# Patient Record
Sex: Female | Born: 1990 | Race: Black or African American | Hispanic: No | Marital: Single | Smoking: Never smoker
Health system: Southern US, Community
[De-identification: ages and names within clinical notes are randomized; demographics above are authoritative.]

## PROBLEM LIST (undated history)

## (undated) DIAGNOSIS — Z5189 Encounter for other specified aftercare: Secondary | ICD-10-CM

## (undated) DIAGNOSIS — D649 Anemia, unspecified: Secondary | ICD-10-CM

## (undated) DIAGNOSIS — R51 Headache: Secondary | ICD-10-CM

## (undated) DIAGNOSIS — R011 Cardiac murmur, unspecified: Secondary | ICD-10-CM

## (undated) DIAGNOSIS — H18609 Keratoconus, unspecified, unspecified eye: Secondary | ICD-10-CM

## (undated) DIAGNOSIS — R519 Headache, unspecified: Secondary | ICD-10-CM

## (undated) DIAGNOSIS — I499 Cardiac arrhythmia, unspecified: Secondary | ICD-10-CM

## (undated) DIAGNOSIS — K5190 Ulcerative colitis, unspecified, without complications: Secondary | ICD-10-CM

## (undated) DIAGNOSIS — B009 Herpesviral infection, unspecified: Secondary | ICD-10-CM

## (undated) HISTORY — DX: Cardiac murmur, unspecified: R01.1

## (undated) HISTORY — DX: Encounter for other specified aftercare: Z51.89

## (undated) HISTORY — DX: Keratoconus, unspecified, unspecified eye: H18.609

## (undated) HISTORY — PX: NO PAST SURGERIES: SHX2092

## (undated) HISTORY — PX: OTHER SURGICAL HISTORY: SHX169

---

## 2013-12-31 ENCOUNTER — Emergency Department (HOSPITAL_COMMUNITY)
Admission: EM | Admit: 2013-12-31 | Discharge: 2013-12-31 | Disposition: A | Discharge status: Home / Self Care | Payer: BC Managed Care – PPO | Source: Home / Self Care | Attending: Family Medicine | Admitting: Family Medicine

## 2013-12-31 ENCOUNTER — Encounter (HOSPITAL_COMMUNITY): Payer: Self-pay | Admitting: Emergency Medicine

## 2013-12-31 DIAGNOSIS — J039 Acute tonsillitis, unspecified: Secondary | ICD-10-CM

## 2013-12-31 LAB — POCT RAPID STREP A: Streptococcus, Group A Screen (Direct): NEGATIVE

## 2013-12-31 MED ORDER — CLINDAMYCIN HCL 300 MG PO CAPS: 300.0000 mg | ORAL_CAPSULE | Freq: Three times a day (TID) | ORAL | Status: DC

## 2013-12-31 NOTE — ED Provider Notes (Signed)
CSN: [id number]     Arrival date & time 12/31/13  1115 History   First MD Initiated Contact with Patient 12/31/13 1224     Chief Complaint  Patient presents with  . Sore Throat   (Consider location/radiation/quality/duration/timing/severity/associated sxs/prior Treatment) Patient is a 24 y.o. female presenting with pharyngitis. The history is provided by the patient.  Sore Throat This is a new problem. The current episode started more than 2 days ago. The problem has been gradually worsening. Pertinent negatives include no headaches. The symptoms are aggravated by swallowing.    History reviewed. No pertinent past medical history. History reviewed. No pertinent past surgical history. History reviewed. No pertinent family history. History  Substance Use Topics  . Smoking status: Never Smoker   . Smokeless tobacco: Not on file  . Alcohol Use: Yes   OB History   Grav Para Term Preterm Abortions TAB SAB Ect Mult Living                 Review of Systems  Constitutional: Positive for fever and chills.  HENT: Negative for congestion, postnasal drip and rhinorrhea.   Respiratory: Negative.   Cardiovascular: Negative.   Gastrointestinal: Negative.   Neurological: Negative for headaches.    Allergies  Review of patient's allergies indicates no known allergies.  Home Medications   Current Outpatient Rx  Name  Route  Sig  Dispense  Refill  . clindamycin (CLEOCIN) 300 MG capsule   Oral   Take 1 capsule (300 mg total) by mouth 3 (three) times daily.   21 capsule   0    BP 141/80  Pulse 86  Temp(Src) 98.1 F (36.7 C) (Oral)  Resp 19  SpO2 98%  LMP 12/11/2013 Physical Exam  Nursing note and vitals reviewed. Constitutional: She is oriented to person, place, and time. She appears well-developed and well-nourished.  HENT:  Head: Normocephalic.  Right Ear: External ear normal.  Left Ear: External ear normal.  Mouth/Throat: Oropharyngeal exudate present.  Right tonsillar  exudate, necrotic., foul odor.  Eyes: Conjunctivae are normal. Pupils are equal, round, and reactive to light.  Neck: Normal range of motion. Neck supple.  Cardiovascular: Regular rhythm.   Pulmonary/Chest: Breath sounds normal.  Lymphadenopathy:    She has cervical adenopathy.  Neurological: She is alert and oriented to person, place, and time.  Skin: Skin is warm and dry.    ED Course  Procedures (including critical care time) Labs Review Labs Reviewed  POCT RAPID STREP A ([facility] CARE ONLY)   Imaging Review No results found.  EKG Interpretation    Date/Time:    Ventricular Rate:    PR Interval:    QRS Duration:   QT Interval:    QTC Calculation:   R Axis:     Text Interpretation:              MDM      Billy Fischer, MD 12/31/13 1244

## 2013-12-31 NOTE — Discharge Instructions (Signed)
Drink plenty of fluids as discussed, use medicine as prescribed,  Return or see your doctor if further problems

## 2013-12-31 NOTE — ED Notes (Signed)
Pt  Reports  Symptoms  Of  sorethroat    Pain  When  She  Swallows   As   Well as   Some  Swollen glands  On the  r       Symptoms  X  5  Days    The  Pt throat   Is  Red

## 2014-01-02 LAB — CULTURE, GROUP A STREP

## 2014-01-02 NOTE — ED Notes (Signed)
Throat culture: Strep beta hemolytic not group A.  Pt. adequately treated with Cleocin. I will notify pt. Roselyn Meier 01/02/2014

## 2014-01-03 ENCOUNTER — Telehealth (HOSPITAL_COMMUNITY): Payer: Self-pay | Admitting: *Deleted

## 2014-01-03 NOTE — ED Notes (Signed)
I called pt.  Pt. verified x 2 and given results.  Pt. told she was adequately treated for Strep with Cleocin and to finish all of medication. Come back if not better after the medication. Notify anyone she exposed, that if they get the same symptoms, they should get checked for strep. Pt. voiced understanding. Roselyn Meier 01/03/2014

## 2015-11-22 ENCOUNTER — Encounter (HOSPITAL_COMMUNITY): Payer: Self-pay | Admitting: *Deleted

## 2015-11-22 ENCOUNTER — Inpatient Hospital Stay (HOSPITAL_COMMUNITY)
Admission: AD | Admit: 2015-11-22 | Discharge: 2015-11-23 | DRG: 812 | Disposition: A | Discharge status: Home / Self Care | Payer: BLUE CROSS/BLUE SHIELD | Source: Ambulatory Visit | Attending: Gastroenterology | Admitting: Gastroenterology

## 2015-11-22 DIAGNOSIS — D649 Anemia, unspecified: Secondary | ICD-10-CM | POA: Diagnosis present

## 2015-11-22 DIAGNOSIS — K921 Melena: Secondary | ICD-10-CM | POA: Diagnosis present

## 2015-11-22 HISTORY — DX: Cardiac arrhythmia, unspecified: I49.9

## 2015-11-22 HISTORY — DX: Herpesviral infection, unspecified: B00.9

## 2015-11-22 LAB — CBC
HCT: 14.9 % — ABNORMAL LOW (ref 36.0–46.0)
Hemoglobin: 4 g/dL — CL (ref 12.0–15.0)
MCH: 15.7 pg — ABNORMAL LOW (ref 26.0–34.0)
MCHC: 26.8 g/dL — ABNORMAL LOW (ref 30.0–36.0)
MCV: 58.4 fL — ABNORMAL LOW (ref 78.0–100.0)
Platelets: 173 K/uL (ref 150–400)
RBC: 2.55 MIL/uL — ABNORMAL LOW (ref 3.87–5.11)
RDW: 20.1 % — ABNORMAL HIGH (ref 11.5–15.5)
WBC: 6.6 K/uL (ref 4.0–10.5)

## 2015-11-22 LAB — PREPARE RBC (CROSSMATCH)

## 2015-11-22 MED ORDER — SODIUM CHLORIDE 0.9 % IV SOLN: Freq: Once | INTRAVENOUS | Status: DC

## 2015-11-22 MED ORDER — SODIUM CHLORIDE 0.9 % IV SOLN
INTRAVENOUS | Status: DC
  Administered 2015-11-22: 17:00:00 via INTRAVENOUS

## 2015-11-22 MED ORDER — PANTOPRAZOLE SODIUM 40 MG PO TBEC
40.0000 mg | DELAYED_RELEASE_TABLET | Freq: Every day | ORAL | Status: DC
  Administered 2015-11-22 – 2015-11-23 (×2): 40 mg via ORAL
  Filled 2015-11-22 (×2): qty 1

## 2015-11-22 MED ORDER — ACETAMINOPHEN 325 MG PO TABS
650.0000 mg | ORAL_TABLET | Freq: Once | ORAL | Status: AC
  Administered 2015-11-22: 650 mg via ORAL
  Filled 2015-11-22: qty 2

## 2015-11-22 MED ORDER — DIPHENHYDRAMINE HCL 25 MG PO CAPS
25.0000 mg | ORAL_CAPSULE | Freq: Once | ORAL | Status: AC
  Administered 2015-11-22: 25 mg via ORAL
  Filled 2015-11-22: qty 1

## 2015-11-22 NOTE — H&P (Signed)
  Carolyn Blake HPI: For the past three weeks she reports issues with melena. PO intake will stimulate a bowel movement and she had black stools that will be formed and unformed. At times she may have 3-4 melenic bowel movements per day. No significant abdominal pain, nausea, vomiting, or constipation. She reports having some diarrheal bowel movements. Since the age of 25 she reports hematochezia that was evident in the stools and on the toilet tissue. At [facility] she had blood work, but she cannot recall the results. This was performed two weeks ago. She denies taking NSAIDs or Peptobismol. Her father has Crohn's disease and a maternal aunt may have UC. Per the patient's report, her rectal examination was positive for blood. Unfortunately she was still menstruating at the time of the examination. There is no known family history of colon cancer.  Her blood work was obtained yesterday in the office and the lab called this morning reporting a value of 4.2 g/dL.  No past medical history on file.  No past surgical history on file.  No family history on file.  Social History:  reports that she has never smoked. She does not have any smokeless tobacco history on file. She reports that she drinks alcohol. Her drug history is not on file.  Allergies: No Known Allergies  Medications:  Scheduled: . sodium chloride   Intravenous Once  . acetaminophen  650 mg Oral Once  . diphenhydrAMINE  25 mg Oral Once  . pantoprazole  40 mg Oral Daily   Continuous: . sodium chloride      No results found for this or any previous visit (from the past 24 hour(s)).   No results found.  ROS:  As stated above in the HPI otherwise negative.  Blood pressure 123/73, pulse 87, resp. rate 19, SpO2 100 %.    PE: Gen: NAD, Alert and Oriented HEENT:  [place]/AT, EOMI Neck: Supple, no LAD Lungs: CTA Bilaterally CV: RRR without M/G/R ABM: Soft, NTND, +BS Ext: No C/C/E Rectal: Heme positive brown stool  (yesterday's examination in the office.)  Assessment/Plan: 1) Severe IDA. 2) Melena. 3) Hematochezia.   Surprisingly she is asymptomatic from such a low value for her HGB.  She is already set up for an EGD/Colonoscopy.  The first order of business is to transfuse her.  She will be provided 3 units of PRBC and then she can be safely discharged home.  The plan is for endoscopic procedure next week.  Plan: 1) Transfuse 3 units of PRBC.  Carolyn Blake D 11/22/2015, 3:25 PM

## 2015-11-22 NOTE — Progress Notes (Signed)
Utilization review completed.  L. J. Cyrene Gharibian RN, BSN, CM 

## 2015-11-22 NOTE — Progress Notes (Signed)
CRITICAL VALUE ALERT  Critical value received:  4.0 HGB  Date of notification: 11/23/2015  Time of notification:  1748  Critical value read back: yes Nurse who received alert:  Keirston Saephanh  MD notified (1st page):md AWARE 3 UNITS OF PBRC  Time of first page:  MD notified (2nd page):  Time of second page:  Responding MD:   Time MD responded:

## 2015-11-23 LAB — CBC
HCT: 21 % — ABNORMAL LOW (ref 36.0–46.0)
Hemoglobin: 6.4 g/dL — CL (ref 12.0–15.0)
MCH: 20.1 pg — ABNORMAL LOW (ref 26.0–34.0)
MCHC: 30.5 g/dL (ref 30.0–36.0)
MCV: 66 fL — ABNORMAL LOW (ref 78.0–100.0)
Platelets: 182 10*3/uL (ref 150–400)
RBC: 3.18 MIL/uL — ABNORMAL LOW (ref 3.87–5.11)
RDW: 25.8 % — ABNORMAL HIGH (ref 11.5–15.5)
WBC: 5.5 10*3/uL (ref 4.0–10.5)

## 2015-11-23 NOTE — Progress Notes (Signed)
Reviewed discharge paperwork.  PIV removed.  Pt denied any needs at this time.  Pt taken to discharge location via wheelchair.

## 2015-11-23 NOTE — Progress Notes (Signed)
Released blood order and received phone call from blood bank stating that the 3rd unit of blood is not ready and will have to be received from a sister hospital.  Informed Dr. Benson Norway.  Will order CBC and discharge patient if Hgb 7.0 or greater.  Will inform patient and continue to monitor patient.

## 2015-11-23 NOTE — Care Management Note (Signed)
Case Management Note  Patient Details  Name: Jullianna Gabor MRN: [id number] Date of Birth: [birth year]  Subjective/Objective:                  Spoke with patient, she states that she plans on [facility], she is a Ship broker at [employer]. She drives and is covered with BCBS. She denies any diffuculties obtaining her medications.    Action/Plan:  No further CM needs identified at this time.  Expected Discharge Date:                  Expected Discharge Plan:  Home/Self Care  In-House Referral:     Discharge planning Services  CM Consult  Post Acute Care Choice:    Choice offered to:     DME Arranged:    DME Agency:     HH Arranged:    [place] Agency:     Status of Service:  Completed, signed off  Medicare Important Message Given:    Date Medicare IM Given:    Medicare IM give by:    Date Additional Medicare IM Given:    Additional Medicare Important Message give by:     If discussed at [place] of Stay Meetings, dates discussed:    Additional Comments:  Carles Collet, RN 11/23/2015, 11:04 AM

## 2015-11-23 NOTE — Discharge Summary (Signed)
Physician Discharge Summary  Patient ID: Carolyn Blake MRN: [id number] DOB/AGE: 08-04-1990 25 y.o.  Admit date: 11/22/2015 Discharge date: 11/23/2015  Admission Diagnoses: Severe Anemia.                                         Heme positive stool.  Discharge Diagnoses: Same Active Problems:   Anemia   Discharged Condition: good  Hospital Course: The patient's HGB was at 4.2 g/dL from the office, which precipitated the admission.  Her CBC in the hospital confirmed the value at 4.0 g/dL.  She was transfused with 3 units of PRBC, but it was difficult to type and crossmatch her blood.  She had warm antibodies.  No issues with the actual transfusion.  A CBC after two units of PRBC showed an appropriate increase into the 6 range.  The patient was asymptomatic from the anemia and she did not report any improvement after the transfusion.  With her history of melena Protonix was initiated and an outpatient prescription for omeprazole was [facility] [address].  She is scheduled to have an EGD/Colonscopy this coming Tuesday.  Consults: None  Significant Diagnostic Studies: labs: CBC  Treatments: blood transfusions  Discharge Exam: Blood pressure 110/54, pulse 81, temperature 98.2 F (36.8 C), temperature source Oral, resp. rate 18, height 5' 4"  (1.626 m), weight 69.31 kg (152 lb 12.8 oz), SpO2 100 %. General appearance: alert and no distress Resp: clear to auscultation bilaterally Cardio: regular rate and rhythm GI: soft, non-tender; bowel sounds normal; no masses,  no organomegaly Extremities: extremities normal, atraumatic, no cyanosis or edema  Disposition: 01-Home or Self Care     Medication List    TAKE these medications        valACYclovir 500 MG tablet  Commonly known as:  VALTREX  Take 1 tablet by mouth 2 (two) times daily.         SignedCarol Ada D 11/23/2015, 3:34 PM

## 2015-11-24 LAB — TYPE AND SCREEN
ABO/RH(D): O POS
Antibody Screen: POSITIVE
DAT, IgG: POSITIVE
Donor AG Type: NEGATIVE
Donor AG Type: NEGATIVE
Donor AG Type: NEGATIVE
Unit division: 0
Unit division: 0
Unit division: 0
Unit division: 0

## 2015-11-26 ENCOUNTER — Encounter (HOSPITAL_COMMUNITY): Payer: Self-pay | Admitting: Neurology

## 2015-11-26 ENCOUNTER — Emergency Department (HOSPITAL_COMMUNITY): Payer: BLUE CROSS/BLUE SHIELD

## 2015-11-26 ENCOUNTER — Emergency Department (HOSPITAL_COMMUNITY)
Admission: EM | Admit: 2015-11-26 | Discharge: 2015-11-26 | Disposition: A | Discharge status: Home / Self Care | Payer: BLUE CROSS/BLUE SHIELD | Attending: Emergency Medicine | Admitting: Emergency Medicine

## 2015-11-26 DIAGNOSIS — R1013 Epigastric pain: Secondary | ICD-10-CM | POA: Diagnosis not present

## 2015-11-26 DIAGNOSIS — Z8679 Personal history of other diseases of the circulatory system: Secondary | ICD-10-CM | POA: Diagnosis not present

## 2015-11-26 DIAGNOSIS — R197 Diarrhea, unspecified: Secondary | ICD-10-CM | POA: Insufficient documentation

## 2015-11-26 DIAGNOSIS — Z3202 Encounter for pregnancy test, result negative: Secondary | ICD-10-CM | POA: Insufficient documentation

## 2015-11-26 DIAGNOSIS — R195 Other fecal abnormalities: Secondary | ICD-10-CM | POA: Insufficient documentation

## 2015-11-26 DIAGNOSIS — Z79899 Other long term (current) drug therapy: Secondary | ICD-10-CM | POA: Diagnosis not present

## 2015-11-26 DIAGNOSIS — Z8619 Personal history of other infectious and parasitic diseases: Secondary | ICD-10-CM | POA: Diagnosis not present

## 2015-11-26 DIAGNOSIS — Z862 Personal history of diseases of the blood and blood-forming organs and certain disorders involving the immune mechanism: Secondary | ICD-10-CM | POA: Diagnosis not present

## 2015-11-26 LAB — URINALYSIS, ROUTINE W REFLEX MICROSCOPIC
Bilirubin Urine: NEGATIVE
Glucose, UA: NEGATIVE mg/dL
Hgb urine dipstick: NEGATIVE
Ketones, ur: NEGATIVE mg/dL
Nitrite: NEGATIVE
Protein, ur: NEGATIVE mg/dL
Specific Gravity, Urine: 1.015 (ref 1.005–1.030)
pH: 6 (ref 5.0–8.0)

## 2015-11-26 LAB — URINE MICROSCOPIC-ADD ON: Bacteria, UA: NONE SEEN

## 2015-11-26 LAB — CBC
HCT: 24.4 % — ABNORMAL LOW (ref 36.0–46.0)
Hemoglobin: 7.5 g/dL — ABNORMAL LOW (ref 12.0–15.0)
MCH: 21.7 pg — ABNORMAL LOW (ref 26.0–34.0)
MCHC: 30.7 g/dL (ref 30.0–36.0)
MCV: 70.7 fL — ABNORMAL LOW (ref 78.0–100.0)
Platelets: 167 10*3/uL (ref 150–400)
RBC: 3.45 MIL/uL — ABNORMAL LOW (ref 3.87–5.11)
RDW: 27.9 % — ABNORMAL HIGH (ref 11.5–15.5)
WBC: 6.2 10*3/uL (ref 4.0–10.5)

## 2015-11-26 LAB — COMPREHENSIVE METABOLIC PANEL
ALT: 9 U/L — ABNORMAL LOW (ref 14–54)
AST: 15 U/L (ref 15–41)
Albumin: 2.6 g/dL — ABNORMAL LOW (ref 3.5–5.0)
Alkaline Phosphatase: 59 U/L (ref 38–126)
Anion gap: 4 — ABNORMAL LOW (ref 5–15)
BUN: <5 mg/dL — ABNORMAL LOW (ref 6–20)
CO2: 24 mmol/L (ref 22–32)
Calcium: 8.3 mg/dL — ABNORMAL LOW (ref 8.9–10.3)
Chloride: 109 mmol/L (ref 101–111)
Creatinine, Ser: 0.57 mg/dL (ref 0.44–1.00)
GFR calc Af Amer: >60 mL/min (ref >60)
GFR calc non Af Amer: >60 mL/min (ref >60)
Glucose, Bld: 87 mg/dL (ref 65–99)
Potassium: 3.3 mmol/L — ABNORMAL LOW (ref 3.5–5.1)
Sodium: 137 mmol/L (ref 135–145)
Total Bilirubin: 0.2 mg/dL — ABNORMAL LOW (ref 0.3–1.2)
Total Protein: 6 g/dL — ABNORMAL LOW (ref 6.5–8.1)

## 2015-11-26 LAB — I-STAT BETA HCG BLOOD, ED (MC, WL, AP ONLY): I-stat hCG, quantitative: <5 m[IU]/mL (ref <5)

## 2015-11-26 LAB — LIPASE, BLOOD: Lipase: 183 U/L — ABNORMAL HIGH (ref 11–51)

## 2015-11-26 MED ORDER — IOHEXOL 300 MG/ML  SOLN
100.0000 mL | Freq: Once | INTRAMUSCULAR | Status: AC | PRN
  Administered 2015-11-26: 100 mL via INTRAVENOUS

## 2015-11-26 MED ORDER — MORPHINE SULFATE (PF) 2 MG/ML IV SOLN
2.0000 mg | Freq: Once | INTRAVENOUS | Status: AC
  Administered 2015-11-26: 2 mg via INTRAVENOUS
  Filled 2015-11-26: qty 1

## 2015-11-26 MED ORDER — SODIUM CHLORIDE 0.9 % IV BOLUS (SEPSIS)
1000.0000 mL | Freq: Once | INTRAVENOUS | Status: AC
  Administered 2015-11-26: 1000 mL via INTRAVENOUS

## 2015-11-26 MED ORDER — IOHEXOL 300 MG/ML  SOLN: 25.0000 mL | INTRAMUSCULAR | Status: AC

## 2015-11-26 MED ORDER — ONDANSETRON HCL 4 MG PO TABS
4.0000 mg | ORAL_TABLET | Freq: Once | ORAL | Status: AC
  Administered 2015-11-26: 4 mg via ORAL
  Filled 2015-11-26: qty 1

## 2015-11-26 NOTE — ED Provider Notes (Signed)
CSN: [id number]     Arrival date & time 11/26/15  0840 History   First MD Initiated Contact with Patient 11/26/15 [phone]     Chief Complaint  Patient presents with  . Abdominal Pain   (Consider location/radiation/quality/duration/timing/severity/associated sxs/prior Treatment)  HPI  Patient is a 25 year old female with history of anemia who presents with 2 days of epigastric abdominal pain. Patient was admitted to the hospital 4 days ago with severe anemia and melena. She was discharged 3 days ago and schedule to have an EGD and colonoscopy with GI. Patient reports that the pain started after being discharged from the hospital. Pain is described as a sharp, crampy, burning sensation in her epigastric area that occurs after eating. Pain will last for 15-20 minutes then subside. No nausea or vomiting. Last BM yesterday. Has had chronic diarrhea that is not changed from her baseline. Patient has not tried any medications for the pain.   Past Medical History  Diagnosis Date  . Irregular heart beat   . HSV-2 (herpes simplex virus 2) infection    History reviewed. No pertinent past surgical history. No family history on file. Social History  Substance Use Topics  . Smoking status: Never Smoker   . Smokeless tobacco: None  . Alcohol Use: Yes   OB History    No data available     Review of Systems  Constitutional: Negative for fever and chills.  HENT: Negative.   Eyes: Negative.   Respiratory: Negative.   Cardiovascular: Negative.   Gastrointestinal: Positive for abdominal pain, diarrhea and blood in stool. Negative for nausea and vomiting.  Endocrine: Negative.   Genitourinary: Negative.   Musculoskeletal: Negative.   Skin: Negative.   Allergic/Immunologic: Negative.   Neurological: Negative.   Hematological: Negative.   Psychiatric/Behavioral: Negative.    Allergies  Review of patient's allergies indicates no known allergies.  Home Medications   Prior to Admission  medications   Medication Sig Start Date End Date Taking? Authorizing Provider  omeprazole (PRILOSEC) 40 MG capsule Take 40 mg by mouth daily. 11/23/15   Historical Provider, MD  valACYclovir (VALTREX) 500 MG tablet Take 1 tablet by mouth 2 (two) times daily. 10/28/15   Historical Provider, MD   BP 125/84 mmHg  Pulse 56  Temp(Src) 98.2 F (36.8 C) (Oral)  Resp 18  SpO2 100%  LMP 10/03/2015 Physical Exam  Constitutional: She is oriented to person, place, and time. She appears well-developed and well-nourished. No distress.  HENT:  Head: Normocephalic and atraumatic.  Eyes: EOM are normal. Pupils are equal, round, and reactive to light.  Neck: Normal range of motion. Neck supple.  Cardiovascular: Normal rate and regular rhythm.   No murmur heard. Pulmonary/Chest: Effort normal and breath sounds normal. No respiratory distress.  Abdominal: Soft. Bowel sounds are normal. She exhibits no distension and no mass. There is no tenderness. There is no rebound and no guarding.  Musculoskeletal: Normal range of motion. She exhibits no edema or tenderness.  Neurological: She is alert and oriented to person, place, and time. No cranial nerve deficit.  Skin: Skin is warm and dry. No erythema.  Psychiatric: She has a normal mood and affect. Her behavior is normal.  Nursing note and vitals reviewed.   ED Course  Procedures (including critical care time) Labs Review Labs Reviewed  LIPASE, BLOOD - Abnormal; Notable for the following:    Lipase 183 (*)    All other components within normal limits  COMPREHENSIVE METABOLIC PANEL - Abnormal; Notable  for the following:    Potassium 3.3 (*)    BUN <5 (*)    Calcium 8.3 (*)    Total Protein 6.0 (*)    Albumin 2.6 (*)    ALT 9 (*)    Total Bilirubin 0.2 (*)    Anion gap 4 (*)    All other components within normal limits  CBC - Abnormal; Notable for the following:    RBC 3.45 (*)    Hemoglobin 7.5 (*)    HCT 24.4 (*)    MCV 70.7 (*)    MCH 21.7  (*)    RDW 27.9 (*)    All other components within normal limits  URINALYSIS, ROUTINE W REFLEX MICROSCOPIC (NOT AT [facility]) - Abnormal; Notable for the following:    Leukocytes, UA TRACE (*)    All other components within normal limits  URINE MICROSCOPIC-ADD ON - Abnormal; Notable for the following:    Squamous Epithelial / LPF 0-5 (*)    All other components within normal limits  I-STAT BETA HCG BLOOD, ED ([facility], [facility], [facility] ONLY)    Imaging Review Ct Abdomen Pelvis W Contrast  11/26/2015  CLINICAL DATA:  Mid to upper abdominal pain 2 days. Pain with eating and bowel movements. EXAM: CT ABDOMEN AND PELVIS WITH CONTRAST TECHNIQUE: Multidetector CT imaging of the abdomen and pelvis was performed using the standard protocol following bolus administration of intravenous contrast. CONTRAST:  191m OMNIPAQUE IOHEXOL 300 MG/ML  SOLN COMPARISON:  None. FINDINGS: Lung bases demonstrate minimal dependent atelectatic change. Abdominal images demonstrate mild periportal edema. Small amount of perihepatic fluid. Possible subtle gallbladder wall thickening likely secondary to adjacent mild ascites. The spleen, pancreas, adrenal glands, stomach and kidneys are normal. Two small adjacent splenules. Appendix is normal. Small amount of free fluid in the pericolic gutters bilaterally. Mild fecal retention throughout the colon. Small bowel is within normal. Pelvic images demonstrate the bladder, uterus and ovaries to be within normal. Minimal fluid in the cul-de-sac. Mild fecal retention over the rectum. Subtle subcutaneous edema over the abdomen/pelvis. Remaining bones and soft tissues are within normal. IMPRESSION: Minimal ascites. Possible subtle gallbladder wall thickening likely secondary to the mild ascites. Mild nonspecific periportal edema. Electronically Signed   By: DMarin OlpM.D.   On: 11/26/2015 12:12   [place]Abdomen Limited Ruq  11/26/2015  CLINICAL DATA:  Epigastric pain EXAM: [place]DOMEN LIMITED - RIGHT UPPER  QUADRANT COMPARISON:  None. FINDINGS: Gallbladder: No gallstones are noted within gallbladder. No thickening of gallbladder wall. Small pericholecystic fluid. No sonographic Murphy's sign. Common bile duct: Diameter: 1.2 mm in diameter within normal limits. Liver: No focal lesion identified. Within normal limits in parenchymal echogenicity. IMPRESSION: No gallstones are noted within gallbladder. Normal CBD. No thickening of gallbladder wall. Small pericholecystic fluid. Electronically Signed   By: LLahoma CrockerM.D.   On: 11/26/2015 12:49   I have personally reviewed and evaluated these images and lab results as part of my medical decision-making.   EKG Interpretation None      MDM   Final diagnoses:  Epigastric pain   Patient is a 25year old female presenting with 2 days of epigastric pain after eating. Patient has a benign abdominal exam. Work up notable for elevated lipase to 183. CT abdomen showed normal pancreas with minimal ascites. RUQ ultrasound with normal gallbladder wall and no gallstones. Patient without further episodes of pain in the ED. Vital signs within normal limits. Unclear etiology for patient's epigastric pain and elevated lipase,  though possibly stress reaction from recent admission for profound anemia. Patient may have peptic ulcer which could also contribute to her epigastric pain. Patient has follow up with GI in 3 days for EGD and colonoscopy. Instructed her to follow up with her PCP and GI if she continues to have abdominal pain. Will discharge home. Return precautions reviewed.    Vivi Barrack, MD 11/26/15 1326  Carmin Muskrat, MD 11/26/15 [phone]

## 2015-11-26 NOTE — ED Notes (Signed)
Pt reports mid abd pain for 2 days that hurts when she eats and has BM. Was admitted to the hospital earlier this week, thinks she may have ulcer waiting for colonoscopy.

## 2015-11-26 NOTE — Discharge Instructions (Signed)

## 2016-01-16 ENCOUNTER — Ambulatory Visit: Payer: Self-pay | Admitting: Gastroenterology

## 2016-02-10 IMAGING — CT CT ABD-PELV W/ CM
2 of 4 series · 11 of 46 positions shown, 12 images · IV contrast (Iodine)
Comparison: None.

CLINICAL DATA: Mid to upper abdominal pain 2 days. Pain with eating
and bowel movements.

EXAM:
CT ABDOMEN AND PELVIS WITH CONTRAST
TECHNIQUE: Multidetector CT imaging of the abdomen and pelvis was performed
using the standard protocol following bolus administration of
intravenous contrast.
CONTRAST:  100mL OMNIPAQUE IOHEXOL 300 MG/ML  SOLN

[Series 201: routine, idose (2) · axial · 0.78mm/px · z∈[-665,-265]mm · 8 of 96 slices shown, 9 images]
[im 8/96  soft-tissue]
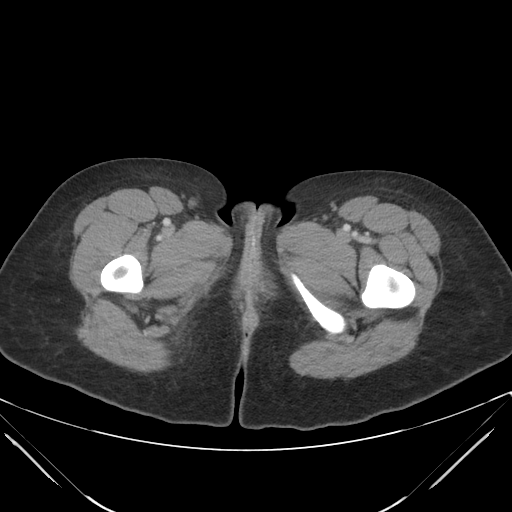
[im 8/96  bone]
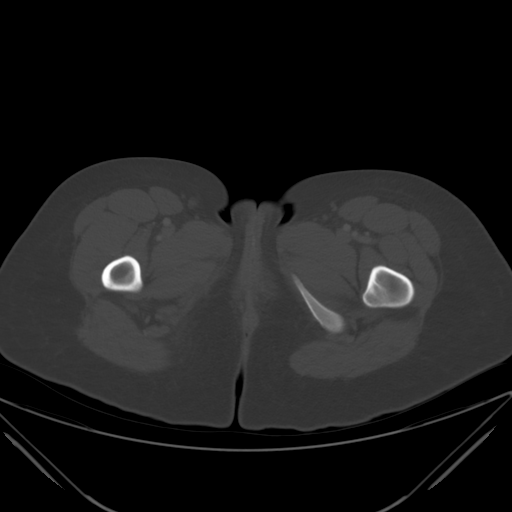
[im 20/96  soft-tissue]
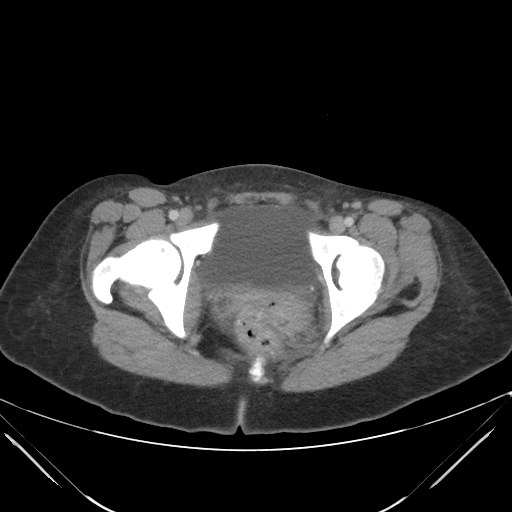
[im 31/96  soft-tissue]
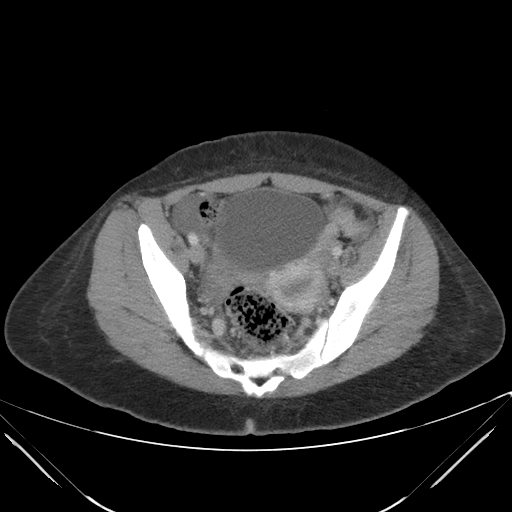
[im 42/96  soft-tissue]
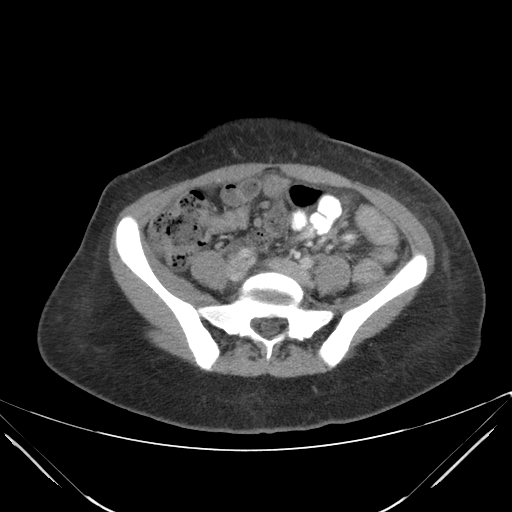
[im 54/96  soft-tissue]
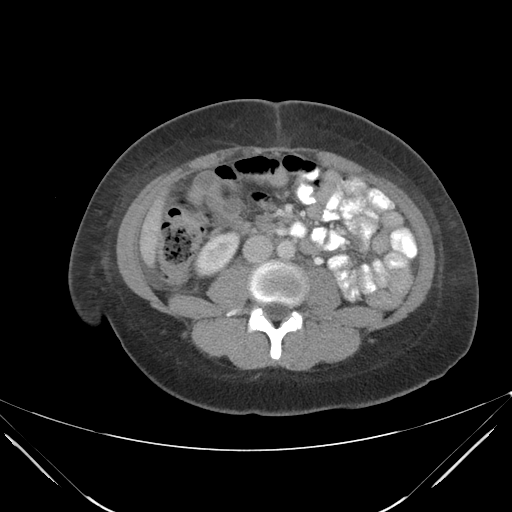
[im 65/96  soft-tissue]
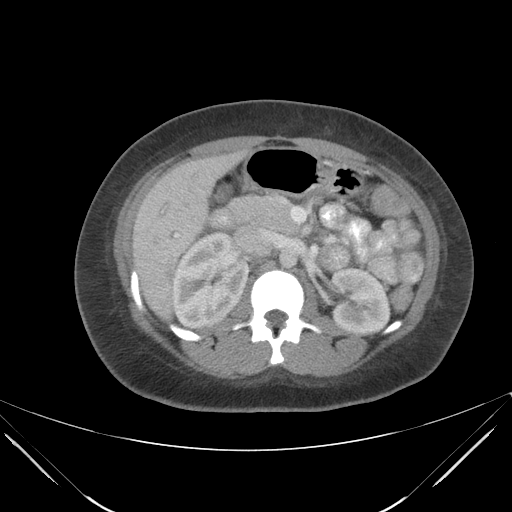
[im 77/96  soft-tissue]
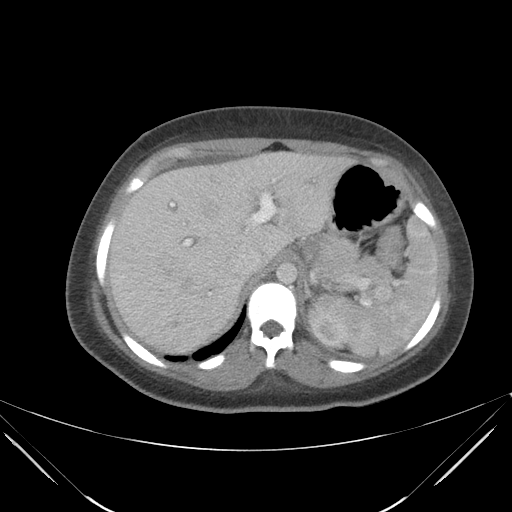
[im 88/96  soft-tissue]
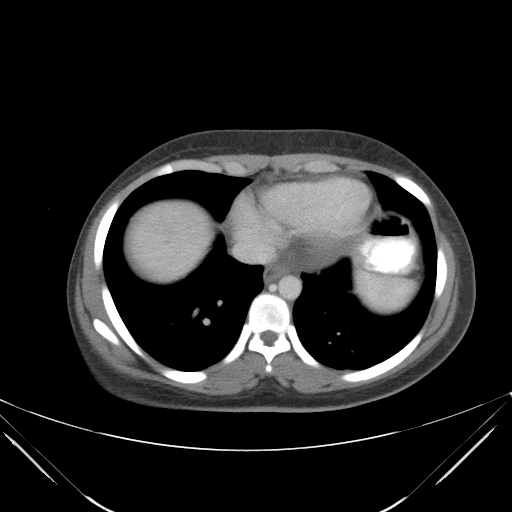

[Series 203: coronals, idose (2) · coronal · 0.45mm/px · 3 of 119 slices shown]
[im 40/119  soft-tissue]
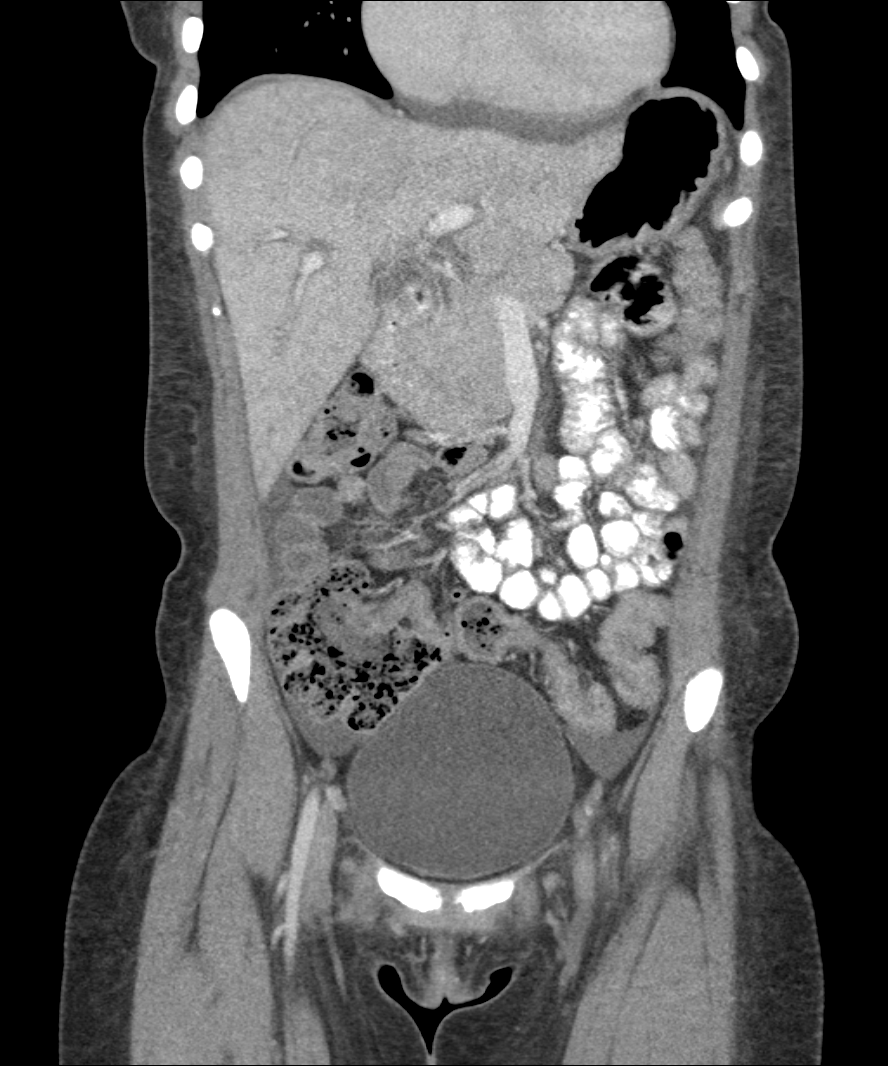
[im 53/119  soft-tissue]
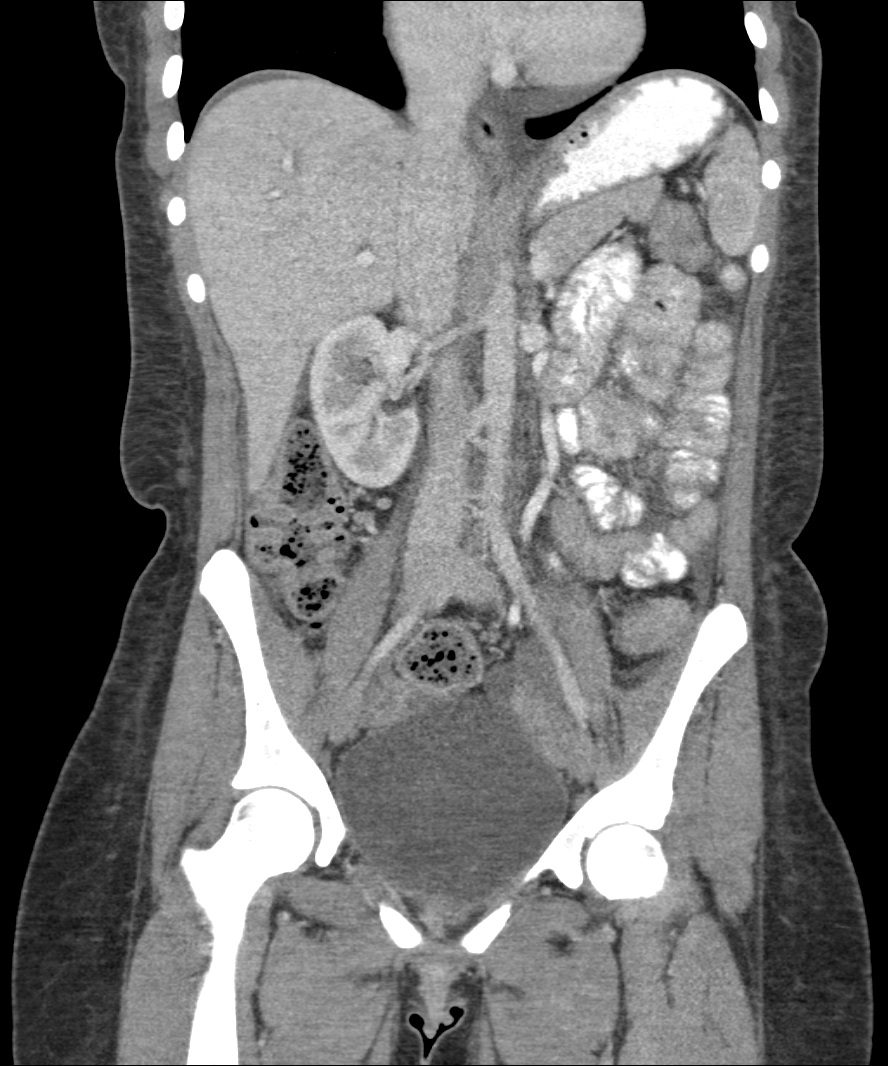
[im 66/119  soft-tissue]
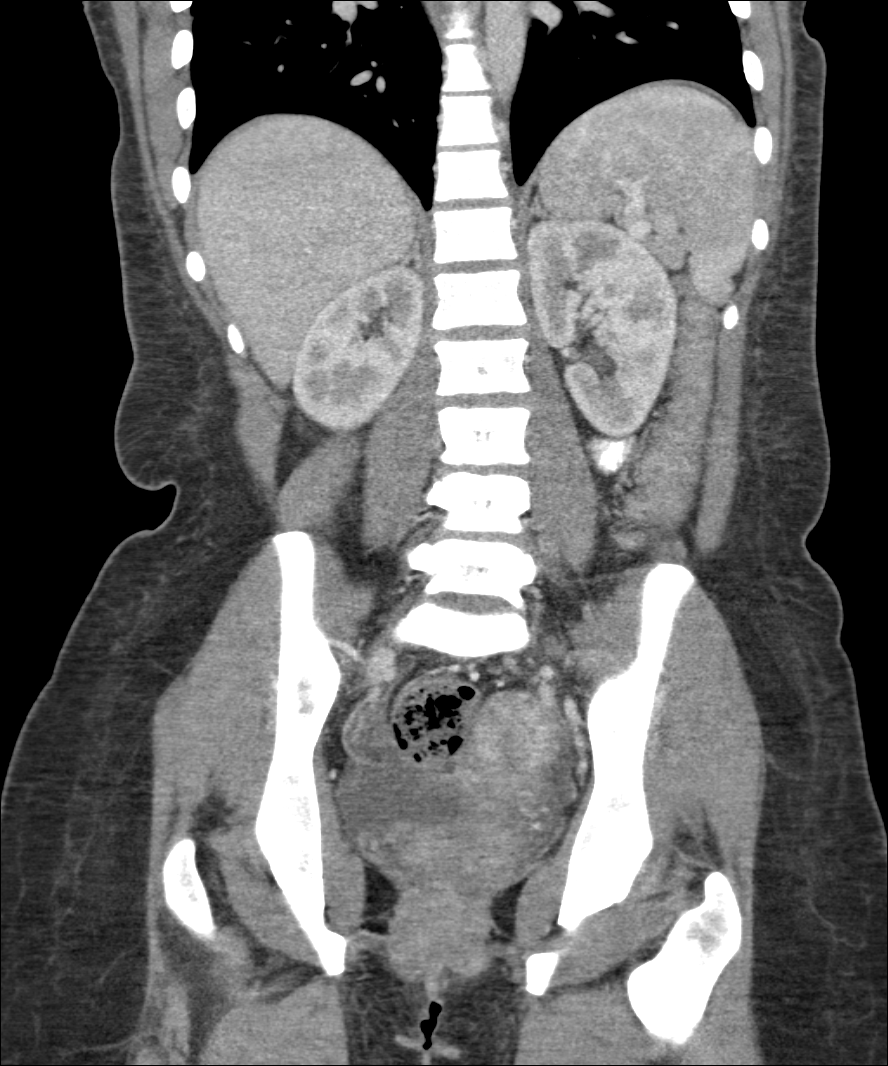

[11 of 46 positions shown; findings below may reference images not displayed]

FINDINGS: Lung bases demonstrate minimal dependent atelectatic change.

Abdominal images demonstrate mild periportal edema. Small amount of
perihepatic fluid. Possible subtle gallbladder wall thickening
likely secondary to adjacent mild ascites.

The spleen, pancreas, adrenal glands, stomach and kidneys are
normal. Two small adjacent splenules. Appendix is normal.

Small amount of free fluid in the pericolic gutters bilaterally.
Mild fecal retention throughout the colon. Small bowel is within
normal.

Pelvic images demonstrate the bladder, uterus and ovaries to be
within normal. Minimal fluid in the cul-de-sac. Mild fecal retention
over the rectum. Subtle subcutaneous edema over the abdomen/pelvis.
Remaining bones and soft tissues are within normal.
IMPRESSION: Minimal ascites. Possible subtle gallbladder wall thickening likely
secondary to the mild ascites.

Mild nonspecific periportal edema.

## 2016-04-19 ENCOUNTER — Encounter (HOSPITAL_COMMUNITY): Payer: Self-pay | Admitting: *Deleted

## 2016-04-19 ENCOUNTER — Emergency Department (HOSPITAL_COMMUNITY)
Admission: EM | Admit: 2016-04-19 | Discharge: 2016-04-19 | Disposition: A | Discharge status: Home / Self Care | Payer: BLUE CROSS/BLUE SHIELD | Attending: Emergency Medicine | Admitting: Emergency Medicine

## 2016-04-19 DIAGNOSIS — Z8679 Personal history of other diseases of the circulatory system: Secondary | ICD-10-CM | POA: Insufficient documentation

## 2016-04-19 DIAGNOSIS — Z79899 Other long term (current) drug therapy: Secondary | ICD-10-CM | POA: Insufficient documentation

## 2016-04-19 DIAGNOSIS — R195 Other fecal abnormalities: Secondary | ICD-10-CM | POA: Diagnosis present

## 2016-04-19 DIAGNOSIS — K51819 Other ulcerative colitis with unspecified complications: Secondary | ICD-10-CM | POA: Insufficient documentation

## 2016-04-19 DIAGNOSIS — D649 Anemia, unspecified: Secondary | ICD-10-CM | POA: Diagnosis not present

## 2016-04-19 DIAGNOSIS — D638 Anemia in other chronic diseases classified elsewhere: Secondary | ICD-10-CM

## 2016-04-19 DIAGNOSIS — Z8619 Personal history of other infectious and parasitic diseases: Secondary | ICD-10-CM | POA: Diagnosis not present

## 2016-04-19 HISTORY — DX: Ulcerative colitis, unspecified, without complications: K51.90

## 2016-04-19 LAB — CBC
HCT: 24.1 % — ABNORMAL LOW (ref 36.0–46.0)
Hemoglobin: 7.5 g/dL — ABNORMAL LOW (ref 12.0–15.0)
MCH: 20.9 pg — ABNORMAL LOW (ref 26.0–34.0)
MCHC: 31.1 g/dL (ref 30.0–36.0)
MCV: 67.3 fL — ABNORMAL LOW (ref 78.0–100.0)
Platelets: 252 10*3/uL (ref 150–400)
RBC: 3.58 MIL/uL — ABNORMAL LOW (ref 3.87–5.11)
RDW: 17.7 % — ABNORMAL HIGH (ref 11.5–15.5)
WBC: 5.1 10*3/uL (ref 4.0–10.5)

## 2016-04-19 LAB — COMPREHENSIVE METABOLIC PANEL
ALT: 13 U/L — ABNORMAL LOW (ref 14–54)
AST: 22 U/L (ref 15–41)
Albumin: 3 g/dL — ABNORMAL LOW (ref 3.5–5.0)
Alkaline Phosphatase: 69 U/L (ref 38–126)
Anion gap: 10 (ref 5–15)
BUN: <5 mg/dL — ABNORMAL LOW (ref 6–20)
CO2: 23 mmol/L (ref 22–32)
Calcium: 9 mg/dL (ref 8.9–10.3)
Chloride: 108 mmol/L (ref 101–111)
Creatinine, Ser: 0.59 mg/dL (ref 0.44–1.00)
GFR calc Af Amer: >60 mL/min (ref >60)
GFR calc non Af Amer: >60 mL/min (ref >60)
Glucose, Bld: 90 mg/dL (ref 65–99)
Potassium: 3.6 mmol/L (ref 3.5–5.1)
Sodium: 141 mmol/L (ref 135–145)
Total Bilirubin: 0.2 mg/dL — ABNORMAL LOW (ref 0.3–1.2)
Total Protein: 6.9 g/dL (ref 6.5–8.1)

## 2016-04-19 NOTE — ED Notes (Signed)
Pt states that she has noticed blood in her stool for 3-4 weeks, started feeling week this week and went to her PCP and had blood work done. Her Hgb was 7.8 and was told to come to ED. Pt has hx of ulcerative colitis.

## 2016-04-19 NOTE — ED Provider Notes (Signed)
CSN: [id number]     Arrival date & time 04/19/16  1611 History   First MD Initiated Contact with Patient 04/19/16 1740     Chief Complaint  Patient presents with  . Abnormal Lab  . GI Bleeding      HPI Pt states that she has noticed blood in her stool for 3-4 weeks, started feeling week this week and went to her PCP and had blood work done. Her Hgb was 7.8 and was told to come to ED. Pt has hx of ulcerative colitis.Patient had transfusion back in November but her hemoglobin at that time was found to 4.0.  Her discharge hemoglobin was 7.6.  She has had some increased fatigue but nothing severe.  She sees Dr. Carol Ada. Past Medical History  Diagnosis Date  . Irregular heart beat   . HSV-2 (herpes simplex virus 2) infection   . Ulcerative colitis ([place])    History reviewed. No pertinent past surgical history. No family history on file. Social History  Substance Use Topics  . Smoking status: Never Smoker   . Smokeless tobacco: None  . Alcohol Use: Yes   OB History    No data available     Review of Systems  Gastrointestinal: Positive for blood in stool.  All other systems reviewed and are negative.     Allergies  Review of patient's allergies indicates no known allergies.  Home Medications   Prior to Admission medications   Medication Sig Start Date End Date Taking? Authorizing Provider  omeprazole (PRILOSEC) 40 MG capsule Take 40 mg by mouth daily. 11/23/15   Historical Provider, MD  valACYclovir (VALTREX) 500 MG tablet Take 1 tablet by mouth 2 (two) times daily. 10/28/15   Historical Provider, MD   BP 125/86 mmHg  Pulse 84  Resp 14  SpO2 100%  LMP 04/05/2016 Physical Exam  Constitutional: She is oriented to person, place, and time. She appears well-developed and well-nourished. No distress.  HENT:  Head: Normocephalic and atraumatic.  Eyes: Pupils are equal, round, and reactive to light.  Neck: Normal range of motion.  Cardiovascular: Normal rate and intact  distal pulses.   Pulmonary/Chest: No respiratory distress.  Abdominal: Soft. Normal appearance and bowel sounds are normal. She exhibits no distension. There is no tenderness.  Musculoskeletal: Normal range of motion.  Neurological: She is alert and oriented to person, place, and time. No cranial nerve deficit.  Skin: Skin is warm and dry. No rash noted.  Psychiatric: She has a normal mood and affect. Her behavior is normal.  Nursing note and vitals reviewed.   ED Course  Procedures (including critical care time) Labs Review Labs Reviewed  COMPREHENSIVE METABOLIC PANEL - Abnormal; Notable for the following:    BUN <5 (*)    Albumin 3.0 (*)    ALT 13 (*)    Total Bilirubin 0.2 (*)    All other components within normal limits  CBC - Abnormal; Notable for the following:    RBC 3.58 (*)    Hemoglobin 7.5 (*)    HCT 24.1 (*)    MCV 67.3 (*)    MCH 20.9 (*)    RDW 17.7 (*)    All other components within normal limits  POC OCCULT BLOOD, ED  TYPE AND SCREEN    Imaging Review No results found. I have personally reviewed and evaluated these images and lab results as part of my medical decision-making.  I contacted Dr. Benson Norway and discussed the case with him.  At  this time no need for transfusion.  Patient is stable.  Dr. Benson Norway will see her in the office either tomorrow or Monday.  She is to call the office tomorrow for instructions.  Patient appears stable for discharge.  MDM   Final diagnoses:  Other ulcerative colitis with complication ([place])  Anemia in chronic illness        Leonard Schwartz, MD 04/19/16 1840

## 2016-04-19 NOTE — ED Notes (Signed)
Patient able to ambulate independently  

## 2016-04-19 NOTE — ED Notes (Signed)
Pt verbalized understanding of all discharge instructions/follow-up.  E-signature pad in room not working at this time.  All questions answered.

## 2016-04-19 NOTE — ED Notes (Signed)
C/o generalized fatigue x 1 week and went to health center to "have levels checked".

## 2016-04-19 NOTE — Discharge Instructions (Signed)
Colitis Colitis is inflammation of the colon. Colitis may last a short time (acute) or it may last a long time (chronic). CAUSES This condition may be caused by:  Viruses.  Bacteria.  Reactions to medicine.  Certain autoimmune diseases, such as Crohn disease or ulcerative colitis. SYMPTOMS Symptoms of this condition include:  Diarrhea.  Passing bloody or tarry stool.  Pain.  Fever.  Vomiting.  Tiredness (fatigue).  Weight loss.  Bloating.  Sudden increase in abdominal pain.  Having fewer bowel movements than usual. DIAGNOSIS This condition is diagnosed with a stool test or a blood test. You may also have other tests, including X-rays, a CT scan, or a colonoscopy. TREATMENT Treatment may include:  Resting the bowel. This involves not eating or drinking for a period of time.  Fluids that are given through an IV tube.  Medicine for pain and diarrhea.  Antibiotic medicines.  Cortisone medicines.  Surgery. HOME CARE INSTRUCTIONS Eating and Drinking  Follow instructions from your health care provider about eating or drinking restrictions.  Drink enough fluid to keep your urine clear or pale yellow.  Work with a dietitian to determine which foods cause your condition to flare up.  Avoid foods that cause flare-ups.  Eat a well-balanced diet. Medicines  Take over-the-counter and prescription medicines only as told by your health care provider.  If you were prescribed an antibiotic medicine, take it as told by your health care provider. Do not stop taking the antibiotic even if you start to feel better. General Instructions  Keep all follow-up visits as told by your health care provider. This is important. SEEK MEDICAL CARE IF:  Your symptoms do not go away.  You develop new symptoms. SEEK IMMEDIATE MEDICAL CARE IF:  You have a fever that does not go away with treatment.  You develop chills.  You have extreme weakness, fainting, or  dehydration.  You have repeated vomiting.  You develop severe pain in your abdomen.  You pass bloody or tarry stool.   This information is not intended to replace advice given to you by your health care provider. Make sure you discuss any questions you have with your health care provider.   Document Released: 01/24/2005 Document Revised: 09/07/2015 Document Reviewed: 04/11/2015 Elsevier Interactive Patient Education [employer].

## 2016-04-20 LAB — TYPE AND SCREEN
ABO/RH(D): O POS
Antibody Screen: POSITIVE
DAT, IgG: POSITIVE
Unit division: 0
Unit division: 0

## 2016-07-16 IMAGING — US US ABDOMEN LIMITED
1 series · 14 of 25 positions shown · non-contrast
Comparison: None.

CLINICAL DATA: Epigastric pain

EXAM:
US ABDOMEN LIMITED - RIGHT UPPER QUADRANT

[Series 1: us abdomen limited · 0.18mm/px · 14 of 29 slices shown]
[im 1/29]
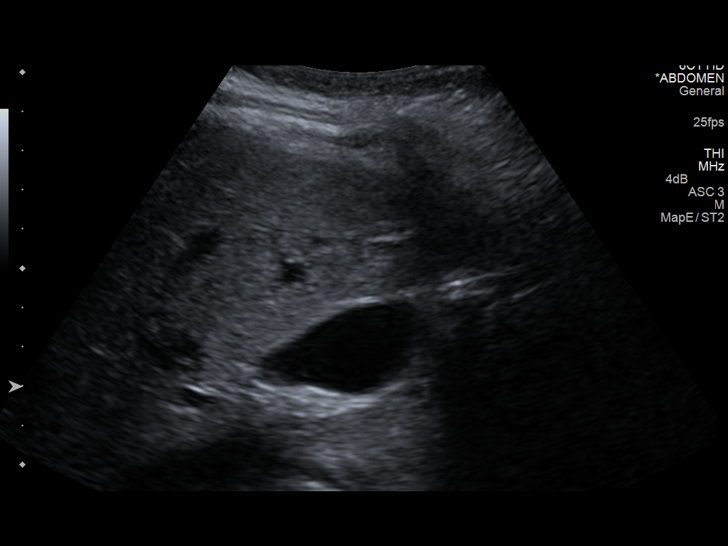
[im 3/29]
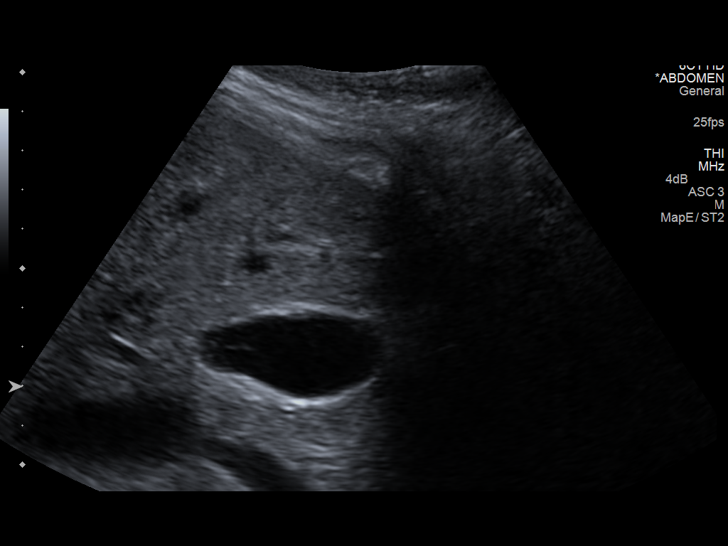
[im 5/29]
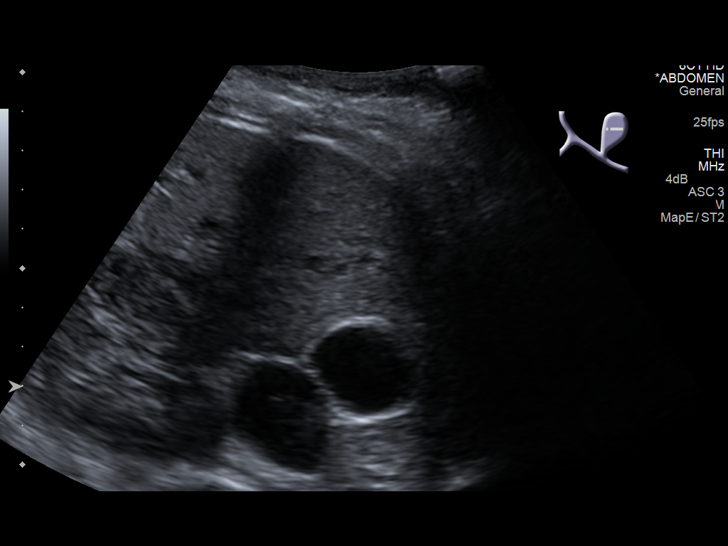
[im 8/29]
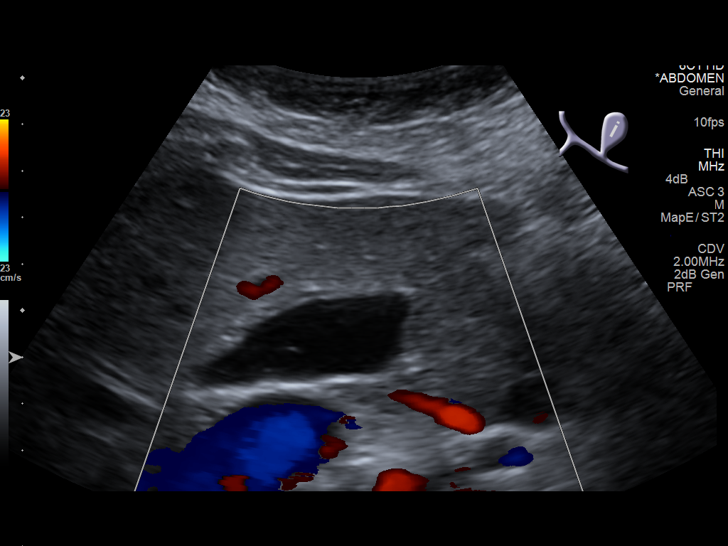
[im 10/29]
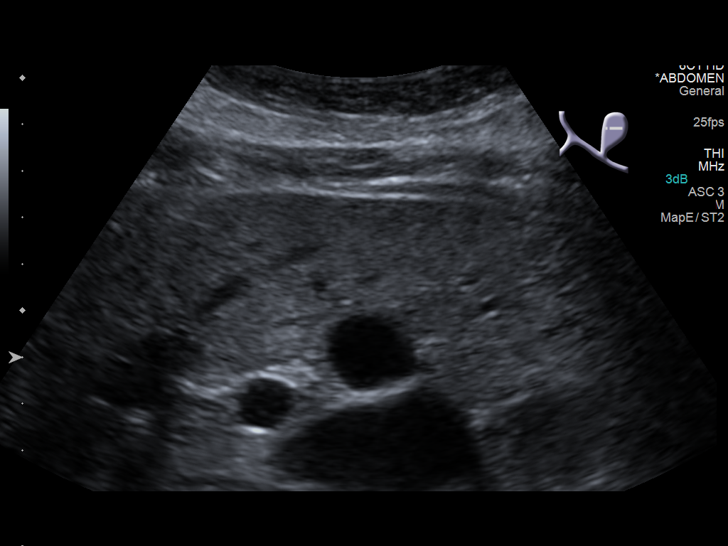
[im 11/29]
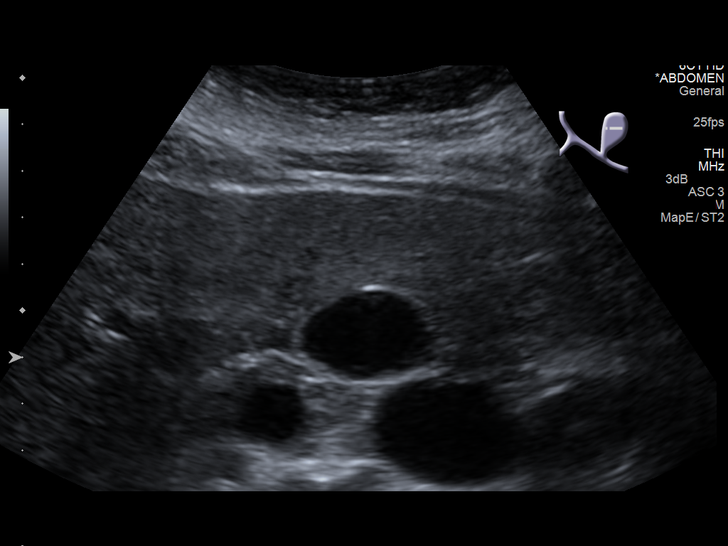
[im 13/29]
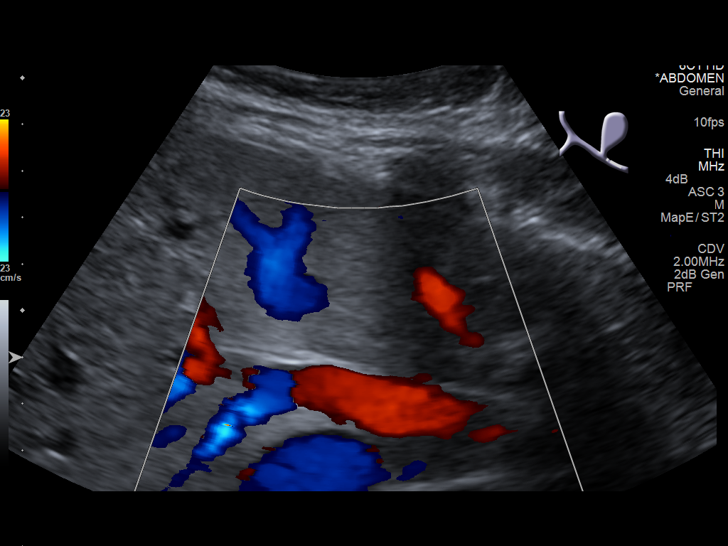
[im 16/29]
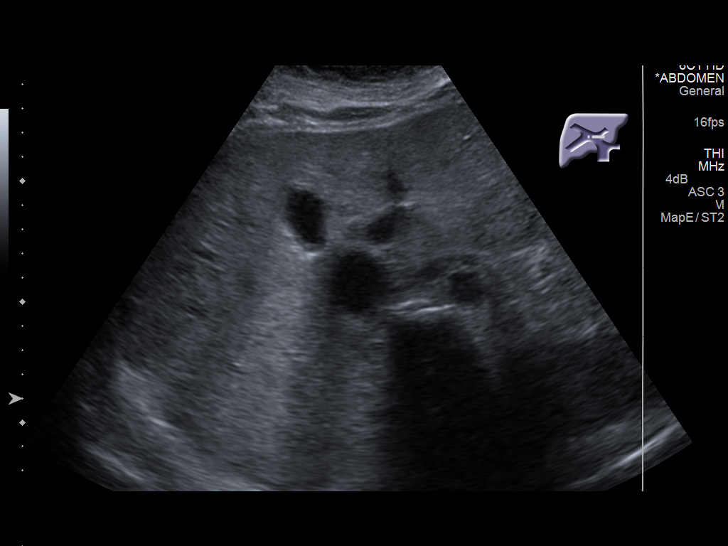
[im 18/29]
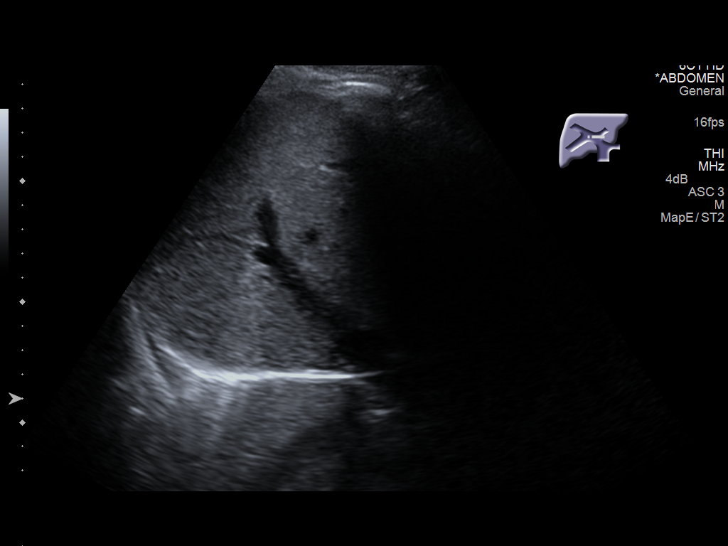
[im 19/29]
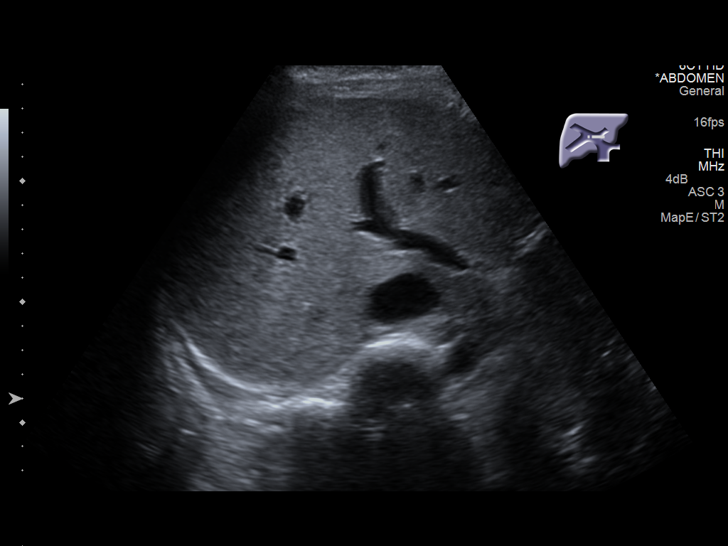
[im 22/29]
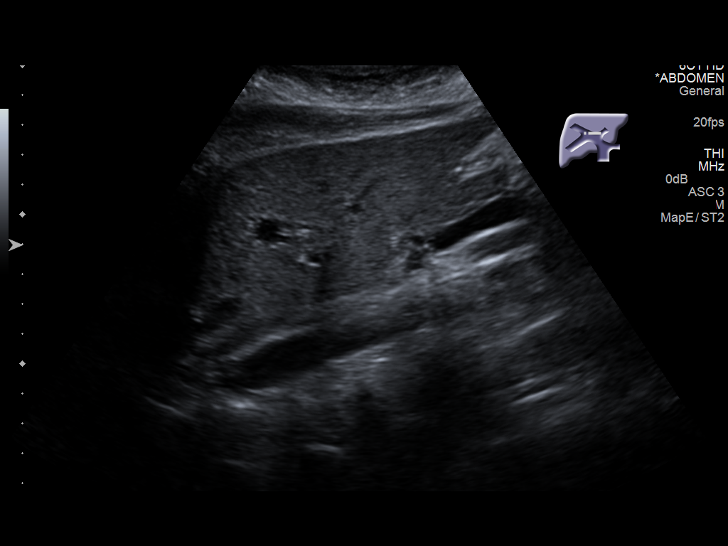
[im 24/29]
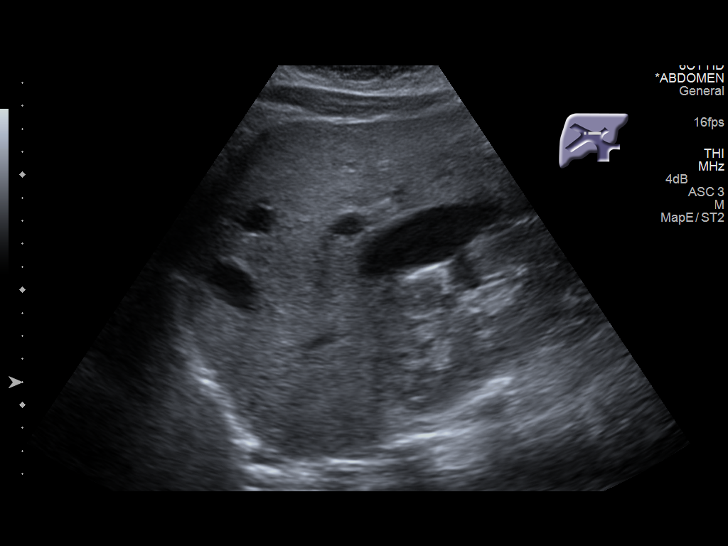
[im 26/29]
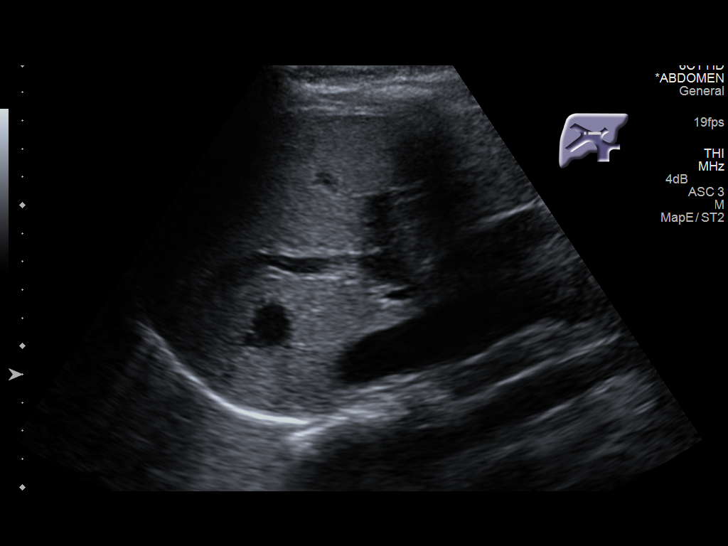
[im 29/29]
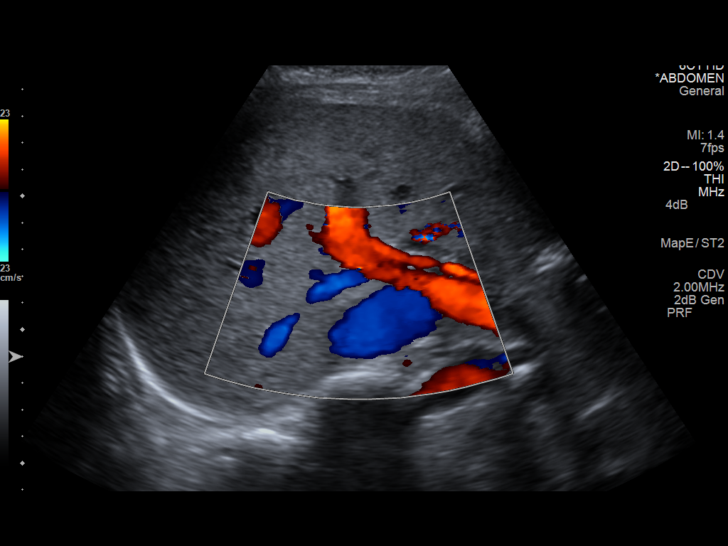

[14 of 25 positions shown; findings below may reference images not displayed]

FINDINGS: Gallbladder:

No gallstones are noted within gallbladder. No thickening of
gallbladder wall. Small pericholecystic fluid. No sonographic
Murphy's sign.

Common bile duct:

Diameter: 1.2 mm in diameter within normal limits.

Liver:

No focal lesion identified. Within normal limits in parenchymal
echogenicity.
IMPRESSION: No gallstones are noted within gallbladder. Normal CBD. No
thickening of gallbladder wall. Small pericholecystic fluid.

## 2016-08-26 ENCOUNTER — Emergency Department (HOSPITAL_COMMUNITY)
Admission: EM | Admit: 2016-08-26 | Discharge: 2016-08-26 | Disposition: A | Discharge status: Home / Self Care | Payer: Medicaid Other | Attending: Emergency Medicine | Admitting: Emergency Medicine

## 2016-08-26 ENCOUNTER — Encounter (HOSPITAL_COMMUNITY): Payer: Self-pay | Admitting: Vascular Surgery

## 2016-08-26 DIAGNOSIS — Z3A09 9 weeks gestation of pregnancy: Secondary | ICD-10-CM | POA: Diagnosis not present

## 2016-08-26 DIAGNOSIS — O99611 Diseases of the digestive system complicating pregnancy, first trimester: Secondary | ICD-10-CM | POA: Diagnosis not present

## 2016-08-26 DIAGNOSIS — K51911 Ulcerative colitis, unspecified with rectal bleeding: Secondary | ICD-10-CM | POA: Diagnosis not present

## 2016-08-26 DIAGNOSIS — O26891 Other specified pregnancy related conditions, first trimester: Secondary | ICD-10-CM | POA: Diagnosis present

## 2016-08-26 LAB — COMPREHENSIVE METABOLIC PANEL
ALT: 12 U/L — ABNORMAL LOW (ref 14–54)
AST: 18 U/L (ref 15–41)
Albumin: 3.2 g/dL — ABNORMAL LOW (ref 3.5–5.0)
Alkaline Phosphatase: 82 U/L (ref 38–126)
Anion gap: 4 — ABNORMAL LOW (ref 5–15)
BUN: 5 mg/dL — ABNORMAL LOW (ref 6–20)
CO2: 22 mmol/L (ref 22–32)
Calcium: 8.6 mg/dL — ABNORMAL LOW (ref 8.9–10.3)
Chloride: 107 mmol/L (ref 101–111)
Creatinine, Ser: 0.5 mg/dL (ref 0.44–1.00)
GFR calc Af Amer: >60 mL/min (ref >60)
GFR calc non Af Amer: >60 mL/min (ref >60)
Glucose, Bld: 83 mg/dL (ref 65–99)
Potassium: 4 mmol/L (ref 3.5–5.1)
Sodium: 133 mmol/L — ABNORMAL LOW (ref 135–145)
Total Bilirubin: 0.3 mg/dL (ref 0.3–1.2)
Total Protein: 6.5 g/dL (ref 6.5–8.1)

## 2016-08-26 LAB — URINALYSIS, ROUTINE W REFLEX MICROSCOPIC
Bilirubin Urine: NEGATIVE
Glucose, UA: NEGATIVE mg/dL
Hgb urine dipstick: NEGATIVE
Ketones, ur: NEGATIVE mg/dL
Nitrite: NEGATIVE
Protein, ur: NEGATIVE mg/dL
Specific Gravity, Urine: 1.018 (ref 1.005–1.030)
pH: 5 (ref 5.0–8.0)

## 2016-08-26 LAB — CBC
HCT: 31.7 % — ABNORMAL LOW (ref 36.0–46.0)
Hemoglobin: 10.3 g/dL — ABNORMAL LOW (ref 12.0–15.0)
MCH: 26.4 pg (ref 26.0–34.0)
MCHC: 32.5 g/dL (ref 30.0–36.0)
MCV: 81.3 fL (ref 78.0–100.0)
Platelets: 264 10*3/uL (ref 150–400)
RBC: 3.9 MIL/uL (ref 3.87–5.11)
RDW: 16.2 % — ABNORMAL HIGH (ref 11.5–15.5)
WBC: 9 10*3/uL (ref 4.0–10.5)

## 2016-08-26 LAB — URINE MICROSCOPIC-ADD ON

## 2016-08-26 LAB — HCG, QUANTITATIVE, PREGNANCY: hCG, Beta Chain, Quant, S: 71800 m[IU]/mL — ABNORMAL HIGH (ref <5)

## 2016-08-26 LAB — LIPASE, BLOOD: Lipase: 26 U/L (ref 11–51)

## 2016-08-26 NOTE — ED Triage Notes (Signed)
Pt reports to the ED for eval of abd pain and bloody diarrhea. She is approx [redacted] weeks pregnant. She has hx of ulcerative colitis. Pt denies any vaginal bleeding, d/c, or urinary symptoms. Pt A&Ox4, resp e/u, and skin warm and dry.

## 2016-08-26 NOTE — ED Provider Notes (Signed)
[facility] DEPT Provider Note   CSN: [id number] Arrival date & time: 08/26/16  0126  By signing my name below, I, Irene Pap, attest that this documentation has been prepared under the direction and in the presence of Deno Etienne, DO. Electronically Signed: Irene Pap, ED Scribe. 08/26/16. 1:58 AM.  History   Chief Complaint Chief Complaint  Patient presents with  . Abdominal Pain   The history is provided by the patient. No language interpreter was used.  Abdominal Pain   This is a new problem. The current episode started more than 1 week ago. The problem occurs constantly. The problem has been gradually worsening. The pain is moderate. Associated symptoms include diarrhea and hematochezia. Pertinent negatives include fever, nausea, vomiting, dysuria, frequency, headaches, arthralgias and myalgias. Her past medical history is significant for ulcerative colitis.   HPI Comments: Carolyn Blake is a G1 26 y.o. Female who is [redacted] weeks pregnant with a hx of ulcerative colitis who presents to the Emergency Department complaining of gradually worsening, occasional abdominal pain onset one week ago. Pt reports associated bloody diarrhea. She states that the abdominal pain occurs when she is having a bowel movement. Pt states that she came in tonight because her ulcerative colitis is flaring up and has lost 5 pounds in the past week. She has not tried anything for her symptoms. She denies fever, chills, nausea, vomiting, vaginal bleeding, vaginal discharge, dysuria, frequency, or lightheadedness.   Past Medical History:  Diagnosis Date  . HSV-2 (herpes simplex virus 2) infection   . Irregular heart beat   . Ulcerative colitis [facility])     Patient Active Problem List   Diagnosis Date Noted  . Anemia 11/22/2015    History reviewed. No pertinent surgical history.  OB History    Gravida Para Term Preterm AB Living   1             SAB TAB Ectopic Multiple Live Births                     Home Medications    Prior to Admission medications   Medication Sig Start Date End Date Taking? Authorizing Provider  Multiple Vitamin (MULTIVITAMIN WITH MINERALS) TABS tablet Take 1 tablet by mouth daily.   Yes Historical Provider, MD  Prenatal Vit-Fe Fumarate-FA (PRENATAL MULTIVITAMIN) TABS tablet Take 1 tablet by mouth daily at 12 noon.   Yes Historical Provider, MD    Family History No family history on file.  Social History Social History  Substance Use Topics  . Smoking status: Never Smoker  . Smokeless tobacco: Never Used  . Alcohol use Yes     Allergies   Review of patient's allergies indicates no known allergies.   Review of Systems Review of Systems  Constitutional: Negative for chills and fever.  HENT: Negative for congestion and rhinorrhea.   Eyes: Negative for redness and visual disturbance.  Respiratory: Negative for shortness of breath and wheezing.   Cardiovascular: Negative for chest pain and palpitations.  Gastrointestinal: Positive for abdominal pain, blood in stool, diarrhea and hematochezia. Negative for nausea and vomiting.  Genitourinary: Negative for dysuria, frequency and urgency.  Musculoskeletal: Negative for arthralgias and myalgias.  Skin: Negative for pallor and wound.  Neurological: Negative for dizziness and headaches.     Physical Exam Updated Vital Signs BP 105/60   Pulse 75   Temp 98.3 F (36.8 C) (Oral)   Resp 16   LMP 04/05/2016   SpO2 100%   Physical Exam  Constitutional: She is oriented to person, place, and time. She appears well-developed and well-nourished. No distress.  HENT:  Head: Normocephalic and atraumatic.  Eyes: EOM are normal. Pupils are equal, round, and reactive to light.  Neck: Normal range of motion. Neck supple.  Cardiovascular: Normal rate and regular rhythm.  Exam reveals no gallop and no friction rub.   No murmur heard. Pulmonary/Chest: Effort normal. She has no wheezes. She has no rales.   Abdominal: Soft. She exhibits no distension. There is no tenderness.  Musculoskeletal: She exhibits no edema or tenderness.  Neurological: She is alert and oriented to person, place, and time.  Skin: Skin is warm and dry. She is not diaphoretic.  Psychiatric: She has a normal mood and affect. Her behavior is normal.  Nursing note and vitals reviewed.    ED Treatments / Results  DIAGNOSTIC STUDIES: Oxygen Saturation is 100% on RA, normal by my interpretation.    COORDINATION OF CARE: 1:55 AM-Discussed treatment plan which includes labs with pt at bedside and pt agreed to plan.    Labs (all labs ordered are listed, but only abnormal results are displayed) Labs Reviewed  COMPREHENSIVE METABOLIC PANEL - Abnormal; Notable for the following:       Result Value   Sodium 133 (*)    BUN 5 (*)    Calcium 8.6 (*)    Albumin 3.2 (*)    ALT 12 (*)    Anion gap 4 (*)    All other components within normal limits  CBC - Abnormal; Notable for the following:    Hemoglobin 10.3 (*)    HCT 31.7 (*)    RDW 16.2 (*)    All other components within normal limits  URINALYSIS, ROUTINE W REFLEX MICROSCOPIC (NOT AT [facility]) - Abnormal; Notable for the following:    Leukocytes, UA MODERATE (*)    All other components within normal limits  HCG, QUANTITATIVE, PREGNANCY - Abnormal; Notable for the following:    hCG, Beta Chain, Quant, S 71,800 (*)    All other components within normal limits  URINE MICROSCOPIC-ADD ON - Abnormal; Notable for the following:    Squamous Epithelial / LPF 0-5 (*)    Bacteria, UA FEW (*)    All other components within normal limits  LIPASE, BLOOD    EKG  EKG Interpretation None       Radiology No results found.  Procedures Procedures (including critical care time)  Medications Ordered in ED Medications - No data to display   Initial Impression / Assessment and Plan / ED Course  I have reviewed the triage vital signs and the nursing notes.  Pertinent labs  & imaging results that were available during my care of the patient were reviewed by me and considered in my medical decision making (see chart for details).  Clinical Course    26 yo F with UC flare.  Currently pregnant.  Discussed possible therapies, shared decision making at bedside, will not start steroids with possibility of cleft palate.  Patient well appearing, non toxic, afebrile, no abdominal tenderness, doubt complication.    6:10 AM:  I have discussed the diagnosis/risks/treatment options with the patient and believe the pt to be eligible for discharge home to follow-up with PCP. We also discussed returning to the ED immediately if new or worsening sx occur. We discussed the sx which are most concerning (e.g., sudden worsening pain, fever, inability to tolerate by mouth) that necessitate immediate return. Medications administered to the patient during their  visit and any new prescriptions provided to the patient are listed below.  Medications given during this visit Medications - No data to display   The patient appears reasonably screen and/or stabilized for discharge and I doubt any other medical condition or other [facility] requiring further screening, evaluation, or treatment in the ED at this time prior to discharge.    Final Clinical Impressions(s) / ED Diagnoses   Final diagnoses:  Ulcerative colitis with rectal bleeding, unspecified location [facility])  I personally performed the services described in this documentation, which was scribed in my presence. The recorded information has been reviewed and is accurate.    New Prescriptions Discharge Medication List as of 08/26/2016  2:54 AM       Deno Etienne, DO 08/26/16 3419

## 2016-08-26 NOTE — Discharge Instructions (Signed)
Tylenol is the only agreed upon safe medication in pregnancy.  Discuss this with your gastroenterologist to see what they recommend at this stage of pregnancy.

## 2016-09-17 ENCOUNTER — Other Ambulatory Visit (HOSPITAL_COMMUNITY): Payer: Self-pay | Admitting: Nurse Practitioner

## 2016-09-17 DIAGNOSIS — Z3A12 12 weeks gestation of pregnancy: Secondary | ICD-10-CM

## 2016-09-17 DIAGNOSIS — O351XX0 Maternal care for (suspected) chromosomal abnormality in fetus, not applicable or unspecified: Secondary | ICD-10-CM

## 2016-09-17 DIAGNOSIS — IMO0002 Reserved for concepts with insufficient information to code with codable children: Secondary | ICD-10-CM

## 2016-09-17 LAB — OB RESULTS CONSOLE RUBELLA ANTIBODY, IGM: Rubella: IMMUNE

## 2016-09-17 LAB — OB RESULTS CONSOLE HGB/HCT, BLOOD
HCT: 33 %
Hemoglobin: 10.7 g/dL

## 2016-09-17 LAB — CYSTIC FIBROSIS DIAGNOSTIC STUDY: Interpretation-CFDNA:: NEGATIVE

## 2016-09-17 LAB — OB RESULTS CONSOLE VARICELLA ZOSTER ANTIBODY, IGG: Varicella: IMMUNE

## 2016-09-17 LAB — OB RESULTS CONSOLE RPR: RPR: NONREACTIVE

## 2016-09-17 LAB — OB RESULTS CONSOLE ABO/RH: RH Type: POSITIVE

## 2016-09-17 LAB — OB RESULTS CONSOLE HIV ANTIBODY (ROUTINE TESTING): HIV: NONREACTIVE

## 2016-09-17 LAB — OB RESULTS CONSOLE GC/CHLAMYDIA
Chlamydia: POSITIVE
Gonorrhea: NEGATIVE

## 2016-09-17 LAB — CYTOLOGY - PAP: Pap: NEGATIVE

## 2016-09-17 LAB — GLUCOSE TOLERANCE, 1 HOUR: Glucose, 1 Hour GTT: 47

## 2016-09-17 LAB — SICKLE CELL SCREEN

## 2016-09-17 LAB — OB RESULTS CONSOLE PLATELET COUNT: Platelets: 221 10*3/uL

## 2016-09-17 LAB — OB RESULTS CONSOLE ANTIBODY SCREEN: Antibody Screen: NEGATIVE

## 2016-09-25 ENCOUNTER — Encounter (HOSPITAL_COMMUNITY): Payer: Self-pay | Admitting: Nurse Practitioner

## 2016-09-28 ENCOUNTER — Ambulatory Visit (HOSPITAL_COMMUNITY): Payer: BLUE CROSS/BLUE SHIELD

## 2016-10-02 ENCOUNTER — Ambulatory Visit (HOSPITAL_COMMUNITY): Payer: BLUE CROSS/BLUE SHIELD

## 2016-10-02 ENCOUNTER — Ambulatory Visit (HOSPITAL_COMMUNITY)
Admission: RE | Admit: 2016-10-02 | Discharge: 2016-10-02 | Disposition: A | Discharge status: Home / Self Care | Payer: BLUE CROSS/BLUE SHIELD | Source: Ambulatory Visit | Attending: Nurse Practitioner | Admitting: Nurse Practitioner

## 2016-10-02 ENCOUNTER — Encounter (HOSPITAL_COMMUNITY): Payer: Self-pay

## 2016-10-03 ENCOUNTER — Encounter: Payer: Self-pay | Admitting: Obstetrics and Gynecology

## 2016-10-03 ENCOUNTER — Ambulatory Visit (INDEPENDENT_AMBULATORY_CARE_PROVIDER_SITE_OTHER): Payer: Medicaid Other | Admitting: Obstetrics and Gynecology

## 2016-10-03 VITALS — BP 127/62 | HR 83 | Wt 152.0 lb

## 2016-10-03 DIAGNOSIS — D573 Sickle-cell trait: Secondary | ICD-10-CM

## 2016-10-03 DIAGNOSIS — O0990 Supervision of high risk pregnancy, unspecified, unspecified trimester: Secondary | ICD-10-CM | POA: Insufficient documentation

## 2016-10-03 DIAGNOSIS — K5100 Ulcerative (chronic) pancolitis without complications: Secondary | ICD-10-CM

## 2016-10-03 DIAGNOSIS — Z23 Encounter for immunization: Secondary | ICD-10-CM | POA: Diagnosis not present

## 2016-10-03 DIAGNOSIS — O99011 Anemia complicating pregnancy, first trimester: Secondary | ICD-10-CM | POA: Diagnosis not present

## 2016-10-03 DIAGNOSIS — Z8619 Personal history of other infectious and parasitic diseases: Secondary | ICD-10-CM

## 2016-10-03 DIAGNOSIS — D649 Anemia, unspecified: Secondary | ICD-10-CM | POA: Diagnosis not present

## 2016-10-03 DIAGNOSIS — O0991 Supervision of high risk pregnancy, unspecified, first trimester: Secondary | ICD-10-CM

## 2016-10-03 DIAGNOSIS — K5190 Ulcerative colitis, unspecified, without complications: Secondary | ICD-10-CM | POA: Insufficient documentation

## 2016-10-03 HISTORY — DX: Sickle-cell trait: D57.3

## 2016-10-03 HISTORY — DX: Personal history of other infectious and parasitic diseases: Z86.19

## 2016-10-03 LAB — POCT URINALYSIS DIP (DEVICE)
Bilirubin Urine: NEGATIVE
Glucose, UA: NEGATIVE mg/dL
Hgb urine dipstick: NEGATIVE
Leukocytes, UA: NEGATIVE
Nitrite: NEGATIVE
Protein, ur: NEGATIVE mg/dL
Specific Gravity, Urine: 1.025 (ref 1.005–1.030)
Urobilinogen, UA: 0.2 mg/dL (ref 0.0–1.0)
pH: 5.5 (ref 5.0–8.0)

## 2016-10-03 NOTE — Addendum Note (Signed)
Addended by: Shelly Coss on: 10/03/2016 03:48 PM   Modules accepted: Orders

## 2016-10-03 NOTE — Progress Notes (Signed)
Subjective:  Carolyn Blake is a 26 y.o. G1P0000 at 73w3dbeing [facility]  She is currently monitored for the following issues for this high-risk pregnancy and has Anemia; Supervision of high risk pregnancy, antepartum; Ulcerative colitis (HNew [place]; History of PCR DNA positive for HSV2; and Sickle cell trait (HMargaretville on her problem list.  Patient reports no complaints.  Contractions: Not present. Vag. Bleeding: None.  Movement: Absent. Denies leaking of fluid.   The following portions of the patient's history were reviewed and updated as appropriate: allergies, current medications, past family history, past medical history, past social history, past surgical history and problem list. Problem list updated.  Objective:   Vitals:   10/03/16 1444  BP: 127/62  Pulse: 83  Weight: 152 lb (68.9 kg)    Fetal Status: Fetal Heart Rate (bpm): 160   Movement: Absent     General:  Alert, oriented and cooperative. Patient is in no acute distress.  Skin: Skin is warm and dry. No rash noted.   Cardiovascular: Normal heart rate noted  Respiratory: Normal respiratory effort, no problems with respiration noted  Abdomen: Soft, gravid, appropriate for gestational age. Pain/Pressure: Absent     Pelvic:  Cervical exam deferred        Extremities: Normal range of motion.  Edema: None  Mental Status: Normal mood and affect. Normal behavior. Normal judgment and thought content.   Urinalysis:      Assessment and Plan:  Pregnancy: G1P0000 at 125w3d1. Supervision of high risk pregnancy in first trimester [facility] need ay next visit - USKoreaFM Fetal Nuchal Translucency; Future - Flu Vaccine QUAD 36+ mos IM (Fluarix, Quad PF)  2. Supervision of high risk pregnancy, antepartum   3. Ulcerative pancolitis without complication (HCParker SchoolReviewed UC and pregnancy with pt. DX in 11/16, previously  on Mesalamine but stopped with Dx of pregnancy. She has a flare up this past August, the only one since Dx of Uc. Has seen Dr. PeSharee Holstern the past but unable to see due to past due bill. Will refer to GI for eval   4. Anemia, unspecified type Continue with PNV at this time  5. Sickle cell trait (HCFillmoreAdvised to find out status of FOB  6. History of PCR DNA positive for HSV2 Begin suppressive therapy at 36 weeks  Preterm labor symptoms and general obstetric precautions including but not limited to vaginal bleeding, contractions, leaking of fluid and fetal movement were reviewed in detail with the patient. Please refer to After Visit Summary for other counseling recommendations.  Return in about 4 weeks (around 10/31/2016) for OB visit.   MiChancy MilroyMD

## 2016-10-05 ENCOUNTER — Encounter: Payer: Self-pay | Admitting: *Deleted

## 2016-10-09 ENCOUNTER — Encounter (HOSPITAL_COMMUNITY): Payer: Self-pay

## 2016-10-09 ENCOUNTER — Ambulatory Visit (HOSPITAL_COMMUNITY)
Admission: RE | Admit: 2016-10-09 | Discharge: 2016-10-09 | Disposition: A | Discharge status: Home / Self Care | Payer: Medicaid Other | Source: Ambulatory Visit | Attending: Obstetrics and Gynecology | Admitting: Obstetrics and Gynecology

## 2016-10-09 DIAGNOSIS — Z3A13 13 weeks gestation of pregnancy: Secondary | ICD-10-CM | POA: Insufficient documentation

## 2016-10-09 DIAGNOSIS — O0991 Supervision of high risk pregnancy, unspecified, first trimester: Secondary | ICD-10-CM

## 2016-10-09 DIAGNOSIS — Z3682 Encounter for antenatal screening for nuchal translucency: Secondary | ICD-10-CM | POA: Diagnosis present

## 2016-10-18 ENCOUNTER — Other Ambulatory Visit (HOSPITAL_COMMUNITY): Payer: Self-pay

## 2016-10-31 ENCOUNTER — Encounter: Payer: Self-pay | Admitting: Obstetrics and Gynecology

## 2016-10-31 ENCOUNTER — Ambulatory Visit (INDEPENDENT_AMBULATORY_CARE_PROVIDER_SITE_OTHER): Payer: Medicaid Other | Admitting: Obstetrics and Gynecology

## 2016-10-31 ENCOUNTER — Other Ambulatory Visit (HOSPITAL_COMMUNITY)
Admission: RE | Admit: 2016-10-31 | Discharge: 2016-10-31 | Disposition: A | Discharge status: Home / Self Care | Payer: Medicaid Other | Source: Ambulatory Visit | Attending: Obstetrics and Gynecology | Admitting: Obstetrics and Gynecology

## 2016-10-31 VITALS — BP 129/76 | HR 77 | Wt 155.0 lb

## 2016-10-31 DIAGNOSIS — O0990 Supervision of high risk pregnancy, unspecified, unspecified trimester: Secondary | ICD-10-CM

## 2016-10-31 DIAGNOSIS — O99012 Anemia complicating pregnancy, second trimester: Secondary | ICD-10-CM

## 2016-10-31 DIAGNOSIS — K5180 Other ulcerative colitis without complications: Secondary | ICD-10-CM

## 2016-10-31 DIAGNOSIS — Z3492 Encounter for supervision of normal pregnancy, unspecified, second trimester: Secondary | ICD-10-CM

## 2016-10-31 DIAGNOSIS — Z8619 Personal history of other infectious and parasitic diseases: Secondary | ICD-10-CM

## 2016-10-31 DIAGNOSIS — Z113 Encounter for screening for infections with a predominantly sexual mode of transmission: Secondary | ICD-10-CM | POA: Insufficient documentation

## 2016-10-31 DIAGNOSIS — D573 Sickle-cell trait: Secondary | ICD-10-CM

## 2016-10-31 HISTORY — DX: Personal history of other infectious and parasitic diseases: Z86.19

## 2016-10-31 NOTE — Progress Notes (Signed)
Prenatal Visit Note Date: 10/31/2016 Clinic: [facility]  Subjective:  Carolyn Blake is a 26 y.o. G1P0000 at 60w3dbeing seen today for ongoing prenatal care.  She is currently monitored for the following issues for this low-risk pregnancy and has Anemia; Supervision of high risk pregnancy, antepartum; Ulcerative colitis (HStannards; History of PCR DNA positive for HSV2; Sickle cell trait (HCathedral; and History of sexually transmitted disease (CT and trich) on her problem list.  Patient reports no complaints.   Contractions: Not present. Vag. Bleeding: None.  Movement: Absent. Denies leaking of fluid.   The following portions of the patient's history were reviewed and updated as appropriate: allergies, current medications, past family history, past medical history, past social history, past surgical history and problem list. Problem list updated.  Objective:   Vitals:   10/31/16 1100  BP: 129/76  Pulse: 77  Weight: 155 lb (70.3 kg)    Fetal Status: Fetal Heart Rate (bpm): 163   Movement: Absent     General:  Alert, oriented and cooperative. Patient is in no acute distress.  Skin: Skin is warm and dry. No rash noted.   Cardiovascular: Normal heart rate noted  Respiratory: Normal respiratory effort, no problems with respiration noted  Abdomen: Soft, gravid, appropriate for gestational age. Pain/Pressure: Absent     Pelvic:  Cervical exam deferred        Extremities: Normal range of motion.  Edema: Trace  Mental Status: Normal mood and affect. Normal behavior. Normal judgment and thought content.   Urinalysis:      Assessment and Plan:  Pregnancy: G1P0000 at 128w3d1. Encounter for supervision of normal pregnancy in second trimester, unspecified gravidity Routine care. Pt okay for afp today. Anatomy scan scheduled for 3-4wks - USKoreaFM OB COMP + 14 WK; Future - Alpha fetoprotein, maternal  2. Other ulcerative colitis without complication (HCCucumberPt states she is  almost done filling out forms for GI referral application. No flares since last visit. I told her that mesalamine is fine to take with pregnancy  4. Sickle cell trait (HCRolling HillsPt already aware that we recommend FOB testing qtrimester ucx can be done at next visit  5. History of sexually transmitted disease (CT and trich) TOC done today  6. History of PCR DNA positive for HSV2 ppx at 34-36wks  Preterm labor symptoms and general obstetric precautions including but not limited to vaginal bleeding, contractions, leaking of fluid and fetal movement were reviewed in detail with the patient. Please refer to After Visit Summary for other counseling recommendations.  Return in about 4 weeks (around 11/28/2016).   ChAletha HalimMD

## 2016-10-31 NOTE — Addendum Note (Signed)
Addended by: Samuel Germany on: 10/31/2016 05:02 PM   Modules accepted: Orders

## 2016-11-01 LAB — ALPHA FETOPROTEIN, MATERNAL
AFP: 34.1 ng/mL
Curr Gest Age: 16.4 weeks
MoM for AFP: 1.01
Open Spina bifida: NEGATIVE
Osb Risk: 1:12700 {titer}

## 2016-11-02 LAB — GC/CHLAMYDIA PROBE AMP (~~LOC~~) NOT AT ARMC
Chlamydia: NEGATIVE
Neisseria Gonorrhea: NEGATIVE

## 2016-11-21 ENCOUNTER — Ambulatory Visit (HOSPITAL_COMMUNITY)
Admission: RE | Admit: 2016-11-21 | Discharge: 2016-11-21 | Disposition: A | Discharge status: Home / Self Care | Payer: Medicaid Other | Source: Ambulatory Visit | Attending: Obstetrics and Gynecology | Admitting: Obstetrics and Gynecology

## 2016-11-21 DIAGNOSIS — Z3A19 19 weeks gestation of pregnancy: Secondary | ICD-10-CM | POA: Insufficient documentation

## 2016-11-21 DIAGNOSIS — Z3492 Encounter for supervision of normal pregnancy, unspecified, second trimester: Secondary | ICD-10-CM | POA: Diagnosis not present

## 2016-11-21 DIAGNOSIS — Z363 Encounter for antenatal screening for malformations: Secondary | ICD-10-CM | POA: Insufficient documentation

## 2016-11-28 ENCOUNTER — Ambulatory Visit (INDEPENDENT_AMBULATORY_CARE_PROVIDER_SITE_OTHER): Payer: Medicaid Other | Admitting: Advanced Practice Midwife

## 2016-11-28 VITALS — BP 119/62 | HR 88 | Wt 164.1 lb

## 2016-11-28 DIAGNOSIS — Z8619 Personal history of other infectious and parasitic diseases: Secondary | ICD-10-CM

## 2016-11-28 DIAGNOSIS — O0990 Supervision of high risk pregnancy, unspecified, unspecified trimester: Secondary | ICD-10-CM

## 2016-11-28 DIAGNOSIS — O0992 Supervision of high risk pregnancy, unspecified, second trimester: Secondary | ICD-10-CM

## 2016-11-28 NOTE — Progress Notes (Signed)
Instructed patient on self swab wet prep, due to missing result from last visit

## 2016-11-28 NOTE — Progress Notes (Signed)
   PRENATAL VISIT NOTE  Subjective:  Carolyn Blake is a 26 y.o. G1P0000 at 70w3dbeing seen today for ongoing prenatal care.  She is currently monitored for the following issues for this low-risk pregnancy and has Anemia; Supervision of high risk pregnancy, antepartum; Ulcerative colitis (HBlod[place]; History of PCR DNA positive for HSV2; Sickle cell trait (HBull Run Mountain Estates; and History of sexually transmitted disease (CT and trich) on her problem list.  Patient reports no complaints.  Contractions: Not present. Vag. Bleeding: None.  Movement: Absent. Denies leaking of fluid.   The following portions of the patient's history were reviewed and updated as appropriate: allergies, current medications, past family history, past medical history, past social history, past surgical history and problem list. Problem list updated.  Objective:   Vitals:   11/28/16 1513  BP: 119/62  Pulse: 88  Weight: 164 lb 1.6 oz (74.4 kg)    Fetal Status: Fetal Heart Rate (bpm): 150   Movement: Absent     General:  Alert, oriented and cooperative. Patient is in no acute distress.  Skin: Skin is warm and dry. No rash noted.   Cardiovascular: Normal heart rate noted  Respiratory: Normal respiratory effort, no problems with respiration noted  Abdomen: Soft, gravid, appropriate for gestational age. Pain/Pressure: Present     Pelvic:  Cervical exam deferred        Extremities: Normal range of motion.  Edema: Trace  Mental Status: Normal mood and affect. Normal behavior. Normal judgment and thought content.   Assessment and Plan:  Pregnancy: G1P0000 at 232w3d1. Supervision of high risk pregnancy, antepartum      Doing well  2. History of PCR DNA positive for HSV2      Prophylaxis at 34 weeks  3. History of sexually transmitted disease (CT and trich)      Repeat wet prep today - Wet prep, genital  Preterm labor symptoms and general obstetric precautions including but not limited to vaginal bleeding, contractions, leaking  of fluid and fetal movement were reviewed in detail with the patient. Please refer to After Visit Summary for other counseling recommendations.  Return in about 4 weeks (around 12/26/2016) [facility]  MaSeabron SpatesCNM

## 2016-11-28 NOTE — Patient Instructions (Signed)
AREA PEDIATRIC/FAMILY [place] [address] [place], [place]  [address] Phone - [phone]   Fax - [phone]  [facility] [address] [place], [place] [address] Phone - [phone]   Fax - [place] [address] [place], [place]  [address] Phone - [phone]   Fax - [phone]  [place] [address], [place] [address] [place], [place]  [address] Phone - [phone]   Fax - [phone]  [place] [address] [place], [place]  [address] Phone - [phone]   Fax - [phone]  [facility] [address] [place], [place]  [address] Phone - [phone]   Fax - [place] [address] [place], [place]  [address] Phone - [phone]   Fax - [phone]  [place] [address] [place], [place] [address] [place], [place]  [address] Phone - [phone]   Fax - [place] [address] [place], [place]  [address] Phone - [phone]   Fax - [phone] [facility] [place]. [address] [place], [place]  [address] Phone - [phone]   Fax - [phone]  [place] [address], [place]  [address] Phone - [phone]   Fax - [phone]  [facility] AT [place], [place], [place]  [address] Phone - [phone]   Fax - [place] [address] [place], [place]  [address] Phone - [phone]   Fax - [phone]  [facility] [address], [place], [place]  [address] Phone - [place], [place]  [address] Phone - [phone]   Fax - [place] [address], [place], [place]  [address] Phone - [phone]   Fax - [phone]  [place] [address] [place], [place]  [address] Phone - [phone]   Fax - [place]. [place], [place]  [address] Phone - [phone]   Fax - [place], [place], [place]  [address] Phone - [phone]   Fax - [place] [address], [place], [place]  [address] Phone - [phone]   Fax - [phone]  [place] [address] [place], [place]  [address] Phone - [phone]   Fax - [place] [address] [place], [place]  [address] Phone - [phone]   Fax - [phone]  [place] [address] [place], [place]  [address] Phone - [phone]   Fax - [phone] [place] [address] [place], [place]  [address] Phone - [phone]   Fax - [place] [address] [place], [place]  [address] Phone - [phone]   Fax - [place] [address], [place],   [address] Phone - [phone]   Fax - [phone]

## 2016-11-29 LAB — WET PREP, GENITAL
Trich, Wet Prep: NONE SEEN
Yeast Wet Prep HPF POC: NONE SEEN

## 2016-12-12 ENCOUNTER — Telehealth: Payer: Self-pay | Admitting: General Practice

## 2016-12-12 NOTE — Telephone Encounter (Signed)
Patient called and left message returning our call. Called patient and she states she is having spotting but thinks it is coming from her rectum due to a UC flare up. Told patient to continue to monitor bleeding and call us with any changes. Patient verbalized understanding and had no questions

## 2016-12-12 NOTE — Telephone Encounter (Signed)
Patient called and left message stating she is [redacted] weeks pregnant and is passing blood. Patient states she also has UC and doesn't know if this is a flare up. Called patient, no answer- left message stating we are trying to reach you to return your phone call, please call us back

## 2016-12-27 ENCOUNTER — Ambulatory Visit (INDEPENDENT_AMBULATORY_CARE_PROVIDER_SITE_OTHER): Payer: Medicaid Other | Admitting: Obstetrics and Gynecology

## 2016-12-27 ENCOUNTER — Encounter: Payer: Medicaid Other | Admitting: Obstetrics and Gynecology

## 2016-12-27 VITALS — BP 136/75 | HR 87 | Wt 171.6 lb

## 2016-12-27 DIAGNOSIS — O0990 Supervision of high risk pregnancy, unspecified, unspecified trimester: Secondary | ICD-10-CM

## 2016-12-27 DIAGNOSIS — D573 Sickle-cell trait: Secondary | ICD-10-CM

## 2016-12-27 DIAGNOSIS — D649 Anemia, unspecified: Secondary | ICD-10-CM

## 2016-12-27 DIAGNOSIS — Z8619 Personal history of other infectious and parasitic diseases: Secondary | ICD-10-CM

## 2016-12-27 DIAGNOSIS — K5180 Other ulcerative colitis without complications: Secondary | ICD-10-CM

## 2016-12-27 DIAGNOSIS — O99012 Anemia complicating pregnancy, second trimester: Secondary | ICD-10-CM

## 2016-12-27 NOTE — Progress Notes (Signed)
Prenatal Visit Note Date: 12/27/2016 Clinic: [facility]  Subjective:  Carolyn Blake is a 26 y.o. G1P0000 at 67w4dbeing seen today for ongoing prenatal care.  She is currently monitored for the following issues for this low-risk pregnancy and has Anemia; Supervision of high risk pregnancy, antepartum; Ulcerative colitis ([place]; History of PCR DNA positive for HSV2; Sickle cell trait ([place]; and History of sexually transmitted disease (CT and trich) on her problem list.  Patient reports no complaints.   Contractions: Not present. Vag. Bleeding: None.  Movement: Present. Denies leaking of fluid.   The following portions of the patient's history were reviewed and updated as appropriate: allergies, current medications, past family history, past medical history, past social history, past surgical history and problem list. Problem list updated.  Objective:   Vitals:   12/27/16 1340  BP: 136/75  Pulse: 87  Weight: 171 lb 9.6 oz (77.8 kg)    Fetal Status: Fetal Heart Rate (bpm): 140 Fundal Height: 24 cm Movement: Present     General:  Alert, oriented and cooperative. Patient is in no acute distress.  Skin: Skin is warm and dry. No rash noted.   Cardiovascular: Normal heart rate noted  Respiratory: Normal respiratory effort, no problems with respiration noted  Abdomen: Soft, gravid, appropriate for gestational age. Pain/Pressure: Absent     Pelvic:  Cervical exam deferred        Extremities: Normal range of motion.  Edema: None  Mental Status: Normal mood and affect. Normal behavior. Normal judgment and thought content.   Urinalysis:      Assessment and Plan:  Pregnancy: G1P0000 at 22w4d1. Supervision of high risk pregnancy, antepartum Routine care. 28wk labs nv.  2. Sickle cell trait ([facility] test today - Urine Culture  3. History of sexually transmitted disease (CT and trich) TOC negative this pregnancy  4. Anemia, unspecified type No issues.    5. Other ulcerative colitis without complication (HCC) Has occasional slight blood per rectum but no severe flare s/s and not consistent. Pt setting up to see GI MD again for routine care. ER precautions given.   6. History of PCR DNA positive for HSV2 ppx at 35-36wks  Preterm labor symptoms and general obstetric precautions including but not limited to vaginal bleeding, contractions, leaking of fluid and fetal movement were reviewed in detail with the patient. Please refer to After Visit Summary for other counseling recommendations.  Return in about 3 weeks (around 01/17/2017) for rob and 2hr GTT.   ChAletha HalimMD

## 2016-12-28 LAB — URINE CULTURE: Organism ID, Bacteria: NO GROWTH

## 2016-12-31 ENCOUNTER — Emergency Department (HOSPITAL_COMMUNITY)
Admission: EM | Admit: 2016-12-31 | Discharge: 2016-12-31 | Disposition: A | Discharge status: Home / Self Care | Payer: Medicaid Other | Attending: Emergency Medicine | Admitting: Emergency Medicine

## 2016-12-31 ENCOUNTER — Encounter (HOSPITAL_COMMUNITY): Payer: Self-pay | Admitting: *Deleted

## 2016-12-31 DIAGNOSIS — R51 Headache: Secondary | ICD-10-CM | POA: Insufficient documentation

## 2016-12-31 DIAGNOSIS — R519 Headache, unspecified: Secondary | ICD-10-CM

## 2016-12-31 HISTORY — DX: Headache, unspecified: R51.9

## 2016-12-31 HISTORY — DX: Anemia, unspecified: D64.9

## 2016-12-31 HISTORY — DX: Headache: R51

## 2016-12-31 LAB — CBC WITH DIFFERENTIAL/PLATELET
Basophils Absolute: 0 10*3/uL (ref 0.0–0.1)
Basophils Relative: 0 %
Eosinophils Absolute: 0.9 10*3/uL — ABNORMAL HIGH (ref 0.0–0.7)
Eosinophils Relative: 12 %
HCT: 28.8 % — ABNORMAL LOW (ref 36.0–46.0)
Hemoglobin: 9.8 g/dL — ABNORMAL LOW (ref 12.0–15.0)
Lymphocytes Relative: 17 %
Lymphs Abs: 1.2 10*3/uL (ref 0.7–4.0)
MCH: 28.5 pg (ref 26.0–34.0)
MCHC: 34 g/dL (ref 30.0–36.0)
MCV: 83.7 fL (ref 78.0–100.0)
Monocytes Absolute: 0.7 10*3/uL (ref 0.1–1.0)
Monocytes Relative: 10 %
Neutro Abs: 4.3 10*3/uL (ref 1.7–7.7)
Neutrophils Relative %: 61 %
Platelets: 208 10*3/uL (ref 150–400)
RBC: 3.44 MIL/uL — ABNORMAL LOW (ref 3.87–5.11)
RDW: 13.8 % (ref 11.5–15.5)
WBC: 7 10*3/uL (ref 4.0–10.5)

## 2016-12-31 LAB — BASIC METABOLIC PANEL
Anion gap: 10 (ref 5–15)
BUN: <5 mg/dL — ABNORMAL LOW (ref 6–20)
CO2: 21 mmol/L — ABNORMAL LOW (ref 22–32)
Calcium: 8.1 mg/dL — ABNORMAL LOW (ref 8.9–10.3)
Chloride: 104 mmol/L (ref 101–111)
Creatinine, Ser: 0.54 mg/dL (ref 0.44–1.00)
GFR calc Af Amer: >60 mL/min (ref >60)
GFR calc non Af Amer: >60 mL/min (ref >60)
Glucose, Bld: 92 mg/dL (ref 65–99)
Potassium: 3.4 mmol/L — ABNORMAL LOW (ref 3.5–5.1)
Sodium: 135 mmol/L (ref 135–145)

## 2016-12-31 LAB — URINALYSIS, ROUTINE W REFLEX MICROSCOPIC
Bacteria, UA: NONE SEEN
Bilirubin Urine: NEGATIVE
Glucose, UA: NEGATIVE mg/dL
Hgb urine dipstick: NEGATIVE
Ketones, ur: 5 mg/dL — AB
Nitrite: NEGATIVE
Protein, ur: NEGATIVE mg/dL
Specific Gravity, Urine: 1.01 (ref 1.005–1.030)
pH: 5 (ref 5.0–8.0)

## 2016-12-31 MED ORDER — SODIUM CHLORIDE 0.9 % IV BOLUS (SEPSIS): 500.0000 mL | Freq: Once | INTRAVENOUS | Status: DC

## 2016-12-31 NOTE — ED Notes (Signed)
Pt given fluids. Pt tolerating well. Will continue to monitor.

## 2016-12-31 NOTE — Progress Notes (Signed)
31  Arrived to evaluate this 27 yo G1P0 @ 25.[redacted] wks GA in with complaint of "migraine" headache.  Denies blurry vision, dizziness, abnormal swelling or RUQ abdominal pain.  Denies contractions and bleeding or leaking fluid from vagina. Reports good fetal movement. Pt does not have an ED room at this time. 1815 Pt placed in room, EFM placed.  1830 FHR Category I, no UC's. 1840  Dr. Ilda Basset of [facility] notified of pt in ED and of above.  Notified of VS.  States patient can be OB cleared.

## 2016-12-31 NOTE — Discharge Instructions (Signed)
You have been seen today for a headache. You were also evaluated by the [facility] nurse. No acute abnormalities were found. Follow up with OBGYN on this issue. May use tylenol for pain. Be sure to stay well hydrated. It is easy to get dehydrated during pregnancy and dehydration likely contributes to the headaches.

## 2016-12-31 NOTE — ED Provider Notes (Signed)
[facility] [facility] DEPT Provider Note   CSN: [id number] Arrival date & time: 12/31/16  1630     History   Chief Complaint Chief Complaint  Patient presents with  . Headache    HPI Carolyn Blake is a 27 y.o. female.  HPI   Carolyn Blake is a 27 y.o. female, with a history of Anemia and UC, presenting to the ED with a headache beginning last night. Headache is bilateral frontal and posterior, throbbing, moderate, nonradiating. Accompanied by photophobia, a single episode of vomiting, and a transient feeling of "disorientation." Pt has had similar headaches before. All of her symptoms have since resolved, but she still wants to get checked out. Pt did not take any medications for the headache. Patient denies neuro deficits, dizziness, visual disturbances, or any other complaints.    [facility] nurse spoke with Dr. Ilda Basset, [facility] faculty practice. Pt has been OB cleared. Their suspicion for preeclampsia is low. [facility] and has a close follow-up appointment already scheduled. She is [redacted] weeks pregnant, G1P0. Considered high risk due to her UC.     Past Medical History:  Diagnosis Date  . Anemia   . Headache   . HSV-2 (herpes simplex virus 2) infection   . Irregular heart beat   . Keratoconus   . Ulcerative colitis [facility])     Patient Active Problem List   Diagnosis Date Noted  . History of sexually transmitted disease (CT and trich) 10/31/2016  . Supervision of high risk pregnancy, antepartum 10/03/2016  . Ulcerative colitis ([place]) 10/03/2016  . History of PCR DNA positive for HSV2 10/03/2016  . Sickle cell trait ([place]) 10/03/2016  . Anemia 11/22/2015    Past Surgical History:  Procedure Laterality Date  . NO PAST SURGERIES      OB History    Gravida Para Term Preterm AB Living   1 0 0 0 0 0   SAB TAB Ectopic Multiple Live Births   0 0 0 0 0       Home Medications    Prior to Admission medications   Medication Sig Start Date End  Date Taking? Authorizing Provider  Prenatal Vit-Fe Fumarate-FA (PRENATAL MULTIVITAMIN) TABS tablet Take 1 tablet by mouth daily at 12 noon.    Historical Provider, MD    Family History Family History  Problem Relation Age of Onset  . Heart disease Father   . Diabetes Father     Social History Social History  Substance Use Topics  . Smoking status: Never Smoker  . Smokeless tobacco: Never Used  . Alcohol use No     Allergies   Patient has no known allergies.   Review of Systems Review of Systems  Constitutional: Negative for chills and fever.  Gastrointestinal: Positive for nausea (resolved) and vomiting (resolved).  Musculoskeletal: Negative for back pain and neck pain.  Skin: Negative for rash.  Neurological: Positive for headaches. Negative for dizziness, weakness, light-headedness and numbness.  All other systems reviewed and are negative.    Physical Exam Updated Vital Signs BP 119/75   Pulse 100   Temp 99.4 F (37.4 C) (Oral)   Resp 18   LMP 06/24/2016 (Exact Date)   SpO2 99%   Physical Exam  Constitutional: She appears well-developed and well-nourished. No distress.  HENT:  Head: Normocephalic and atraumatic.  Eyes: Conjunctivae and EOM are normal. Pupils are equal, round, and reactive to light.  Neck: Normal range of motion. Neck supple.  Cardiovascular: Normal rate, regular  rhythm, normal heart sounds and intact distal pulses.   Pulmonary/Chest: Effort normal and breath sounds normal. No respiratory distress.  Abdominal: Soft. There is no tenderness. There is no guarding.  Musculoskeletal: She exhibits no edema.  Normal motor function intact in all extremities and spine. No midline spinal tenderness.   Lymphadenopathy:    She has no cervical adenopathy.  Neurological: She is alert.  No sensory deficits. Strength 5/5 in all extremities. No gait disturbance. Coordination intact. Cranial nerves III-XII grossly intact. No facial droop.   Skin: Skin is  warm and dry. She is not diaphoretic.  Psychiatric: She has a normal mood and affect. Her behavior is normal.  Nursing note and vitals reviewed.    ED Treatments / Results  Labs (all labs ordered are listed, but only abnormal results are displayed) Labs Reviewed  BASIC METABOLIC PANEL - Abnormal; Notable for the following:       Result Value   Potassium 3.4 (*)    CO2 21 (*)    BUN <5 (*)    Calcium 8.1 (*)    All other components within normal limits  CBC WITH DIFFERENTIAL/PLATELET - Abnormal; Notable for the following:    RBC 3.44 (*)    Hemoglobin 9.8 (*)    HCT 28.8 (*)    Eosinophils Absolute 0.9 (*)    All other components within normal limits  URINALYSIS, ROUTINE W REFLEX MICROSCOPIC - Abnormal; Notable for the following:    APPearance HAZY (*)    Ketones, ur 5 (*)    Leukocytes, UA TRACE (*)    Squamous Epithelial / LPF 0-5 (*)    All other components within normal limits   Hemoglobin  Date Value Ref Range Status  12/31/2016 9.8 (L) 12.0 - 15.0 g/dL Final  09/17/2016 10.7 g/dL Final  08/26/2016 10.3 (L) 12.0 - 15.0 g/dL Final  04/19/2016 7.5 (L) 12.0 - 15.0 g/dL Final  11/26/2015 7.5 (L) 12.0 - 15.0 g/dL Final    EKG  EKG Interpretation None       Radiology No results found.  Procedures Procedures (including critical care time)  Medications Ordered in ED Medications - No data to display   Initial Impression / Assessment and Plan / ED Course  I have reviewed the triage vital signs and the nursing notes.  Pertinent labs & imaging results that were available during my care of the patient were reviewed by me and considered in my medical decision making (see chart for details).  Clinical Course     Patient presents with complaint of a headache that has since resolved. She has no red flag symptoms. No neuro or functional deficits. Cleared by OB. Patient has close follow-up artery scheduled. The patient was given instructions for home care as well as  strict return precautions. Patient voices understanding of these instructions, accepts the plan, and is comfortable with discharge.  Vitals:   12/31/16 1828 12/31/16 1900 12/31/16 2000 12/31/16 2030  BP:  106/76 115/79 111/63  Pulse: 100 96 100 101  Resp:  18 18   Temp:      TempSrc:      SpO2: 99% 100% 100% 100%     Final Clinical Impressions(s) / ED Diagnoses   Final diagnoses:  Nonintractable episodic headache, unspecified headache type    New Prescriptions Discharge Medication List as of 12/31/2016  8:41 PM       Lorayne Bender, PA-C 01/01/17 1647    Davonna Belling, MD 01/02/17 0121

## 2016-12-31 NOTE — ED Triage Notes (Addendum)
Pt reports headache that started last night with nausea and light sensitivity. Pt is [redacted] weeks pregnant as well. Denies any complications with pregnancy other than anemia.

## 2016-12-31 NOTE — ED Triage Notes (Signed)
Rapid response ob paged in regards to pt

## 2017-01-17 ENCOUNTER — Encounter: Payer: Medicaid Other | Admitting: Family

## 2017-01-24 ENCOUNTER — Ambulatory Visit (INDEPENDENT_AMBULATORY_CARE_PROVIDER_SITE_OTHER): Payer: Medicaid Other | Admitting: Student

## 2017-01-24 VITALS — BP 134/69 | HR 74 | Wt 176.0 lb

## 2017-01-24 DIAGNOSIS — O0990 Supervision of high risk pregnancy, unspecified, unspecified trimester: Secondary | ICD-10-CM

## 2017-01-24 DIAGNOSIS — O99013 Anemia complicating pregnancy, third trimester: Secondary | ICD-10-CM

## 2017-01-24 DIAGNOSIS — O98313 Other infections with a predominantly sexual mode of transmission complicating pregnancy, third trimester: Secondary | ICD-10-CM

## 2017-01-24 DIAGNOSIS — Z8619 Personal history of other infectious and parasitic diseases: Secondary | ICD-10-CM

## 2017-01-24 DIAGNOSIS — D573 Sickle-cell trait: Secondary | ICD-10-CM

## 2017-01-24 DIAGNOSIS — K5100 Ulcerative (chronic) pancolitis without complications: Secondary | ICD-10-CM

## 2017-01-24 DIAGNOSIS — D649 Anemia, unspecified: Secondary | ICD-10-CM

## 2017-01-24 DIAGNOSIS — A6009 Herpesviral infection of other urogenital tract: Secondary | ICD-10-CM

## 2017-01-24 DIAGNOSIS — B009 Herpesviral infection, unspecified: Secondary | ICD-10-CM

## 2017-01-24 LAB — CBC
HCT: 30.1 % — ABNORMAL LOW (ref 35.0–45.0)
Hemoglobin: 9.9 g/dL — ABNORMAL LOW (ref 11.7–15.5)
MCH: 29.6 pg (ref 27.0–33.0)
MCHC: 32.9 g/dL (ref 32.0–36.0)
MCV: 90.1 fL (ref 80.0–100.0)
MPV: 10.2 fL (ref 7.5–12.5)
Platelets: 208 10*3/uL (ref 140–400)
RBC: 3.34 MIL/uL — ABNORMAL LOW (ref 3.80–5.10)
RDW: 14.5 % (ref 11.0–15.0)
WBC: 8.8 10*3/uL (ref 3.8–10.8)

## 2017-01-24 NOTE — Patient Instructions (Signed)
Pregnancy and Anemia Anemia is a condition in which the concentration of red blood cells or hemoglobin in the blood is below normal. Hemoglobin is a substance in red blood cells that carries oxygen to the tissues of the body. Anemia results in not enough oxygen reaching these tissues. Anemia during pregnancy is common because the fetus uses more iron and folic acid as it is developing. Your body may not produce enough red blood cells because of this. Also, during pregnancy, the liquid part of the blood (plasma) increases by about 50%, and the red blood cells increase by only 25%. This lowers the concentration of the red blood cells and creates a natural anemia-like situation. What are the causes? The most common cause of anemia during pregnancy is not having enough iron in the body to make red blood cells (iron deficiency anemia). Other causes may include:  Folic acid deficiency.  Vitamin B12 deficiency.  Certain prescription or over-the-counter medicines.  Certain medical conditions or infections that destroy red blood cells.  A low platelet count and bleeding caused by antibodies that go through the placenta to the fetus from the mother's blood. What are the signs or symptoms? Mild anemia may not be noticeable. If it becomes severe, symptoms may include:  Tiredness.  Shortness of breath, especially with exercise.  Weakness.  Fainting.  Pale looking skin.  Headaches.  Feeling a fast or irregular heartbeat (palpitations). How is this diagnosed? The type of anemia is usually diagnosed from your family and medical history and blood tests. How is this treated? Treatment of anemia during pregnancy depends on the cause of the anemia. Treatment can include:  Supplements of iron, vitamin I43, or folic acid.  A blood transfusion. This may be needed if blood loss is severe.  Hospitalization. This may be needed if there is significant continual blood loss.  Dietary changes. Follow  these instructions at home:  Follow your dietitian's or health care provider's dietary recommendations.  Increase your vitamin C intake. This will help the stomach absorb more iron.  Eat a diet rich in iron. This would include foods such as:  Liver.  Beef.  Whole grain bread.  Eggs.  Dried fruit.  Take iron and vitamins as directed by your health care provider.  Eat green leafy vegetables. These are a good source of folic acid. Contact a health care provider if:  You have frequent or lasting headaches.  You are looking pale.  You are bruising easily. Get help right away if:  You have extreme weakness, shortness of breath, or chest pain.  You become dizzy or have trouble concentrating.  You have heavy vaginal bleeding.  You develop a rash.  You have bloody or black, tarry stools.  You faint.  You vomit up blood.  You vomit repeatedly.  You have abdominal pain.  You have a fever or persistent symptoms for more than 2-3 days.  You have a fever and your symptoms suddenly get worse.  You are dehydrated. This information is not intended to replace advice given to you by your health care provider. Make sure you discuss any questions you have with your health care provider. Document Released: 12/14/2000 Document Revised: 05/24/2016 Document Reviewed: 07/29/2013 Elsevier Interactive Patient Education  2017 [employer].

## 2017-01-24 NOTE — Progress Notes (Addendum)
   PRENATAL VISIT NOTE  Subjective:  Carolyn Blake is a 27 y.o. G1P0000 at 52w4dbeing seen today for ongoing prenatal care.  She is currently monitored for the following issues for this high-risk pregnancy and has Anemia; Supervision of high risk pregnancy, antepartum; Ulcerative colitis ([place]; History of PCR DNA positive for HSV2; Sickle cell trait ([place]; and History of sexually transmitted disease (CT and trich) on her problem list.  Patient reports no complaints.  Contractions: Not present. Vag. Bleeding: None.  Movement: Present. Denies leaking of fluid.   The following portions of the patient's history were reviewed and updated as appropriate: allergies, current medications, past family history, past medical history, past social history, past surgical history and problem list. Problem list updated.  Objective:   Vitals:   01/24/17 1421  BP: 134/69  Pulse: 74  Weight: 176 lb (79.8 kg)    Fetal Status: Fetal Heart Rate (bpm): 137 Fundal Height: 30 cm Movement: Present     General:  Alert, oriented and cooperative. Patient is in no acute distress.  Skin: Skin is warm and dry. No rash noted.   Cardiovascular: Normal heart rate noted  Respiratory: Normal respiratory effort, no problems with respiration noted  Abdomen: Soft, gravid, appropriate for gestational age. Pain/Pressure: Absent     Pelvic:  Cervical exam deferred        Extremities: Normal range of motion.  Edema: Trace  Mental Status: Normal mood and affect. Normal behavior. Normal judgment and thought content.   Assessment and Plan:  Pregnancy: G1P0000 at 27w4d1. Supervision of high risk pregnancy, antepartum Patient is doing well, no complaints. In the process of completing her 2 hour GTT - 2Hr GTT w/ 1 Hr Carpenter 75 g - CBC - RPR - HIV antibody - Hepatitis B Surface AntiGEN  2. Sickle cell trait (HCC)  - 2Hr GTT w/ 1 Hr Carpenter 75 g - CBC - RPR - HIV antibody  3. Ulcerative pancolitis without  complication ([place]  4. HSV-2 (herpes simplex virus 2) infection No outbreaks, patient plans to begin suppression at 36 weeks  5. History of PCR DNA positive for HSV2   6. Anemia, unspecified type -Patient says that her UC is causing her anemia and that prenatal vitamins and [place] vitamins made her feel sick. Recommended Floradix liquid iron supplements to be taken with OJ 3 times daily.  -CBC today; based on results may need further anemia workup Preterm labor symptoms and general obstetric precautions including but not limited to vaginal bleeding, contractions, leaking of fluid and fetal movement were reviewed in detail with the patient. Please refer to After Visit Summary for other counseling recommendations.  Return in about 2 weeks (around 02/07/2017) for ROB.   KaStarr LakeCNM

## 2017-01-25 LAB — HIV ANTIBODY (ROUTINE TESTING W REFLEX): HIV 1&2 Ab, 4th Generation: NONREACTIVE

## 2017-01-25 LAB — HEPATITIS B SURFACE ANTIGEN: Hepatitis B Surface Ag: NEGATIVE

## 2017-01-25 LAB — RPR

## 2017-01-25 LAB — 2HR GTT W 1 HR, CARPENTER, 75 G
Glucose, 1 Hr, Gest: 77 mg/dL (ref <180)
Glucose, 2 Hr, Gest: 97 mg/dL (ref <153)
Glucose, Fasting, Gest: 54 mg/dL — ABNORMAL LOW (ref 65–91)

## 2017-01-28 ENCOUNTER — Encounter: Payer: Self-pay | Admitting: Student

## 2017-02-07 ENCOUNTER — Encounter: Payer: Medicaid Other | Admitting: Obstetrics and Gynecology

## 2017-02-13 ENCOUNTER — Ambulatory Visit (INDEPENDENT_AMBULATORY_CARE_PROVIDER_SITE_OTHER): Payer: Medicaid Other | Admitting: Advanced Practice Midwife

## 2017-02-13 DIAGNOSIS — O0990 Supervision of high risk pregnancy, unspecified, unspecified trimester: Secondary | ICD-10-CM

## 2017-02-13 DIAGNOSIS — O0993 Supervision of high risk pregnancy, unspecified, third trimester: Secondary | ICD-10-CM

## 2017-02-13 NOTE — Progress Notes (Signed)
   PRENATAL VISIT NOTE  Subjective:  Carolyn Blake is a 27 y.o. G1P0000 at 87w3dbeing seen today for ongoing prenatal care.  She is currently monitored for the following issues for this high-risk pregnancy and has Anemia; Supervision of high risk pregnancy, antepartum; Ulcerative colitis ([place]; History of PCR DNA positive for HSV2; Sickle cell trait ([place]; and History of sexually transmitted disease (CT and trich) on her problem list.  Patient reports no complaints.  Contractions: Not present. Vag. Bleeding: None.  Movement: Present. Denies leaking of fluid.   The following portions of the patient's history were reviewed and updated as appropriate: allergies, current medications, past family history, past medical history, past social history, past surgical history and problem list. Problem list updated.  Objective:   Vitals:   02/13/17 0750  BP: 126/67  Pulse: 89  Weight: 188 lb (85.3 kg)    Fetal Status: Fetal Heart Rate (bpm): 158 Fundal Height: 33 cm Movement: Present  Presentation: Vertex  General:  Alert, oriented and cooperative. Patient is in no acute distress.  Skin: Skin is warm and dry. No rash noted.   Cardiovascular: Normal heart rate noted  Respiratory: Normal respiratory effort, no problems with respiration noted  Abdomen: Soft, gravid, appropriate for gestational age. Pain/Pressure: Absent     Pelvic:  Cervical exam deferred        Extremities: Normal range of motion.  Edema: Trace  Mental Status: Normal mood and affect. Normal behavior. Normal judgment and thought content.   Assessment and Plan:  Pregnancy: G1P0000 at 32w3d1. Supervision of high risk pregnancy, antepartum   Preterm labor symptoms and general obstetric precautions including but not limited to vaginal bleeding, contractions, leaking of fluid and fetal movement were reviewed in detail with the patient. Please refer to After Visit Summary for other counseling recommendations.  Return in about 2  weeks (around 02/27/2017) for [place]  ViManya SilvasCNM

## 2017-02-13 NOTE — Patient Instructions (Signed)
Braxton Hicks Contractions Contractions of the uterus can occur throughout pregnancy. Contractions are not always a sign that you are in labor.  WHAT ARE BRAXTON HICKS CONTRACTIONS?  Contractions that occur before labor are called Braxton Hicks contractions, or false labor. Toward the end of pregnancy (32-34 weeks), these contractions can develop more often and may become more forceful. This is not true labor because these contractions do not result in opening (dilatation) and thinning of the cervix. They are sometimes difficult to tell apart from true labor because these contractions can be forceful and people have different pain tolerances. You should not feel embarrassed if you go to the hospital with false labor. Sometimes, the only way to tell if you are in true labor is for your health care provider to look for changes in the cervix. If there are no prenatal problems or other health problems associated with the pregnancy, it is completely safe to be sent home with false labor and await the onset of true labor. HOW CAN YOU TELL THE DIFFERENCE BETWEEN TRUE AND FALSE LABOR? False Labor   The contractions of false labor are usually shorter and not as hard as those of true labor.   The contractions are usually irregular.   The contractions are often felt in the front of the lower abdomen and in the groin.   The contractions may go away when you walk around or change positions while lying down.   The contractions get weaker and are shorter lasting as time goes on.   The contractions do not usually become progressively stronger, regular, and closer together as with true labor.  True Labor   Contractions in true labor last 30-70 seconds, become very regular, usually become more intense, and increase in frequency.   The contractions do not go away with walking.   The discomfort is usually felt in the top of the uterus and spreads to the lower abdomen and low back.   True labor can be  determined by your health care provider with an exam. This will show that the cervix is dilating and getting thinner.  WHAT TO REMEMBER  Keep up with your usual exercises and follow other instructions given by your health care provider.   Take medicines as directed by your health care provider.   Keep your regular prenatal appointments.   Eat and drink lightly if you think you are going into labor.   If Braxton Hicks contractions are making you uncomfortable:   Change your position from lying down or resting to walking, or from walking to resting.   Sit and rest in a tub of warm water.   Drink 2-3 glasses of water. Dehydration may cause these contractions.   Do slow and deep breathing several times an hour.  WHEN SHOULD I SEEK IMMEDIATE MEDICAL CARE? Seek immediate medical care if:  Your contractions become stronger, more regular, and closer together.   You have fluid leaking or gushing from your vagina.   You have a fever.   You pass blood-tinged mucus.   You have vaginal bleeding.   You have continuous abdominal pain.   You have low back pain that you never had before.   You feel your baby's head pushing down and causing pelvic pressure.   Your baby is not moving as much as it used to.  This information is not intended to replace advice given to you by your health care provider. Make sure you discuss any questions you have with your health care  provider. Document Released: 12/17/2005 Document Revised: 04/09/2016 Document Reviewed: 09/28/2013 Elsevier Interactive Patient Education  2017 [employer].

## 2017-02-28 ENCOUNTER — Ambulatory Visit (INDEPENDENT_AMBULATORY_CARE_PROVIDER_SITE_OTHER): Payer: Medicaid Other | Admitting: Advanced Practice Midwife

## 2017-02-28 VITALS — BP 102/77 | HR 88 | Wt 186.3 lb

## 2017-02-28 DIAGNOSIS — D500 Iron deficiency anemia secondary to blood loss (chronic): Secondary | ICD-10-CM

## 2017-02-28 DIAGNOSIS — O99013 Anemia complicating pregnancy, third trimester: Secondary | ICD-10-CM

## 2017-02-28 DIAGNOSIS — O0990 Supervision of high risk pregnancy, unspecified, unspecified trimester: Secondary | ICD-10-CM

## 2017-02-28 DIAGNOSIS — Z8619 Personal history of other infectious and parasitic diseases: Secondary | ICD-10-CM

## 2017-02-28 MED ORDER — VALACYCLOVIR HCL 500 MG PO TABS: 500.0000 mg | ORAL_TABLET | Freq: Two times a day (BID) | ORAL | 6 refills | Status: DC

## 2017-02-28 NOTE — Progress Notes (Signed)
   PRENATAL VISIT NOTE  Subjective:  Carolyn Blake is a 27 y.o. G1P0000 at 45w4dbeing seen today for ongoing prenatal care.  She is currently monitored for the following issues for this low-risk pregnancy and has Anemia; Supervision of high risk pregnancy, antepartum; Ulcerative colitis ([place]; History of PCR DNA positive for HSV2; Sickle cell trait (HW[place]; and History of sexually transmitted disease (CT and trich) on her problem list.  Patient reports no complaints.  Contractions: Irritability. Vag. Bleeding: None.  Movement: Present. Denies leaking of fluid.   The following portions of the patient's history were reviewed and updated as appropriate: allergies, current medications, past family history, past medical history, past social history, past surgical history and problem list. Problem list updated.  Objective:   Vitals:   02/28/17 0745  BP: 102/77  Pulse: 88  Weight: 186 lb 4.8 oz (84.5 kg)    Fetal Status: Fetal Heart Rate (bpm): 140   Movement: Present     General:  Alert, oriented and cooperative. Patient is in no acute distress.  Skin: Skin is warm and dry. No rash noted.   Cardiovascular: Normal heart rate noted  Respiratory: Normal respiratory effort, no problems with respiration noted  Abdomen: Soft, gravid, appropriate for gestational age. Pain/Pressure: Absent     Pelvic:  Cervical exam deferred        Extremities: Normal range of motion.  Edema: None  Mental Status: Normal mood and affect. Normal behavior. Normal judgment and thought content.   Assessment and Plan:  Pregnancy: G1P0000 at 332w4d1. Iron deficiency anemia due to chronic blood loss  -Monitor and notify for any bleeding  2. Supervision of high risk pregnancy, antepartum - F/u in 2 weeks -  Discussed s/sx of labor, when to come to hospital, Braxton-Hicks contractions, preparing for baby  3. History of PCR DNA positive for HSV2 - valACYclovir (VALTREX) 500 MG tablet; Take 1 tablet (500 mg total)  by mouth 2 (two) times daily.  Dispense: 30 tablet; Refill: 6  Term labor symptoms and general obstetric precautions including but not limited to vaginal bleeding, contractions, leaking of fluid and fetal movement were reviewed in detail with the patient. Please refer to After Visit Summary for other counseling recommendations.  Return in about 2 weeks (around 03/14/2017) [place]  RaAnselm PancoastRN

## 2017-02-28 NOTE — Patient Instructions (Signed)

## 2017-03-01 ENCOUNTER — Telehealth: Payer: Self-pay | Admitting: *Deleted

## 2017-03-01 NOTE — Telephone Encounter (Signed)
Patient left message on nurse line, stating she was in office today and was fine, but at home she started feeling lightheadedness and is having tingling and numbness in her lips and hands. Wants to know if she should come to the ER.

## 2017-03-01 NOTE — Telephone Encounter (Signed)
Returned patient call. There was no answer. Message left stating I am calling to check on how she is doing, asked her to return my call. Per Dr Rip Harbour pt needs to increase her water intake and start Benadryl 18m every 6 hrs for 24 hrs. If this does not help then she needs to come to the [facility] for evaluation.

## 2017-03-14 ENCOUNTER — Other Ambulatory Visit (HOSPITAL_COMMUNITY)
Admission: RE | Admit: 2017-03-14 | Discharge: 2017-03-14 | Disposition: A | Discharge status: Home / Self Care | Payer: Medicaid Other | Source: Ambulatory Visit | Attending: Medical | Admitting: Medical

## 2017-03-14 ENCOUNTER — Ambulatory Visit (INDEPENDENT_AMBULATORY_CARE_PROVIDER_SITE_OTHER): Payer: Medicaid Other | Admitting: Medical

## 2017-03-14 VITALS — BP 134/70 | HR 77 | Wt 194.7 lb

## 2017-03-14 DIAGNOSIS — D573 Sickle-cell trait: Secondary | ICD-10-CM

## 2017-03-14 DIAGNOSIS — Z113 Encounter for screening for infections with a predominantly sexual mode of transmission: Secondary | ICD-10-CM | POA: Diagnosis not present

## 2017-03-14 DIAGNOSIS — O0990 Supervision of high risk pregnancy, unspecified, unspecified trimester: Secondary | ICD-10-CM

## 2017-03-14 DIAGNOSIS — O98813 Other maternal infectious and parasitic diseases complicating pregnancy, third trimester: Secondary | ICD-10-CM

## 2017-03-14 DIAGNOSIS — A6009 Herpesviral infection of other urogenital tract: Secondary | ICD-10-CM

## 2017-03-14 DIAGNOSIS — B009 Herpesviral infection, unspecified: Secondary | ICD-10-CM

## 2017-03-14 DIAGNOSIS — O99013 Anemia complicating pregnancy, third trimester: Secondary | ICD-10-CM

## 2017-03-14 LAB — OB RESULTS CONSOLE GBS: GBS: POSITIVE

## 2017-03-14 NOTE — Patient Instructions (Signed)
Fetal Movement Counts Patient Name: ________________________________________________ Patient Due Date: ____________________ What is a fetal movement count? A fetal movement count is the number of times that you feel your baby move during a certain amount of time. This may also be called a fetal kick count. A fetal movement count is recommended for every pregnant woman. You may be asked to start counting fetal movements as early as week 28 of your pregnancy. Pay attention to when your baby is most active. You may notice your baby's sleep and wake cycles. You may also notice things that make your baby move more. You should do a fetal movement count:  When your baby is normally most active.  At the same time each day. A good time to count movements is while you are resting, after having something to eat and drink. How do I count fetal movements? 1. Find a quiet, comfortable area. Sit, or lie down on your side. 2. Write down the date, the start time and stop time, and the number of movements that you felt between those two times. Take this information with you to your health care visits. 3. For 2 hours, count kicks, flutters, swishes, rolls, and jabs. You should feel at least 10 movements during 2 hours. 4. You may stop counting after you have felt 10 movements. 5. If you do not feel 10 movements in 2 hours, have something to eat and drink. Then, keep resting and counting for 1 hour. If you feel at least 4 movements during that hour, you may stop counting. Contact a health care provider if:  You feel fewer than 4 movements in 2 hours.  Your baby is not moving like he or she usually does. Date: ____________ Start time: ____________ Stop time: ____________ Movements: ____________ Date: ____________ Start time: ____________ Stop time: ____________ Movements: ____________ Date: ____________ Start time: ____________ Stop time: ____________ Movements: ____________ Date: ____________ Start time:  ____________ Stop time: ____________ Movements: ____________ Date: ____________ Start time: ____________ Stop time: ____________ Movements: ____________ Date: ____________ Start time: ____________ Stop time: ____________ Movements: ____________ Date: ____________ Start time: ____________ Stop time: ____________ Movements: ____________ Date: ____________ Start time: ____________ Stop time: ____________ Movements: ____________ Date: ____________ Start time: ____________ Stop time: ____________ Movements: ____________ This information is not intended to replace advice given to you by your health care provider. Make sure you discuss any questions you have with your health care provider. Document Released: 01/16/2007 Document Revised: 08/15/2016 Document Reviewed: 01/26/2016 Elsevier Interactive Patient Education  2017 [employer] of the uterus can occur throughout pregnancy, but they are not always a sign that you are in labor. You may have practice contractions called Braxton Hicks contractions. These false labor contractions are sometimes confused with true labor. What are [place] contractions? Braxton Hicks contractions are tightening movements that occur in the muscles of the uterus before labor. Unlike true labor contractions, these contractions do not result in opening (dilation) and thinning of the cervix. Toward the end of pregnancy (32-34 weeks), Braxton Hicks contractions can happen more often and may become stronger. These contractions are sometimes difficult to tell apart from true labor because they can be very uncomfortable. You should not feel embarrassed if you go to the hospital with false labor. Sometimes, the only way to tell if you are in true labor is for your health care provider to look for changes in the cervix. The health care provider will do a physical exam and may monitor your contractions. If you  are not in true labor, the exam  should show that your cervix is not dilating and your water has not broken. If there are no prenatal problems or other health problems associated with your pregnancy, it is completely safe for you to be sent home with false labor. You may continue to have Braxton Hicks contractions until you go into true labor. How can I tell the difference between true labor and false labor?  Differences  False labor  Contractions last 30-70 seconds.: Contractions are usually shorter and not as strong as true labor contractions.  Contractions become very regular.: Contractions are usually irregular.  Discomfort is usually felt in the top of the uterus, and it spreads to the lower abdomen and low back.: Contractions are often felt in the front of the lower abdomen and in the groin.  Contractions do not go away with walking.: Contractions may go away when you walk around or change positions while lying down.  Contractions usually become more intense and increase in frequency.: Contractions get weaker and are shorter-lasting as time goes on.  The cervix dilates and gets thinner.: The cervix usually does not dilate or become thin. Follow these instructions at home:  Take over-the-counter and prescription medicines only as told by your health care provider.  Keep up with your usual exercises and follow other instructions from your health care provider.  Eat and drink lightly if you think you are going into labor.  If Braxton Hicks contractions are making you uncomfortable:  Change your position from lying down or resting to walking, or change from walking to resting.  Sit and rest in a tub of warm water.  Drink enough fluid to keep your urine clear or pale yellow. Dehydration may cause these contractions.  Do slow and deep breathing several times an hour.  Keep all follow-up prenatal visits as told by your health care provider. This is important. Contact a health care provider if:  You have a  fever.  You have continuous pain in your abdomen. Get help right away if:  Your contractions become stronger, more regular, and closer together.  You have fluid leaking or gushing from your vagina.  You pass blood-tinged mucus (bloody show).  You have bleeding from your vagina.  You have low back pain that you never had before.  You feel your baby's head pushing down and causing pelvic pressure.  Your baby is not moving inside you as much as it used to. Summary  Contractions that occur before labor are called Braxton Hicks contractions, false labor, or practice contractions.  Braxton Hicks contractions are usually shorter, weaker, farther apart, and less regular than true labor contractions. True labor contractions usually become progressively stronger and regular and they become more frequent.  Manage discomfort from [facility] contractions by changing position, resting in a warm bath, drinking plenty of water, or practicing deep breathing. This information is not intended to replace advice given to you by your health care provider. Make sure you discuss any questions you have with your health care provider. Document Released: 12/17/2005 Document Revised: 11/05/2016 Document Reviewed: 11/05/2016 Elsevier Interactive Patient Education  2017 [employer].

## 2017-03-14 NOTE — Telephone Encounter (Signed)
Patient was seen in our office 03/14/2017 today for office visit.

## 2017-03-14 NOTE — Progress Notes (Signed)
   PRENATAL VISIT NOTE  Subjective:  Carolyn Blake is a 26 y.o. G1P0000 at 6w4dbeing seen today for ongoing prenatal care.  She is currently monitored for the following issues for this high-risk pregnancy and has Anemia; Supervision of high risk pregnancy, antepartum; Ulcerative colitis ([place]; History of PCR DNA positive for HSV2; Sickle cell trait ([place]; and History of sexually transmitted disease (CT and trich) on her problem list.  Patient reports no complaints.  Contractions: Not present. Vag. Bleeding: None.  Movement: Present. Denies leaking of fluid.   The following portions of the patient's history were reviewed and updated as appropriate: allergies, current medications, past family history, past medical history, past social history, past surgical history and problem list. Problem list updated.  Objective:   Vitals:   03/14/17 0756  BP: 134/70  Pulse: 77  Weight: 194 lb 11.2 oz (88.3 kg)    Fetal Status: Fetal Heart Rate (bpm): 150 Fundal Height: 36 cm Movement: Present  Presentation: Vertex  General:  Alert, oriented and cooperative. Patient is in no acute distress.  Skin: Skin is warm and dry. No rash noted.   Cardiovascular: Normal heart rate noted  Respiratory: Normal respiratory effort, no problems with respiration noted  Abdomen: Soft, gravid, appropriate for gestational age. Pain/Pressure: Absent     Pelvic:  Cervical exam performed Dilation: Fingertip Effacement (%): 20 Station: -2  Extremities: Normal range of motion.  Edema: Trace  Mental Status: Normal mood and affect. Normal behavior. Normal judgment and thought content.   Assessment and Plan:  Pregnancy: G1P0000 at 34w4d1. Supervision of high risk pregnancy, antepartum - Cervicovaginal ancillary only - Strep Gp B NAA  2. HSV-2 (herpes simplex virus 2) infection - Patient has Rx for Valtrex and plans to start suppressive therapy at 36 weeks - Patient's father has concerns about patient having a  vaginal delivery due to possible HSV, advised of plan for suppressive therapy and plan for C/S if there is an outbreak when patient is in labor. Still would like to plan C/S, will have patient scheduled with MD at next visit to discuss these concerns with the patient and her father.   Preterm labor symptoms and general obstetric precautions including but not limited to vaginal bleeding, contractions, leaking of fluid and fetal movement were reviewed in detail with the patient. Please refer to After Visit Summary for other counseling recommendations.  Return in about 1 week (around 03/21/2017) for LOB with MD/Surgeon.   JuLuvenia ReddenPA-C

## 2017-03-14 NOTE — Progress Notes (Signed)
GBS and gc/cl cultures today

## 2017-03-16 LAB — CERVICOVAGINAL ANCILLARY ONLY
Chlamydia: NEGATIVE
Neisseria Gonorrhea: NEGATIVE

## 2017-03-16 LAB — STREP GP B NAA: Strep Gp B NAA: POSITIVE — AB

## 2017-03-19 ENCOUNTER — Telehealth: Payer: Self-pay | Admitting: *Deleted

## 2017-03-19 NOTE — Telephone Encounter (Addendum)
Pt left message stating that she started having back and pelvic pain 2 hrs after waking today. She is unsure if this is labor. Please call back.   3/21  1325  Called pt and discussed her concerns. She stated that she has Braxton Hicks contractions while @ work and on her feet. She has also been having some nausea. I advised these sx are normal. Pt advised to come to MAU of UC's become stronger and regular. Pt has Ob visit tomorrow and if desired can discuss Rx for nausea with Dr. Rip Harbour.  Pt voiced understanding.

## 2017-03-21 ENCOUNTER — Ambulatory Visit (INDEPENDENT_AMBULATORY_CARE_PROVIDER_SITE_OTHER): Payer: Medicaid Other | Admitting: Obstetrics and Gynecology

## 2017-03-21 ENCOUNTER — Encounter: Payer: Self-pay | Admitting: Obstetrics and Gynecology

## 2017-03-21 VITALS — BP 128/71 | HR 75 | Wt 194.0 lb

## 2017-03-21 DIAGNOSIS — O0990 Supervision of high risk pregnancy, unspecified, unspecified trimester: Secondary | ICD-10-CM

## 2017-03-21 DIAGNOSIS — O0993 Supervision of high risk pregnancy, unspecified, third trimester: Secondary | ICD-10-CM

## 2017-03-21 DIAGNOSIS — Z8619 Personal history of other infectious and parasitic diseases: Secondary | ICD-10-CM

## 2017-03-21 NOTE — Patient Instructions (Signed)

## 2017-03-21 NOTE — Progress Notes (Signed)
Subjective:  Carolyn Blake is a 27 y.o. G1P0000 at 63w4dbeing seen today for ongoing prenatal care.  She is currently monitored for the following issues for this low-risk pregnancy and has Anemia; Supervision of high risk pregnancy, antepartum; Ulcerative colitis ([name]; History of PCR DNA positive for HSV2; Sickle cell trait ([name]; and History of sexually transmitted disease (CT and trich) on her problem list.  Patient reports some lower ext edema when she works. 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  Contractions: Irregular. Vag. Bleeding: None.  Movement: Present. Denies leaking of fluid.   The following portions of the patient's history were reviewed and updated as appropriate: allergies, current medications, past family history, past medical history, past social history, past surgical history and problem list. Problem list updated.  Objective:   Vitals:   03/21/17 0757  BP: 128/71  Pulse: 75  Weight: 194 lb (88 kg)    Fetal Status: Fetal Heart Rate (bpm): 155   Movement: Present     General:  Alert, oriented and cooperative. Patient is in no acute distress.  Skin: Skin is warm and dry. No rash noted.   Cardiovascular: Normal heart rate noted  Respiratory: Normal respiratory effort, no problems with respiration noted  Abdomen: Soft, gravid, appropriate for gestational age. Pain/Pressure: Present     Pelvic:  Cervical exam deferred        Extremities: Normal range of motion.  Edema: Trace  Mental Status: Normal mood and affect. Normal behavior. Normal judgment and thought content.   Urinalysis:      Assessment and Plan:  Pregnancy: G1P0000 at 380w4d1. Supervision of high risk pregnancy, antepartum Labor precautions Pt informed unable to place pt out of work without a medical indications. Note for work provided, restricting to 5 eight hour shifts with 15 minute breaks every 4 hours.  2. History of PCR DNA positive for HSV2 Pt to continue with  suppression. Discussed that we would only proceed with c section if current outbreak or recent outbreak at time of labor. We otherwise would recommend a vaginal delivery. Pt verbalized understand. Of note pt's last outbreak was 01/2015.  Term labor symptoms and general obstetric precautions including but not limited to vaginal bleeding, contractions, leaking of fluid and fetal movement were reviewed in detail with the patient. Please refer to After Visit Summary for other counseling recommendations.  Return in about 1 week (around 03/28/2017) for OB visit.   MiChancy MilroyMD

## 2017-04-04 ENCOUNTER — Ambulatory Visit (INDEPENDENT_AMBULATORY_CARE_PROVIDER_SITE_OTHER): Payer: Medicaid Other | Admitting: Obstetrics & Gynecology

## 2017-04-04 VITALS — BP 108/80 | HR 82 | Wt 193.1 lb

## 2017-04-04 DIAGNOSIS — O0990 Supervision of high risk pregnancy, unspecified, unspecified trimester: Secondary | ICD-10-CM

## 2017-04-04 DIAGNOSIS — Z8619 Personal history of other infectious and parasitic diseases: Secondary | ICD-10-CM

## 2017-04-04 DIAGNOSIS — O99820 Streptococcus B carrier state complicating pregnancy: Secondary | ICD-10-CM

## 2017-04-04 NOTE — Progress Notes (Signed)
   PRENATAL VISIT NOTE  Subjective:  Carolyn Blake is a 27 y.o. G1P0000 at 42w4dbeing seen today for ongoing prenatal care.  She is currently monitored for the following issues for this high-risk pregnancy and has Anemia; Supervision of high risk pregnancy, antepartum; Ulcerative colitis ([place]aud[place]; History of PCR DNA positive for HSV2; Sickle cell trait (HBigl[place]; History of sexually transmitted disease (CT and trich); and Group B Streptococcus carrier, +RV culture, currently pregnant on her problem list.  Patient reports no complaints.  Contractions: Irregular. Vag. Bleeding: None.  Movement: Present. Denies leaking of fluid.   The following portions of the patient's history were reviewed and updated as appropriate: allergies, current medications, past family history, past medical history, past social history, past surgical history and problem list. Problem list updated.  Objective:   Vitals:   04/04/17 0806  BP: 108/80  Pulse: 82  Weight: 193 lb 1.6 oz (87.6 kg)    Fetal Status: Fetal Heart Rate (bpm): 145 Fundal Height: 38 cm Movement: Present     General:  Alert, oriented and cooperative. Patient is in no acute distress.  Skin: Skin is warm and dry. No rash noted.   Cardiovascular: Normal heart rate noted  Respiratory: Normal respiratory effort, no problems with respiration noted  Abdomen: Soft, gravid, appropriate for gestational age. Pain/Pressure: Present     Pelvic:  Cervical exam deferred        Extremities: Normal range of motion.  Edema: Trace  Mental Status: Normal mood and affect. Normal behavior. Normal judgment and thought content.   Assessment and Plan:  Pregnancy: G1P0000 at 333w4d1. Group B Streptococcus carrier, +RV culture, currently pregnant Will treat in labor  2. History of PCR DNA positive for HSV2 Continue suppression  3. Supervision of high risk pregnancy, antepartum Term labor symptoms and general obstetric precautions including but not limited to  vaginal bleeding, contractions, leaking of fluid and fetal movement were reviewed in detail with the patient. Please refer to After Visit Summary for other counseling recommendations.  Return in about 1 week (around 04/11/2017) for OB Visit.   UgOsborne OmanMD

## 2017-04-04 NOTE — Patient Instructions (Addendum)
Return to clinic for any scheduled appointments or obstetric concerns, or go to MAU for evaluation   Group B Streptococcus Infection During Pregnancy Group B Streptococcus (GBS) is a type of bacteria (Streptococcus agalactiae) that is often found in healthy people, commonly in the rectum, vagina, and intestines. In people who are healthy and not pregnant, the bacteria rarely cause serious illness or complications. However, women who test positive for GBS during pregnancy can pass the bacteria to their baby during childbirth, which can cause serious infection in the baby after birth. Women with GBS may also have infections during their pregnancy or immediately after childbirth, such as such as urinary tract infections (UTIs) or infections of the uterus (uterine infections). Having GBS also increases a woman's risk of complications during pregnancy, such as early (preterm) labor or delivery, miscarriage, or stillbirth. Routine testing (screening) for GBS is recommended for all pregnant women. What increases the risk? You may have a higher risk for GBS infection during pregnancy if you had one during a past pregnancy. What are the signs or symptoms? In most cases, GBS infection does not cause symptoms in pregnant women. Signs and symptoms of a possible GBS-related infection may include:  Labor starting before the 37th week of pregnancy.  A UTI or bladder infection, which may cause:  Fever.  Pain or burning during urination.  Frequent urination.  Fever during labor, along with:  Bad-smelling discharge.  Uterine tenderness.  Rapid heartbeat in the mother, baby, or both. Rare but serious symptoms of a possible GBS-related infection in women include:  Blood infection (septicemia). This may cause fever, chills, or confusion.  Lung infection (pneumonia). This may cause fever, chills, cough, rapid breathing, difficulty breathing, or chest pain.  Bone, joint, skin, or soft tissue  infection. How is this diagnosed? You may be screened for GBS between week 35 and week 37 of your pregnancy. If you have symptoms of preterm labor, you may be screened earlier. This condition is diagnosed based on lab test results from:  A swab of fluid from the vagina and rectum.  A urine sample. How is this treated? This condition is treated with antibiotic medicine. When you go into labor, or as soon as your water breaks (your membranes rupture), you will be given antibiotics through an IV tube. Antibiotics will continue until after you give birth. If you are having a cesarean delivery, you do not need antibiotics unless your membranes have already ruptured. Follow these instructions at home:  Take over-the-counter and prescription medicines only as told by your health care provider.  Take your antibiotic medicine as told by your health care provider. Do not stop taking the antibiotic even if you start to feel better.  Keep all pre-birth (prenatal) visits and follow-up visits as told by your health care provider. This is important. Contact a health care provider if:  You have pain or burning when you urinate.  You have to urinate frequently.  You have a fever or chills.  You develop a bad-smelling vaginal discharge. Get help right away if:  Your membranes rupture.  You go into labor.  You have severe pain in your abdomen.  You have difficulty breathing.  You have chest pain. This information is not intended to replace advice given to you by your health care provider. Make sure you discuss any questions you have with your health care provider. Document Released: 03/25/2008 Document Revised: 07/13/2016 Document Reviewed: 07/12/2016 Elsevier Interactive Patient Education  2017 [employer].

## 2017-04-11 ENCOUNTER — Ambulatory Visit (INDEPENDENT_AMBULATORY_CARE_PROVIDER_SITE_OTHER): Payer: Medicaid Other | Admitting: Medical

## 2017-04-11 VITALS — BP 131/65 | HR 74 | Wt 193.6 lb

## 2017-04-11 DIAGNOSIS — O99820 Streptococcus B carrier state complicating pregnancy: Secondary | ICD-10-CM

## 2017-04-11 DIAGNOSIS — O0993 Supervision of high risk pregnancy, unspecified, third trimester: Secondary | ICD-10-CM

## 2017-04-11 DIAGNOSIS — Z8619 Personal history of other infectious and parasitic diseases: Secondary | ICD-10-CM

## 2017-04-11 NOTE — Progress Notes (Signed)
   PRENATAL VISIT NOTE  Subjective:  Carolyn Blake is a 27 y.o. G1P0000 at 35w4dbeing seen today for ongoing prenatal care.  She is currently monitored for the following issues for this low-risk pregnancy and has Anemia; Supervision of high risk pregnancy, antepartum; [place]cerative [place] ([place]; History of PCR DNA positive for HSV2; Sickle cell trait ([place]; History of sexually transmitted disease (CT and trich); and Group B Streptococcus carrier, +RV culture, currently pregnant on her problem list.  Patient reports no complaints.  Contractions: Not present. Vag. Bleeding: None.  Movement: Present. Denies leaking of fluid.   The following portions of the patient's history were reviewed and updated as appropriate: allergies, current medications, past family history, past medical history, past social history, past surgical history and problem list. Problem list updated.  Objective:   Vitals:   04/11/17 0743  BP: 131/65  Pulse: 74  Weight: 193 lb 9.6 oz (87.8 kg)    Fetal Status: Fetal Heart Rate (bpm): 122 Fundal Height: 38 cm Movement: Present     General:  Alert, oriented and cooperative. Patient is in no acute distress.  Skin: Skin is warm and dry. No rash noted.   Cardiovascular: Normal heart rate noted  Respiratory: Normal respiratory effort, no problems with respiration noted  Abdomen: Soft, gravid, appropriate for gestational age. Pain/Pressure: Present     Pelvic:  Cervical exam deferred        Extremities: Normal range of motion.  Edema: None  Mental Status: Normal mood and affect. Normal behavior. Normal judgment and thought content.   Assessment and Plan:  Pregnancy: G1P0000 at 373w4d1. Group B Streptococcus carrier, +RV culture, currently pregnant - treat in labor  2. 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 Supervision of high risk pregnancy in third trimester   Term labor symptoms and general obstetric precautions including but  not limited to vaginal bleeding, contractions, leaking of fluid and fetal movement were reviewed in detail with the patient. Please refer to After Visit Summary for other counseling recommendations.  Return in about 1 week (around 04/18/2017) for LOB with NST.   JuLuvenia ReddenPA-C

## 2017-04-11 NOTE — Patient Instructions (Signed)
Fetal Movement Counts Patient Name: ________________________________________________ Patient Due Date: ____________________ What is a fetal movement count? A fetal movement count is the number of times that you feel your baby move during a certain amount of time. This may also be called a fetal kick count. A fetal movement count is recommended for every pregnant woman. You may be asked to start counting fetal movements as early as week 28 of your pregnancy. Pay attention to when your baby is most active. You may notice your baby's sleep and wake cycles. You may also notice things that make your baby move more. You should do a fetal movement count:  When your baby is normally most active.  At the same time each day. A good time to count movements is while you are resting, after having something to eat and drink. How do I count fetal movements? 1. Find a quiet, comfortable area. Sit, or lie down on your side. 2. Write down the date, the start time and stop time, and the number of movements that you felt between those two times. Take this information with you to your health care visits. 3. For 2 hours, count kicks, flutters, swishes, rolls, and jabs. You should feel at least 10 movements during 2 hours. 4. You may stop counting after you have felt 10 movements. 5. If you do not feel 10 movements in 2 hours, have something to eat and drink. Then, keep resting and counting for 1 hour. If you feel at least 4 movements during that hour, you may stop counting. Contact a health care provider if:  You feel fewer than 4 movements in 2 hours.  Your baby is not moving like he or she usually does. Date: ____________ Start time: ____________ Stop time: ____________ Movements: ____________ Date: ____________ Start time: ____________ Stop time: ____________ Movements: ____________ Date: ____________ Start time: ____________ Stop time: ____________ Movements: ____________ Date: ____________ Start time:  ____________ Stop time: ____________ Movements: ____________ Date: ____________ Start time: ____________ Stop time: ____________ Movements: ____________ Date: ____________ Start time: ____________ Stop time: ____________ Movements: ____________ Date: ____________ Start time: ____________ Stop time: ____________ Movements: ____________ Date: ____________ Start time: ____________ Stop time: ____________ Movements: ____________ Date: ____________ Start time: ____________ Stop time: ____________ Movements: ____________ This information is not intended to replace advice given to you by your health care provider. Make sure you discuss any questions you have with your health care provider. Document Released: 01/16/2007 Document Revised: 08/15/2016 Document Reviewed: 01/26/2016 Elsevier Interactive Patient Education  2017 [employer] of the uterus can occur throughout pregnancy, but they are not always a sign that you are in labor. You may have practice contractions called Braxton Hicks contractions. These false labor contractions are sometimes confused with true labor. What are [place] contractions? Braxton Hicks contractions are tightening movements that occur in the muscles of the uterus before labor. Unlike true labor contractions, these contractions do not result in opening (dilation) and thinning of the cervix. Toward the end of pregnancy (32-34 weeks), Braxton Hicks contractions can happen more often and may become stronger. These contractions are sometimes difficult to tell apart from true labor because they can be very uncomfortable. You should not feel embarrassed if you go to the hospital with false labor. Sometimes, the only way to tell if you are in true labor is for your health care provider to look for changes in the cervix. The health care provider will do a physical exam and may monitor your contractions. If you  are not in true labor, the exam  should show that your cervix is not dilating and your water has not broken. If there are no prenatal problems or other health problems associated with your pregnancy, it is completely safe for you to be sent home with false labor. You may continue to have Braxton Hicks contractions until you go into true labor. How can I tell the difference between true labor and false labor?  Differences  False labor  Contractions last 30-70 seconds.: Contractions are usually shorter and not as strong as true labor contractions.  Contractions become very regular.: Contractions are usually irregular.  Discomfort is usually felt in the top of the uterus, and it spreads to the lower abdomen and low back.: Contractions are often felt in the front of the lower abdomen and in the groin.  Contractions do not go away with walking.: Contractions may go away when you walk around or change positions while lying down.  Contractions usually become more intense and increase in frequency.: Contractions get weaker and are shorter-lasting as time goes on.  The cervix dilates and gets thinner.: The cervix usually does not dilate or become thin. Follow these instructions at home:  Take over-the-counter and prescription medicines only as told by your health care provider.  Keep up with your usual exercises and follow other instructions from your health care provider.  Eat and drink lightly if you think you are going into labor.  If Braxton Hicks contractions are making you uncomfortable:  Change your position from lying down or resting to walking, or change from walking to resting.  Sit and rest in a tub of warm water.  Drink enough fluid to keep your urine clear or pale yellow. Dehydration may cause these contractions.  Do slow and deep breathing several times an hour.  Keep all follow-up prenatal visits as told by your health care provider. This is important. Contact a health care provider if:  You have a  fever.  You have continuous pain in your abdomen. Get help right away if:  Your contractions become stronger, more regular, and closer together.  You have fluid leaking or gushing from your vagina.  You pass blood-tinged mucus (bloody show).  You have bleeding from your vagina.  You have low back pain that you never had before.  You feel your baby's head pushing down and causing pelvic pressure.  Your baby is not moving inside you as much as it used to. Summary  Contractions that occur before labor are called Braxton Hicks contractions, false labor, or practice contractions.  Braxton Hicks contractions are usually shorter, weaker, farther apart, and less regular than true labor contractions. True labor contractions usually become progressively stronger and regular and they become more frequent.  Manage discomfort from [facility] contractions by changing position, resting in a warm bath, drinking plenty of water, or practicing deep breathing. This information is not intended to replace advice given to you by your health care provider. Make sure you discuss any questions you have with your health care provider. Document Released: 12/17/2005 Document Revised: 11/05/2016 Document Reviewed: 11/05/2016 Elsevier Interactive Patient Education  2017 [employer].

## 2017-04-18 ENCOUNTER — Ambulatory Visit: Payer: Medicaid Other | Admitting: Student

## 2017-04-18 ENCOUNTER — Telehealth (HOSPITAL_COMMUNITY): Payer: Self-pay | Admitting: *Deleted

## 2017-04-18 ENCOUNTER — Ambulatory Visit: Payer: Self-pay

## 2017-04-18 ENCOUNTER — Encounter (HOSPITAL_COMMUNITY): Payer: Self-pay | Admitting: *Deleted

## 2017-04-18 ENCOUNTER — Other Ambulatory Visit: Payer: Self-pay | Admitting: Student

## 2017-04-18 VITALS — BP 129/77 | HR 87 | Wt 194.7 lb

## 2017-04-18 DIAGNOSIS — O0990 Supervision of high risk pregnancy, unspecified, unspecified trimester: Secondary | ICD-10-CM

## 2017-04-18 DIAGNOSIS — O480 Post-term pregnancy: Secondary | ICD-10-CM

## 2017-04-18 NOTE — Addendum Note (Signed)
Addended by: Langston Reusing on: 04/18/2017 10:36 AM   Modules accepted: Orders

## 2017-04-18 NOTE — Addendum Note (Signed)
Addended by: Carolynne Edouard on: 04/18/2017 05:05 PM   Modules accepted: Orders, SmartSet

## 2017-04-18 NOTE — Telephone Encounter (Signed)
Preadmission screen  

## 2017-04-18 NOTE — Progress Notes (Addendum)
   PRENATAL VISIT NOTE  Subjective:  Carolyn Blake is a 27 y.o. G1P0000 at 27w4dbeing seen today for ongoing prenatal care.  She is currently monitored for the following issues for this high-risk pregnancy and has Anemia; Supervision of high risk pregnancy, antepartum; Ulcerative colitis ([place]; History of PCR DNA positive for HSV2; Sickle cell trait ([place]; History of sexually transmitted disease (CT and trich); and Group B Streptococcus carrier, +RV culture, currently pregnant on her problem list.  Patient reports no complaints.  Contractions: Irregular. Vag. Bleeding: None.  Movement: Present. Denies leaking of fluid.   The following portions of the patient's history were reviewed and updated as appropriate: allergies, current medications, past family history, past medical history, past social history, past surgical history and problem list. Problem list updated.  Objective:   Vitals:   04/18/17 0916  BP: 129/77  Pulse: 87  Weight: 88.3 kg (194 lb 11.2 oz)    Fetal Status: Fetal Heart Rate (bpm): 148 Fundal Height: 41 cm Movement: Present     General:  Alert, oriented and cooperative. Patient is in no acute distress.  Skin: Skin is warm and dry. No rash noted.   Cardiovascular: Normal heart rate noted  Respiratory: Normal respiratory effort, no problems with respiration noted  Abdomen: Soft, gravid, appropriate for gestational age. Pain/Pressure: Absent     Pelvic:  Cervical exam performed Dilation: Fingertip      Extremities: Normal range of motion.  Edema: None  Mental Status: Normal mood and affect. Normal behavior. Normal judgment and thought content.   Assessment and Plan:  Pregnancy: G1P0000 at 473w4d1. Supervision of high risk pregnancy, antepartum NST today-reactive with AFI of 6.6  -Cytotec Induction scheduled for 04-21-2017 -Still taking valtrex, no recent Herpes outbreaks and UC under control.  -NST today: reactive  Term labor symptoms and general obstetric  precautions including but not limited to vaginal bleeding, contractions, leaking of fluid and fetal movement were reviewed in detail with the patient. Please refer to After Visit Summary for other counseling recommendations.     KaStarr LakeCNM

## 2017-04-21 ENCOUNTER — Inpatient Hospital Stay (HOSPITAL_COMMUNITY): Payer: Medicaid Other | Admitting: Anesthesiology

## 2017-04-21 ENCOUNTER — Encounter (HOSPITAL_COMMUNITY): Payer: Self-pay

## 2017-04-21 ENCOUNTER — Inpatient Hospital Stay (HOSPITAL_COMMUNITY)
Admission: RE | Admit: 2017-04-21 | Discharge: 2017-04-23 | DRG: 765 | Disposition: A | Discharge status: Home / Self Care | Payer: Medicaid Other | Source: Ambulatory Visit | Attending: Obstetrics & Gynecology | Admitting: Obstetrics & Gynecology

## 2017-04-21 ENCOUNTER — Encounter (HOSPITAL_COMMUNITY)
Admission: RE | Disposition: A | Discharge status: Home / Self Care | Payer: Self-pay | Source: Ambulatory Visit | Attending: Obstetrics & Gynecology

## 2017-04-21 DIAGNOSIS — O99824 Streptococcus B carrier state complicating childbirth: Secondary | ICD-10-CM | POA: Diagnosis present

## 2017-04-21 DIAGNOSIS — E669 Obesity, unspecified: Secondary | ICD-10-CM | POA: Diagnosis present

## 2017-04-21 DIAGNOSIS — Z8249 Family history of ischemic heart disease and other diseases of the circulatory system: Secondary | ICD-10-CM | POA: Diagnosis not present

## 2017-04-21 DIAGNOSIS — O480 Post-term pregnancy: Secondary | ICD-10-CM | POA: Diagnosis present

## 2017-04-21 DIAGNOSIS — Z6833 Body mass index (BMI) 33.0-33.9, adult: Secondary | ICD-10-CM

## 2017-04-21 DIAGNOSIS — O99214 Obesity complicating childbirth: Secondary | ICD-10-CM | POA: Diagnosis present

## 2017-04-21 DIAGNOSIS — O9832 Other infections with a predominantly sexual mode of transmission complicating childbirth: Secondary | ICD-10-CM | POA: Diagnosis present

## 2017-04-21 DIAGNOSIS — Z833 Family history of diabetes mellitus: Secondary | ICD-10-CM

## 2017-04-21 DIAGNOSIS — A6000 Herpesviral infection of urogenital system, unspecified: Secondary | ICD-10-CM | POA: Diagnosis present

## 2017-04-21 DIAGNOSIS — Z3A41 41 weeks gestation of pregnancy: Secondary | ICD-10-CM

## 2017-04-21 DIAGNOSIS — O99820 Streptococcus B carrier state complicating pregnancy: Secondary | ICD-10-CM

## 2017-04-21 LAB — CBC
HCT: 35 % — ABNORMAL LOW (ref 36.0–46.0)
Hemoglobin: 11.9 g/dL — ABNORMAL LOW (ref 12.0–15.0)
MCH: 30 pg (ref 26.0–34.0)
MCHC: 34 g/dL (ref 30.0–36.0)
MCV: 88.2 fL (ref 78.0–100.0)
Platelets: 198 10*3/uL (ref 150–400)
RBC: 3.97 MIL/uL (ref 3.87–5.11)
RDW: 13.4 % (ref 11.5–15.5)
WBC: 8.6 10*3/uL (ref 4.0–10.5)

## 2017-04-21 LAB — RPR: RPR Ser Ql: NONREACTIVE

## 2017-04-21 SURGERY — Surgical Case
Anesthesia: Epidural

## 2017-04-21 MED ORDER — NALBUPHINE HCL 10 MG/ML IJ SOLN
5.0000 mg | INTRAMUSCULAR | Status: DC | PRN
  Filled 2017-04-21: qty 1

## 2017-04-21 MED ORDER — EPHEDRINE 5 MG/ML INJ: 10.0000 mg | INTRAVENOUS | Status: DC | PRN

## 2017-04-21 MED ORDER — BUPIVACAINE HCL (PF) 0.5 % IJ SOLN
INTRAMUSCULAR | Status: AC
  Filled 2017-04-21: qty 30

## 2017-04-21 MED ORDER — MISOPROSTOL 25 MCG QUARTER TABLET
25.0000 ug | ORAL_TABLET | ORAL | Status: DC | PRN
  Administered 2017-04-21: 25 ug via VAGINAL
  Filled 2017-04-21 (×2): qty 1

## 2017-04-21 MED ORDER — MEASLES, MUMPS & RUBELLA VAC ~~LOC~~ INJ
0.5000 mL | INJECTION | Freq: Once | SUBCUTANEOUS | Status: DC
  Filled 2017-04-21: qty 0.5

## 2017-04-21 MED ORDER — MENTHOL 3 MG MT LOZG: 1.0000 | LOZENGE | OROMUCOSAL | Status: DC | PRN

## 2017-04-21 MED ORDER — PHENYLEPHRINE 40 MCG/ML (10ML) SYRINGE FOR IV PUSH (FOR BLOOD PRESSURE SUPPORT)
80.0000 ug | PREFILLED_SYRINGE | INTRAVENOUS | Status: DC | PRN
Start: 2017-04-21 — End: 2017-04-21

## 2017-04-21 MED ORDER — DEXAMETHASONE SODIUM PHOSPHATE 4 MG/ML IJ SOLN
INTRAMUSCULAR | Status: DC | PRN
  Administered 2017-04-21: 4 mg via INTRAVENOUS

## 2017-04-21 MED ORDER — DIPHENHYDRAMINE HCL 25 MG PO CAPS: 25.0000 mg | ORAL_CAPSULE | Freq: Four times a day (QID) | ORAL | Status: DC | PRN

## 2017-04-21 MED ORDER — TETANUS-DIPHTH-ACELL PERTUSSIS 5-2.5-18.5 LF-MCG/0.5 IM SUSP: 0.5000 mL | Freq: Once | INTRAMUSCULAR | Status: DC

## 2017-04-21 MED ORDER — COCONUT OIL OIL: 1.0000 "application " | TOPICAL_OIL | Status: DC | PRN

## 2017-04-21 MED ORDER — DIBUCAINE 1 % RE OINT: 1.0000 "application " | TOPICAL_OINTMENT | RECTAL | Status: DC | PRN

## 2017-04-21 MED ORDER — OXYTOCIN BOLUS FROM INFUSION: 500.0000 mL | Freq: Once | INTRAVENOUS | Status: DC

## 2017-04-21 MED ORDER — SODIUM CHLORIDE 0.9 % IR SOLN
Status: DC | PRN
  Administered 2017-04-21: 1000 mL

## 2017-04-21 MED ORDER — SIMETHICONE 80 MG PO CHEW: 80.0000 mg | CHEWABLE_TABLET | ORAL | Status: DC | PRN

## 2017-04-21 MED ORDER — FENTANYL CITRATE (PF) 100 MCG/2ML IJ SOLN: 25.0000 ug | INTRAMUSCULAR | Status: DC | PRN

## 2017-04-21 MED ORDER — SCOPOLAMINE 1 MG/3DAYS TD PT72
MEDICATED_PATCH | TRANSDERMAL | Status: AC
  Filled 2017-04-21: qty 1

## 2017-04-21 MED ORDER — ONDANSETRON HCL 4 MG/2ML IJ SOLN
INTRAMUSCULAR | Status: DC | PRN
  Administered 2017-04-21: 4 mg via INTRAVENOUS

## 2017-04-21 MED ORDER — DIPHENHYDRAMINE HCL 50 MG/ML IJ SOLN: 12.5000 mg | INTRAMUSCULAR | Status: DC | PRN

## 2017-04-21 MED ORDER — EPHEDRINE 5 MG/ML INJ
10.0000 mg | INTRAVENOUS | Status: DC | PRN
Start: 2017-04-21 — End: 2017-04-21

## 2017-04-21 MED ORDER — OXYTOCIN 10 UNIT/ML IJ SOLN
INTRAVENOUS | Status: DC | PRN
  Administered 2017-04-21: 40 [IU] via INTRAVENOUS

## 2017-04-21 MED ORDER — OXYTOCIN 40 UNITS IN LACTATED RINGERS INFUSION - SIMPLE MED: 2.5000 [IU]/h | INTRAVENOUS | Status: DC

## 2017-04-21 MED ORDER — KETOROLAC TROMETHAMINE 30 MG/ML IJ SOLN
30.0000 mg | Freq: Four times a day (QID) | INTRAMUSCULAR | Status: AC | PRN
  Administered 2017-04-21: 30 mg via INTRAMUSCULAR

## 2017-04-21 MED ORDER — ACETAMINOPHEN 325 MG PO TABS
650.0000 mg | ORAL_TABLET | ORAL | Status: DC | PRN
  Administered 2017-04-22: 650 mg via ORAL
  Filled 2017-04-21: qty 2

## 2017-04-21 MED ORDER — PENICILLIN G POT IN DEXTROSE 60000 UNIT/ML IV SOLN
3.0000 10*6.[IU] | INTRAVENOUS | Status: DC
  Administered 2017-04-21: 3 10*6.[IU] via INTRAVENOUS
  Filled 2017-04-21 (×4): qty 50

## 2017-04-21 MED ORDER — OXYCODONE-ACETAMINOPHEN 5-325 MG PO TABS: 1.0000 | ORAL_TABLET | ORAL | Status: DC | PRN

## 2017-04-21 MED ORDER — FENTANYL 2.5 MCG/ML BUPIVACAINE 1/10 % EPIDURAL INFUSION (WH - ANES): 14.0000 mL/h | INTRAMUSCULAR | Status: DC | PRN

## 2017-04-21 MED ORDER — TERBUTALINE SULFATE 1 MG/ML IJ SOLN
0.2500 mg | Freq: Once | INTRAMUSCULAR | Status: DC | PRN
  Filled 2017-04-21: qty 1

## 2017-04-21 MED ORDER — PHENYLEPHRINE HCL 10 MG/ML IJ SOLN
INTRAMUSCULAR | Status: DC | PRN
  Administered 2017-04-21: 80 ug via INTRAVENOUS

## 2017-04-21 MED ORDER — SCOPOLAMINE 1 MG/3DAYS TD PT72
MEDICATED_PATCH | TRANSDERMAL | Status: DC | PRN
  Administered 2017-04-21: 1 via TRANSDERMAL

## 2017-04-21 MED ORDER — NALOXONE HCL 2 MG/2ML IJ SOSY
1.0000 ug/kg/h | PREFILLED_SYRINGE | INTRAVENOUS | Status: DC | PRN
  Filled 2017-04-21: qty 2

## 2017-04-21 MED ORDER — KETOROLAC TROMETHAMINE 30 MG/ML IJ SOLN: 30.0000 mg | Freq: Four times a day (QID) | INTRAMUSCULAR | Status: AC | PRN

## 2017-04-21 MED ORDER — DEXAMETHASONE SODIUM PHOSPHATE 4 MG/ML IJ SOLN
INTRAMUSCULAR | Status: AC
  Filled 2017-04-21: qty 1

## 2017-04-21 MED ORDER — FERROUS SULFATE 325 (65 FE) MG PO TABS
325.0000 mg | ORAL_TABLET | Freq: Two times a day (BID) | ORAL | Status: DC
  Administered 2017-04-22 – 2017-04-23 (×3): 325 mg via ORAL
  Filled 2017-04-21 (×3): qty 1

## 2017-04-21 MED ORDER — BUPIVACAINE HCL (PF) 0.5 % IJ SOLN
INTRAMUSCULAR | Status: DC | PRN
  Administered 2017-04-21: 30 mL

## 2017-04-21 MED ORDER — LIDOCAINE HCL (PF) 1 % IJ SOLN: 30.0000 mL | INTRAMUSCULAR | Status: DC | PRN

## 2017-04-21 MED ORDER — LACTATED RINGERS IV SOLN
INTRAVENOUS | Status: DC | PRN
  Administered 2017-04-21 (×2): via INTRAVENOUS

## 2017-04-21 MED ORDER — WITCH HAZEL-GLYCERIN EX PADS: 1.0000 "application " | MEDICATED_PAD | CUTANEOUS | Status: DC | PRN

## 2017-04-21 MED ORDER — DIPHENHYDRAMINE HCL 25 MG PO CAPS: 25.0000 mg | ORAL_CAPSULE | ORAL | Status: DC | PRN

## 2017-04-21 MED ORDER — ONDANSETRON HCL 4 MG/2ML IJ SOLN
INTRAMUSCULAR | Status: AC
  Filled 2017-04-21: qty 2

## 2017-04-21 MED ORDER — LACTATED RINGERS IV SOLN
INTRAVENOUS | Status: DC
  Administered 2017-04-22: 1000 mL via INTRAVENOUS

## 2017-04-21 MED ORDER — MORPHINE SULFATE (PF) 0.5 MG/ML IJ SOLN
INTRAMUSCULAR | Status: DC | PRN
  Administered 2017-04-21: 3 mg via EPIDURAL

## 2017-04-21 MED ORDER — OXYCODONE-ACETAMINOPHEN 5-325 MG PO TABS
1.0000 | ORAL_TABLET | ORAL | Status: DC | PRN
Start: 2017-04-21 — End: 2017-04-23
  Filled 2017-04-21: qty 1

## 2017-04-21 MED ORDER — FENTANYL CITRATE (PF) 100 MCG/2ML IJ SOLN
INTRAMUSCULAR | Status: AC
  Filled 2017-04-21: qty 2

## 2017-04-21 MED ORDER — OXYCODONE-ACETAMINOPHEN 5-325 MG PO TABS: 2.0000 | ORAL_TABLET | ORAL | Status: DC | PRN

## 2017-04-21 MED ORDER — MEPERIDINE HCL 25 MG/ML IJ SOLN: 6.2500 mg | INTRAMUSCULAR | Status: DC | PRN

## 2017-04-21 MED ORDER — FLEET ENEMA 7-19 GM/118ML RE ENEM: 1.0000 | ENEMA | RECTAL | Status: DC | PRN

## 2017-04-21 MED ORDER — SODIUM CHLORIDE 0.9% FLUSH: 3.0000 mL | INTRAVENOUS | Status: DC | PRN

## 2017-04-21 MED ORDER — LACTATED RINGERS IV SOLN: 500.0000 mL | Freq: Once | INTRAVENOUS | Status: DC

## 2017-04-21 MED ORDER — NALBUPHINE HCL 10 MG/ML IJ SOLN: 5.0000 mg | Freq: Once | INTRAMUSCULAR | Status: AC | PRN

## 2017-04-21 MED ORDER — PRENATAL MULTIVITAMIN CH
1.0000 | ORAL_TABLET | Freq: Every day | ORAL | Status: DC
  Administered 2017-04-22 – 2017-04-23 (×2): 1 via ORAL
  Filled 2017-04-21 (×2): qty 1

## 2017-04-21 MED ORDER — SENNOSIDES-DOCUSATE SODIUM 8.6-50 MG PO TABS
2.0000 | ORAL_TABLET | ORAL | Status: DC
  Administered 2017-04-22 (×2): 2 via ORAL
  Filled 2017-04-21 (×2): qty 2

## 2017-04-21 MED ORDER — CEFAZOLIN SODIUM-DEXTROSE 2-3 GM-% IV SOLR
INTRAVENOUS | Status: DC | PRN
  Administered 2017-04-21: 2 g via INTRAVENOUS

## 2017-04-21 MED ORDER — NALBUPHINE HCL 10 MG/ML IJ SOLN
5.0000 mg | Freq: Once | INTRAMUSCULAR | Status: AC | PRN
  Administered 2017-04-21: 5 mg via INTRAVENOUS

## 2017-04-21 MED ORDER — OXYTOCIN 10 UNIT/ML IJ SOLN
INTRAMUSCULAR | Status: AC
  Filled 2017-04-21: qty 4

## 2017-04-21 MED ORDER — LACTATED RINGERS IV SOLN: 500.0000 mL | INTRAVENOUS | Status: DC | PRN

## 2017-04-21 MED ORDER — NALOXONE HCL 0.4 MG/ML IJ SOLN: 0.4000 mg | INTRAMUSCULAR | Status: DC | PRN

## 2017-04-21 MED ORDER — METOCLOPRAMIDE HCL 5 MG/ML IJ SOLN: 10.0000 mg | Freq: Once | INTRAMUSCULAR | Status: DC | PRN

## 2017-04-21 MED ORDER — ONDANSETRON HCL 4 MG/2ML IJ SOLN: 4.0000 mg | Freq: Three times a day (TID) | INTRAMUSCULAR | Status: DC | PRN

## 2017-04-21 MED ORDER — FENTANYL CITRATE (PF) 100 MCG/2ML IJ SOLN
INTRAMUSCULAR | Status: DC | PRN
  Administered 2017-04-21 (×2): 50 ug via INTRAVENOUS

## 2017-04-21 MED ORDER — LACTATED RINGERS IV SOLN
INTRAVENOUS | Status: DC | PRN
  Administered 2017-04-21: 15:00:00 via INTRAVENOUS

## 2017-04-21 MED ORDER — ACETAMINOPHEN 500 MG PO TABS
1000.0000 mg | ORAL_TABLET | Freq: Four times a day (QID) | ORAL | Status: AC
  Administered 2017-04-21 – 2017-04-22 (×4): 1000 mg via ORAL
  Filled 2017-04-21 (×4): qty 2

## 2017-04-21 MED ORDER — PHENYLEPHRINE 40 MCG/ML (10ML) SYRINGE FOR IV PUSH (FOR BLOOD PRESSURE SUPPORT): 80.0000 ug | PREFILLED_SYRINGE | INTRAVENOUS | Status: DC | PRN

## 2017-04-21 MED ORDER — IBUPROFEN 600 MG PO TABS
600.0000 mg | ORAL_TABLET | Freq: Four times a day (QID) | ORAL | Status: DC | PRN
  Administered 2017-04-22: 600 mg via ORAL

## 2017-04-21 MED ORDER — MORPHINE SULFATE (PF) 0.5 MG/ML IJ SOLN
INTRAMUSCULAR | Status: AC
  Filled 2017-04-21: qty 10

## 2017-04-21 MED ORDER — FENTANYL CITRATE (PF) 100 MCG/2ML IJ SOLN: 100.0000 ug | INTRAMUSCULAR | Status: DC | PRN

## 2017-04-21 MED ORDER — IBUPROFEN 600 MG PO TABS
600.0000 mg | ORAL_TABLET | Freq: Four times a day (QID) | ORAL | Status: DC
  Administered 2017-04-22 – 2017-04-23 (×6): 600 mg via ORAL
  Filled 2017-04-21 (×7): qty 1

## 2017-04-21 MED ORDER — ONDANSETRON HCL 4 MG/2ML IJ SOLN: 4.0000 mg | Freq: Four times a day (QID) | INTRAMUSCULAR | Status: DC | PRN

## 2017-04-21 MED ORDER — SODIUM BICARBONATE 8.4 % IV SOLN
INTRAVENOUS | Status: DC | PRN
  Administered 2017-04-21 (×4): 5 mL via EPIDURAL

## 2017-04-21 MED ORDER — OXYTOCIN 40 UNITS IN LACTATED RINGERS INFUSION - SIMPLE MED: 2.5000 [IU]/h | INTRAVENOUS | Status: AC

## 2017-04-21 MED ORDER — NALBUPHINE HCL 10 MG/ML IJ SOLN
5.0000 mg | INTRAMUSCULAR | Status: DC | PRN
  Administered 2017-04-21: 5 mg via SUBCUTANEOUS
  Filled 2017-04-21: qty 1

## 2017-04-21 MED ORDER — CEFAZOLIN SODIUM-DEXTROSE 2-4 GM/100ML-% IV SOLN
INTRAVENOUS | Status: AC
  Filled 2017-04-21: qty 100

## 2017-04-21 MED ORDER — MAGNESIUM HYDROXIDE 400 MG/5ML PO SUSP: 30.0000 mL | ORAL | Status: DC | PRN

## 2017-04-21 MED ORDER — PENICILLIN G POTASSIUM 5000000 UNITS IJ SOLR
5.0000 10*6.[IU] | Freq: Once | INTRAVENOUS | Status: AC
  Administered 2017-04-21: 5 10*6.[IU] via INTRAVENOUS
  Filled 2017-04-21: qty 5

## 2017-04-21 MED ORDER — SOD CITRATE-CITRIC ACID 500-334 MG/5ML PO SOLN
30.0000 mL | ORAL | Status: DC | PRN
  Administered 2017-04-21: 30 mL via ORAL
  Filled 2017-04-21: qty 15

## 2017-04-21 MED ORDER — ACETAMINOPHEN 325 MG PO TABS: 650.0000 mg | ORAL_TABLET | ORAL | Status: DC | PRN

## 2017-04-21 MED ORDER — LACTATED RINGERS IV SOLN
INTRAVENOUS | Status: DC
  Administered 2017-04-21: 09:00:00 via INTRAVENOUS

## 2017-04-21 MED ORDER — KETOROLAC TROMETHAMINE 30 MG/ML IJ SOLN
INTRAMUSCULAR | Status: AC
  Filled 2017-04-21: qty 1

## 2017-04-21 MED ORDER — SIMETHICONE 80 MG PO CHEW
80.0000 mg | CHEWABLE_TABLET | ORAL | Status: DC
  Administered 2017-04-22 (×2): 80 mg via ORAL
  Filled 2017-04-21 (×2): qty 1

## 2017-04-21 MED ORDER — ZOLPIDEM TARTRATE 5 MG PO TABS: 5.0000 mg | ORAL_TABLET | Freq: Every evening | ORAL | Status: DC | PRN

## 2017-04-21 SURGICAL SUPPLY — 38 items
BENZOIN TINCTURE PRP APPL 2/3 (GAUZE/BANDAGES/DRESSINGS) ×3 IMPLANT
CHLORAPREP W/TINT 26ML (MISCELLANEOUS) ×3 IMPLANT
CLAMP CORD UMBIL (MISCELLANEOUS) IMPLANT
CLOSURE STERI STRIP 1/2 X4 (GAUZE/BANDAGES/DRESSINGS) ×3 IMPLANT
CLOTH BEACON ORANGE TIMEOUT ST (SAFETY) ×3 IMPLANT
DRSG OPSITE POSTOP 4X10 (GAUZE/BANDAGES/DRESSINGS) ×3 IMPLANT
ELECT REM PT RETURN 9FT ADLT (ELECTROSURGICAL) ×3
ELECTRODE REM PT RTRN 9FT ADLT (ELECTROSURGICAL) ×1 IMPLANT
EXTRACTOR VACUUM M CUP 4 TUBE (SUCTIONS) IMPLANT
EXTRACTOR VACUUM M CUP 4' TUBE (SUCTIONS)
GAUZE SPONGE 4X4 12PLY STRL LF (GAUZE/BANDAGES/DRESSINGS) ×6 IMPLANT
GLOVE BIOGEL PI IND STRL 7.0 (GLOVE) ×3 IMPLANT
GLOVE BIOGEL PI INDICATOR 7.0 (GLOVE) ×6
GLOVE ECLIPSE 7.0 STRL STRAW (GLOVE) ×3 IMPLANT
GOWN STRL REUS W/TWL LRG LVL3 (GOWN DISPOSABLE) ×6 IMPLANT
HEMOSTAT SURGICEL 2X3 (HEMOSTASIS) ×3 IMPLANT
KIT ABG SYR 3ML LUER SLIP (SYRINGE) IMPLANT
NEEDLE HYPO 22GX1.5 SAFETY (NEEDLE) ×3 IMPLANT
NEEDLE HYPO 25X5/8 SAFETYGLIDE (NEEDLE) ×3 IMPLANT
NS IRRIG 1000ML POUR BTL (IV SOLUTION) ×3 IMPLANT
PACK C SECTION WH (CUSTOM PROCEDURE TRAY) ×3 IMPLANT
PAD ABD 7.5X8 STRL (GAUZE/BANDAGES/DRESSINGS) ×3 IMPLANT
PAD OB MATERNITY 4.3X12.25 (PERSONAL CARE ITEMS) ×3 IMPLANT
PENCIL SMOKE EVAC W/HOLSTER (ELECTROSURGICAL) ×3 IMPLANT
RTRCTR C-SECT PINK 25CM LRG (MISCELLANEOUS) IMPLANT
SPONGE LAP 18X18 X RAY DECT (DISPOSABLE) ×3 IMPLANT
SUT PDS AB 0 CTX 36 PDP370T (SUTURE) IMPLANT
SUT PLAIN 2 0 XLH (SUTURE) IMPLANT
SUT VIC AB 0 CTX 36 (SUTURE) ×4
SUT VIC AB 0 CTX36XBRD ANBCTRL (SUTURE) ×2 IMPLANT
SUT VIC AB 2-0 CT1 27 (SUTURE) ×2
SUT VIC AB 2-0 CT1 TAPERPNT 27 (SUTURE) ×1 IMPLANT
SUT VIC AB 3-0 SH 27 (SUTURE) ×2
SUT VIC AB 3-0 SH 27X BRD (SUTURE) ×1 IMPLANT
SUT VIC AB 4-0 KS 27 (SUTURE) ×3 IMPLANT
SYR CONTROL 10ML LL (SYRINGE) ×3 IMPLANT
TOWEL OR 17X24 6PK STRL BLUE (TOWEL DISPOSABLE) ×3 IMPLANT
TRAY FOLEY BAG SILVER LF 14FR (SET/KITS/TRAYS/PACK) ×3 IMPLANT

## 2017-04-21 NOTE — Anesthesia Procedure Notes (Addendum)
Epidural Patient location during procedure: OR Start time: 04/21/2017 2:39 PM  Staffing Anesthesiologist: Josephine Igo Performed: anesthesiologist   Preanesthetic Checklist Completed: patient identified, site marked, surgical consent, pre-op evaluation, timeout performed, IV checked, risks and benefits discussed and monitors and equipment checked  Epidural Patient position: sitting Prep: site prepped and draped and DuraPrep Patient monitoring: continuous pulse ox and blood pressure Approach: midline Location: L3-L4 Injection technique: LOR air  Needle:  Needle type: Tuohy  Needle gauge: 17 G Needle length: 9 cm and 9 Needle insertion depth: 6 cm Catheter type: closed end flexible Catheter size: 19 Gauge Catheter at skin depth: 10 cm Test dose: negative and 2% lidocaine with Epi 1:200 K  Assessment Sensory level: T4 Events: blood not aspirated, injection not painful, no injection resistance, negative IV test and no paresthesia  Additional Notes Patient identified. Risks and benefits discussed including failed block, incomplete  Pain control, post dural puncture headache, nerve damage, paralysis, blood pressure Changes, nausea, vomiting, reactions to medications-both toxic and allergic and post Partum back pain. All questions were answered. Patient expressed understanding and wished to proceed. Sterile technique was used throughout procedure. Epidural site was Dressed with sterile barrier dressing. No paresthesias, signs of intravascular injection Or signs of intrathecal spread were encountered.  Patient was more comfortable after the epidural was dosed. Please see RN's note for documentation of FHR which were stable.

## 2017-04-21 NOTE — Anesthesia Postprocedure Evaluation (Signed)
Anesthesia Post Note  Patient: Carolyn Blake  Procedure(s) Performed: Procedure(s) (LRB): CESAREAN SECTION (N/A)  Patient location during evaluation: PACU Anesthesia Type: Epidural Level of consciousness: oriented and awake and alert Pain management: pain level controlled Vital Signs Assessment: post-procedure vital signs reviewed and stable Respiratory status: spontaneous breathing, respiratory function stable, patient connected to nasal cannula oxygen and nonlabored ventilation Cardiovascular status: blood pressure returned to baseline and stable Postop Assessment: no headache, no backache, epidural receding, patient able to bend at knees and no signs of nausea or vomiting Anesthetic complications: no        Last Vitals:  Vitals:   04/21/17 1612 04/21/17 1613  BP:    Pulse: 70 66  Resp: 17 18  Temp:      Last Pain:  Vitals:   04/21/17 0830  TempSrc: Oral   Pain Goal:                 [name] A.

## 2017-04-21 NOTE — Lactation Note (Signed)
This note was copied from a baby's chart. Lactation Consultation Note  Patient Name: Carolyn Blake RAXEN'M Date: 04/21/2017 Reason for consult: Follow-up assessment  Baby 18 hours old. Reviewed medication information with mom, given copy of information regarding "Lialda" and enc to speak with pediatrician before using. Assisted mom with positioning and latching baby in football hold at right breast. Mom not assisting with latch, so asked if she was uncomfortable and she said she was fine. Baby sleepy at breast and not willing to suckle this LC's gloved finger, so enc mom to keep baby STS. However, mom asked this LC to wrap the baby and give to her father to hold--and this was done. Patient's bedside nurse in the room during the consultation and aware of this LC's assessment and interventions.   Maternal Data Has patient been taught Hand Expression?: Yes Does the patient have breastfeeding experience prior to this delivery?: No  Feeding Feeding Type: Breast Fed Length of feed: 0 min  LATCH Score/Interventions Latch: Too sleepy or reluctant, no latch achieved, no sucking elicited. Intervention(s): Skin to skin;Waking techniques  Audible Swallowing: None Intervention(s): Skin to skin;Hand expression  Type of Nipple: Everted at rest and after stimulation  Comfort (Breast/Nipple): Soft / non-tender     Hold (Positioning): Assistance needed to correctly position infant at breast and maintain latch. Intervention(s): Breastfeeding basics reviewed;Support Pillows;Position options;Skin to skin  LATCH Score: 5  Lactation Tools Discussed/Used     Consult Status Consult Status: Follow-up Date: 04/22/17 Follow-up type: In-patient    Andres Labrum 04/21/2017, 9:38 PM

## 2017-04-21 NOTE — Anesthesia Preprocedure Evaluation (Signed)
Anesthesia Evaluation  Patient identified by MRN, date of birth, ID band Patient awake    Reviewed: Allergy & Precautions, NPO status , Patient's Chart, lab work & pertinent test results  Airway Mallampati: II  TM Distance: >3 FB Neck ROM: Full    Dental no notable dental hx. (+) Teeth Intact   Pulmonary neg pulmonary ROS,    Pulmonary exam normal breath sounds clear to auscultation       Cardiovascular negative cardio ROS Normal cardiovascular exam Rhythm:Regular Rate:Normal     Neuro/Psych  Headaches, negative psych ROS   GI/Hepatic Neg liver ROS, PUD, Hx/o Ulcerative colitis   Endo/Other  Obesity  Renal/GU negative Renal ROS  negative genitourinary   Musculoskeletal   Abdominal (+) + obese,   Peds  Hematology  (+) Sickle cell trait and anemia ,   Anesthesia Other Findings   Reproductive/Obstetrics (+) Pregnancy Fetal intolerance to labor                             Anesthesia Physical Anesthesia Plan  ASA: II and emergent  Anesthesia Plan: Epidural   Post-op Pain Management:    Induction:   Airway Management Planned: Natural Airway  Additional Equipment:   Intra-op Plan:   Post-operative Plan:   Informed Consent: I have reviewed the patients History and Physical, chart, labs and discussed the procedure including the risks, benefits and alternatives for the proposed anesthesia with the patient or authorized representative who has indicated his/her understanding and acceptance.     Plan Discussed with: CRNA, Anesthesiologist and Surgeon  Anesthesia Plan Comments:         Anesthesia Quick Evaluation

## 2017-04-21 NOTE — Anesthesia Pain Management Evaluation Note (Signed)
  CRNA Pain Management Visit Note  Patient: Carolyn Blake, 27 y.o., female  "Hello I am a member of the anesthesia team at [facility]. We have an anesthesia team available at all times to provide care throughout the hospital, including epidural management and anesthesia for C-section. I don't know your plan for the delivery whether it a natural birth, water birth, IV sedation, nitrous supplementation, doula or epidural, but we want to meet your pain goals."   1.Was your pain managed to your expectations on prior hospitalizations?   No prior hospitalizations  2.What is your expectation for pain management during this hospitalization?     Epidural  3.How can we help you reach that goal? epidural  Record the patient's initial score and the patient's pain goal.   Pain: 0  Pain Goal: 4 [facility] wants you to be able to say your pain was always managed very well.  Willa Rough 04/21/2017

## 2017-04-21 NOTE — H&P (Signed)
Carolyn Blake is a 27 y.o. female G1P0 at 81 weeks presenting for IOL for postdates.  Patient complaining of feeling "tingling" in vagina at the beginning of the week.  OB History    Gravida Para Term Preterm AB Living   1 0 0 0 0 0   SAB TAB Ectopic Multiple Live Births   0 0 0 0 0     Past Medical History:  Diagnosis Date  . Anemia   . Headache   . HSV-2 (herpes simplex virus 2) infection   . Irregular heart beat   . Keratoconus   . Ulcerative colitis [facility])    Past Surgical History:  Procedure Laterality Date  . NO PAST SURGERIES     Family History: family history includes Diabetes in her father; Heart disease in her father. Social History:  reports that she has never smoked. She has never used smokeless tobacco. She reports that she does not drink alcohol or use drugs.     Maternal Diabetes: No Genetic Screening: Normal Maternal Ultrasounds/Referrals: Normal Fetal Ultrasounds or other Referrals:  None Maternal Substance Abuse:  No Significant Maternal Medications:  None Significant Maternal Lab Results:  None Other Comments:  None  Review of Systems  Constitutional: Negative.   HENT: Negative.   Eyes: Negative.   Respiratory: Negative.   Cardiovascular: Negative.   Gastrointestinal: Negative.   Genitourinary: Negative.   Musculoskeletal: Negative.   Skin: Negative.   Neurological: Negative.   Endo/Heme/Allergies: Negative.   Psychiatric/Behavioral: Negative.    Maternal Medical History:  Reason for admission: IOL for postdates  Contractions: Frequency: rare.   Perceived severity is mild.    Fetal activity: Perceived fetal activity is normal.   Last perceived fetal movement was within the past hour.    Prenatal complications: no prenatal complications Prenatal Complications - Diabetes: none.    Dilation: Fingertip Effacement (%): 50 Station: -3 Exam by:: J.Cox, RN Blood pressure 138/69, pulse 73, temperature 98.4 F (36.9 C), temperature  source Oral, height 5' 4"  (1.626 m), weight 194 lb (88 kg), last menstrual period 06/24/2016. Maternal Exam:  Uterine Assessment: Contraction strength is mild.  Contraction frequency is rare.   Abdomen: Patient reports no abdominal tenderness. Estimated fetal weight is 7lbs.   Fetal presentation: vertex  Introitus: Normal vulva. Normal vagina.  Ferning test: not done.  Nitrazine test: not done. Amniotic fluid character: not assessed.  Pelvis: adequate for delivery.   Cervix: Cervix evaluated by digital exam.     Fetal Exam Fetal Monitor Review: Mode: ultrasound.   Baseline rate: 130.  Variability: moderate (6-25 bpm).   Pattern: accelerations present and no decelerations.    Fetal State Assessment: Category I - tracings are normal.     Physical Exam  Constitutional: She is oriented to person, place, and time. She appears well-developed and well-nourished.  HENT:  Head: Normocephalic.  Neck: Normal range of motion.  Cardiovascular: Normal rate, regular rhythm and normal heart sounds.   Respiratory: Effort normal.  GI: Soft.  Genitourinary: Vagina normal.  Musculoskeletal: Normal range of motion.  Neurological: She is alert and oriented to person, place, and time.  Skin: Skin is warm and dry.  Psychiatric: She has a normal mood and affect. Her behavior is normal. Judgment and thought content normal.    Prenatal labs: ABO, Rh: O/Positive/-- (09/18 0000) Antibody: Negative (09/18 0000) Rubella: Immune (09/18 0000) RPR: NON REAC (01/25 1615)  HBsAg: NEGATIVE (01/25 1539)  HIV: NONREACTIVE (01/25 1615)  GBS: Positive (03/15 0817)  Assessment/Plan: Carolyn Blake is a 27 y.o. female G1P0 at 43 weeks presenting for IOL for postdates. GBS+  Visual inspection of perineum. No noted lesions.  SVE: 0.5/thick/-3  Plan: cytotec induction of labor. GBS prophylaxis.    Carolyn Blake 04/21/2017, 9:48 AM

## 2017-04-21 NOTE — Transfer of Care (Signed)
Immediate Anesthesia Transfer of Care Note  Patient: Carolyn Blake  Procedure(s) Performed: Procedure(s): CESAREAN SECTION (N/A)  Patient Location: PACU  Anesthesia Type:Epidural  Level of Consciousness: awake and alert   Airway & Oxygen Therapy: Patient Spontanous Breathing  Post-op Assessment: Report given to RN and Post -op Vital signs reviewed and stable  Post vital signs: Reviewed  Last Vitals:  Vitals:   04/21/17 1052 04/21/17 1303  BP: 125/65 (!) 118/51  Pulse: 73 66  Temp:      Last Pain:  Vitals:   04/21/17 0830  TempSrc: Oral         Complications: No apparent anesthesia complications

## 2017-04-21 NOTE — Progress Notes (Signed)
Faculty Practice OB/GYN Attending Note  Subjective:  Called to evaluate patient with 3rd episode of prolonged deceleration since she started IOL this morning with misoprostol. She is due for second dose but the last deceleration just occurred.  Currently, FHR is reassuring.  Rare contractions, no LOF or vaginal bleeding. Good FM.   Objective:  Blood pressure (!) 118/51, pulse 66, temperature 98.4 F (36.9 C), temperature source Oral, height 5' 4"  (1.626 m), weight 194 lb (88 kg), last menstrual period 06/24/2016. FHT  Baseline 140 bpm, moderate variability, +accelerations, prolonged deceleration for 4 minutes. Toco:every 2-4 min Gen: NAD HENT: Normocephalic, atraumatic Lungs: Normal respiratory effort Heart: Regular rate noted Abdomen: NT, gravid fundus, soft Cervix: Deferred for now, was FT 4 hours earlier. Ext: 2+ DTRs, no edema, no cyanosis, negative Homan's sign  Assessment & Plan:  27 y.o. G1P0000 at 64w0dadmitted for IOL for postdates with recurrent prolonged FHR decelerations after misoprostol administration.  Concerned about the fetal tolerance of labor. Discussed concerns with patient and her family; cesarean delivery recommended.  They agreed with this recommendation. The risks of cesarean section discussed with the patient included but were not limited to: bleeding which may require transfusion or reoperation; infection which may require antibiotics; injury to bowel, bladder, ureters or other surrounding organs; injury to the fetus; need for additional procedures including hysterectomy in the event of a life-threatening hemorrhage; placental abnormalities wth subsequent pregnancies, incisional problems, thromboembolic phenomenon and other postoperative/anesthesia complications. The patient concurred with the proposed plan, giving informed written consent for the procedure.   Anesthesia and OR aware. Preoperative prophylactic antibiotics and SCDs ordered on call to the OR.  To OR when  ready.   UVerita Schneiders MD, FSan BernardinoAttending [place] [facility]

## 2017-04-21 NOTE — Lactation Note (Signed)
This note was copied from a baby's chart. Lactation Consultation Note  Patient Name: Carolyn Blake [id number] Date: 04/21/2017 Reason for consult: Initial assessment  Baby 2 hours old. Called by CN nurse, Caryl Pina, RN to assist mom with latching baby. After offering to assist mom in her [place] room, mom declined stating that she had visitors and would like for [place] to return later. LC's number placed on the erase board and mom enc to call for LC. Mom requested information regarding the compatibility of 2 medications she had in her purse and breastfeeding. Mom states that she is not currently taking these medications, but would like to have the information for future reference.   First medication in question is 'Lialda' Mesclamine, delayed release, 1.2 g/tablet, dose: 4 tablets daily, which according to "Medications and Mothers' Milk," by Rodell Perna is an L3 "Limited Data - Probably Compatible." However, pediatric concerns for this medication are multiple reports of bloody stools and diarrhea with similar medications. Will give MOB print out for this medication and enc to discuss with baby's pediatrician before resuming use.   Second medication in question is 'Valacyclovir HCL,' 500 mg dose, 2 times/day. This medication is an L2 "Limited Data - Probably Compatible" with no pediatric concerns according to "Medications and Mothers' Milk," by Rodell Perna. However, Walker Kehr reports that this medication may alter the taste of breast milk. Will share this information with mom as well.    Maternal Data    Feeding Feeding Type: Breast Fed  LATCH Score/Interventions Latch: Too sleepy or reluctant, no latch achieved, no sucking elicited. (a few sucks )                    Lactation Tools Discussed/Used     Consult Status Consult Status: Follow-up Date: 04/21/17 Follow-up type: In-patient    Andres Labrum 04/21/2017, 6:20 PM

## 2017-04-21 NOTE — Op Note (Signed)
[name] PROCEDURE DATE: 04/21/2017  PREOPERATIVE DIAGNOSES: Intrauterine pregnancy at 57w0dweeks gestation; non-reassuring fetal status and fetal intolerance of labor  POSTOPERATIVE DIAGNOSES: The same  PROCEDURE: Primary Low Transverse Cesarean Section  SURGEON:  Dr. UVerita Schneiders ASSISTANT:  HDuffy Rhody RNFA  ANESTHESIOLOGIST: Dr. MJosephine Igo INDICATIONS: Carolyn Blake a 27y.o. G1P0000 at 487w0dere for cesarean section secondary to the indications listed under preoperative diagnoses; please see preoperative note for further details.  The risks of cesarean section were discussed with the patient including but were not limited to: bleeding which may require transfusion or reoperation; infection which may require antibiotics; injury to bowel, bladder, ureters or other surrounding organs; injury to the fetus; need for additional procedures including hysterectomy in the event of a life-threatening hemorrhage; placental abnormalities wth subsequent pregnancies, incisional problems, thromboembolic phenomenon and other postoperative/anesthesia complications.   The patient concurred with the proposed plan, giving informed written consent for the procedure.    FINDINGS:  Viable female infant in cephalic presentation.  Apgars 8 and 9.  Heavy meconium in amniotic fluid.  Intact placenta, three vessel cord.  Normal uterus, fallopian tubes and ovaries bilaterally.  ANESTHESIA: Epidural INTRAVENOUS FLUIDS: 1700 ml ESTIMATED BLOOD LOSS: 500 ml URINE OUTPUT:  300 ml SPECIMENS: Placenta sent to pathology COMPLICATIONS: None immediate  PROCEDURE IN DETAIL:  The patient preoperatively received intravenous antibiotics and had sequential compression devices applied to her lower extremities.  She was then taken to the operating room where epidural anesthesia was administered and was found to be adequate. She was then placed in a dorsal supine position with a leftward tilt, and  prepped and draped in a sterile manner.  A foley catheter was placed into her bladder and attached to constant gravity.  After an adequate timeout was performed, a Pfannenstiel skin incision was made with scalpel and carried through to the underlying layer of fascia. The fascia was incised in the midline, and this incision was extended bilaterally using the Mayo scissors.  Kocher clamps were applied to the superior aspect of the fascial incision and the underlying rectus muscles were dissected off bluntly.  A similar process was carried out on the inferior aspect of the fascial incision. The rectus muscles were separated in the midline bluntly and the peritoneum was entered bluntly. Attention was turned to the lower uterine segment where a low transverse hysterotomy was made with a scalpel and extended bilaterally bluntly.  The infant was successfully delivered, the cord was clamped and cut after one minute, and the infant was handed over to the awaiting neonatology team. Uterine massage was then administered, and the placenta delivered intact with a three-vessel cord. The uterus was then cleared of clots and debris.  The hysterotomy was closed with 0 Vicryl in a running locked fashion, and an imbricating layer was also placed with 0 Vicryl.  Figure-of-eight 0 Vicryl serosal stitches were placed to help with hemostasis.  The pelvis was cleared of all clot and debris. Hemostasis was confirmed on all surfaces.  The peritoneum was closed with a 0 Vicryl running stitch. The fascia was then closed using 0 Vicryl in a running fashion.  The subcutaneous layer was irrigated, and 30 ml of 0.5% Marcaine was injected subcutaneously around the incision.  The skin was closed with a 4-0 Vicryl subcuticular stitch. The patient tolerated the procedure well. Sponge, lap, instrument and needle counts were correct x 3.  She was taken to the recovery room in stable condition.  Verita Schneiders, MD, [place] Attending [place], [facility]

## 2017-04-22 ENCOUNTER — Encounter (HOSPITAL_COMMUNITY): Payer: Self-pay | Admitting: Obstetrics & Gynecology

## 2017-04-22 LAB — CBC
HCT: 29.5 % — ABNORMAL LOW (ref 36.0–46.0)
Hemoglobin: 10.3 g/dL — ABNORMAL LOW (ref 12.0–15.0)
MCH: 30.4 pg (ref 26.0–34.0)
MCHC: 34.9 g/dL (ref 30.0–36.0)
MCV: 87 fL (ref 78.0–100.0)
Platelets: 180 10*3/uL (ref 150–400)
RBC: 3.39 MIL/uL — ABNORMAL LOW (ref 3.87–5.11)
RDW: 13.4 % (ref 11.5–15.5)
WBC: 14 10*3/uL — ABNORMAL HIGH (ref 4.0–10.5)

## 2017-04-22 NOTE — Lactation Note (Signed)
This note was copied from a baby's chart. Lactation Consultation Note  Baby 56 hours old and sleepy.  Undressed baby for feeding. Reviewed hand expression with mother.  Glistening expressed. Noted that baby seemed spitty. Attempted latching baby in football hold but baby did not wake for feeding. Placed baby STS on mother's chest.  Suggest leaving him STS until he shows feeding cues, then attempt latch. Mother will call for further assistance.  Patient Name: Carolyn Blake [id number] Date: 04/22/2017 Reason for consult: Follow-up assessment   Maternal Data Has patient been taught Hand Expression?: Yes  Feeding Feeding Type: Breast Fed (encouraged to try feeding, stated would attempt again at 10 )  [facility] Score/Interventions                      Lactation Tools Discussed/Used     Consult Status Consult Status: Follow-up Date: 04/23/17 Follow-up type: In-patient    Vivianne Master [facility] 04/22/2017, 11:45 AM

## 2017-04-22 NOTE — Plan of Care (Signed)
Problem: Coping: Goal: Ability to cope will improve Outcome: Progressing Mother having social concerns with FOB. Security aware and FOB has not been here today. Patient has had family support all day and patient coping well. Medical Social Worker in to see patient.

## 2017-04-22 NOTE — Progress Notes (Signed)
Patient reports last newborn feeding was at 0400 this am. Pt states attempted to feed at 0810 and 0930 with no success. Stated "he just wouldn't latch". Discussed importance of feeding newborns every 2-3 hours and waking him up if sleeping when time for next feeding to reduce risk of low blood sugars. Pt verbalized understanding. Assisted her to try and feed at 1000, baby unable to latch successfully. Notified Rise Paganini, RN and Rod Holler with Lactation. Stated she will see patient. Morganna Styles M

## 2017-04-22 NOTE — Addendum Note (Signed)
Addendum  created 04/22/17 4128 by Georgeanne Nim, CRNA   Sign clinical note

## 2017-04-22 NOTE — Anesthesia Postprocedure Evaluation (Signed)
Anesthesia Post Note  Patient: Carolyn Blake  Procedure(s) Performed: Procedure(s) (LRB): CESAREAN SECTION (N/A)  Patient location during evaluation: Mother Baby Anesthesia Type: Epidural Level of consciousness: awake Pain management: pain level controlled Vital Signs Assessment: post-procedure vital signs reviewed and stable Respiratory status: spontaneous breathing Cardiovascular status: stable Postop Assessment: no headache, no backache, epidural receding, patient able to bend at knees and no signs of nausea or vomiting Anesthetic complications: no        Last Vitals:  Vitals:   04/22/17 0230 04/22/17 0500  BP: (!) 126/57 114/60  Pulse: 68 65  Resp: 18 18  Temp: 36.9 C 36.8 C    Last Pain:  Vitals:   04/22/17 0626  TempSrc:   PainSc: 0-No pain   Pain Goal:                 Everette Rank

## 2017-04-22 NOTE — Progress Notes (Signed)
POSTPARTUM PROGRESS NOTE  Post Operative Day #1 Subjective:  Carolyn Blake is a 27 y.o. G1P1001 58w0ds/p PLTCS.  No acute events overnight.  Pt denies problems with ambulating, voiding or po intake.  She denies nausea or vomiting.  Pain is well controlled.  She has had flatus. She has not had bowel movement.  Lochia Minimal.   Objective: Blood pressure 114/60, pulse 65, temperature 98.3 F (36.8 C), temperature source Oral, resp. rate 18, height 5' 4"  (1.626 m), weight 194 lb (88 kg), last menstrual period 06/24/2016, SpO2 97 %, unknown if currently breastfeeding.  Physical Exam:  General: alert, cooperative and no distress Lochia:normal flow Chest: no respiratory distress Heart:regular rate, distal pulses intact Incision: No drainage or fluid leakage Abdomen: soft, nontender,  Uterine Fundus: firm, appropriately tender DVT Evaluation: No calf swelling or tenderness Extremities: minimal edema   Recent Labs  04/21/17 0842 04/22/17 0518  HGB 11.9* 10.3*  HCT 35.0* 29.5*    Assessment/Plan:  ASSESSMENT: Carolyn Shannahanis a 27y.o. G1P1001 461w0d/p cesarean section. Patient is doing well, pain is well controlled, patient has not had much appetite but has been drinking plenty of fluid. Patient passing flatus but denies any BM.   Plan for discharge tomorrow and Breastfeeding   LOS: 1 day   AbMarjie SkiffMD 04/22/2017, 8:01 AM   OB FELLOW POSTPARTUM PROGRESS NOTE ATTESTATION  I confirm that I have verified the information documented in the resident's note and that I have also personally reperformed the physical exam and all medical decision making activities.     NiJacquiline DoeMD 9:31 AM

## 2017-04-22 NOTE — Progress Notes (Signed)
CSW acknowledged consult and attempted to meet with MOB.  MOB reported MOB's and FOB's social issues have been resolved and MOB was not open to the discussing the issues. CSW provided MOB with CSW contact information if CSW wanted to speak with CSW in the future.    Please contact the Clinical Social Worker if needs arise, or if MOB requests.   There are no barriers to d/c.  Laurey Arrow, MSW, LCSW Clinical Social Work [phone]

## 2017-04-23 NOTE — Lactation Note (Signed)
This note was copied from a baby's chart. Lactation Consultation Note  Baby 25 hours old.  37 weeks and has been sleepy and not regularly feeding. Noted tongue not extended under nipple. Discussed w/ mother the importance of feeding the baby q 3 hours or sooner with feeding cues. Assisted RN with latching baby.  He comes off and on nipple. LC applied #20 NS and baby was able sustain latch for approx 10 min. Mother requested formula during consult. Prefilled NS with formula and baby sustained latch for a few more minutes. Applied 5 Fr feeding tube w/ formula under NS and took 10 ml. Mother stated she would prefer to give baby a bottle of formula instead of using 5 french feeding tube. Provided mother with LPI volume guidelines due to infant's weight < 6 lbs. Referered cleaning and milk storage.   Mom shown how to use DEBP & how to disassemble, clean, & reassemble parts. Suggest mother post pump 4-6 times a day for 10-15 min and give volume back to baby. Reviewed volume guidelines.        Patient Name: Boy Sherrian Nunnelley [id number] Date: 04/23/2017 Reason for consult: Infant < 6lbs;Follow-up assessment   Maternal Data    Feeding Feeding Type: Breast Fed Length of feed: 5 min  LATCH Score/Interventions Latch: Repeated attempts needed to sustain latch, nipple held in mouth throughout feeding, stimulation needed to elicit sucking reflex.  Audible Swallowing: A few with stimulation  Type of Nipple: Everted at rest and after stimulation  Comfort (Breast/Nipple): Soft / non-tender (semi flat)     Hold (Positioning): Assistance needed to correctly position infant at breast and maintain latch.  LATCH Score: 7  Lactation Tools Discussed/Used Tools: Nipple Shields Nipple shield size: 20 Pump Review: Setup, frequency, and cleaning;Milk Storage Initiated by:: Vivianne Master RN IBCLC Date initiated:: 04/23/17   Consult Status Consult Status: Follow-up Date:  04/23/17 Follow-up type: In-patient    Vivianne Master [facility] 04/23/2017, 10:06 AM

## 2017-04-23 NOTE — Lactation Note (Signed)
This note was copied from a baby's chart. Lactation Consultation Note  Discussed breastfeeding first with #20NS then supplementing with either breastmilk or formula. Reviewed volume guidelines, provided mother w/ hand pump and gave her another #20NS. Mother states she has personal DEBP at home. Reviewed engorgement care and monitoring voids/stools. Unsure if mother will continue breastfeeding (difficulty latching) or change to only formula feeding. Offered assistance and mother should call if needed.   Patient Name: Carolyn Blake [id number] Date: 04/23/2017 Reason for consult: Infant < 6lbs;Follow-up assessment   Maternal Data    Feeding Feeding Type: Breast Fed  LATCH Score/Interventions Latch: Repeated attempts needed to sustain latch, nipple held in mouth throughout feeding, stimulation needed to elicit sucking reflex.  Audible Swallowing: A few with stimulation  Type of Nipple: Everted at rest and after stimulation  Comfort (Breast/Nipple): Soft / non-tender (semi flat)     Hold (Positioning): Assistance needed to correctly position infant at breast and maintain latch.  LATCH Score: 7  Lactation Tools Discussed/Used Tools: Nipple Shields Nipple shield size: 20 Pump Review: Setup, frequency, and cleaning;Milk Storage Initiated by:: Vivianne Master RN IBCLC Date initiated:: 04/23/17   Consult Status Consult Status: Follow-up Date: 04/23/17 Follow-up type: In-patient    Vivianne Master [facility] 04/23/2017, 12:03 PM

## 2017-04-23 NOTE — Discharge Summary (Signed)
OB Discharge Summary     Patient Name: Carolyn Blake DOB: 05/01/1990 MRN: [id number]  Date of admission: 04/21/2017 Delivering MD: Verita Schneiders A   Date of discharge: 04/23/2017  Admitting diagnosis: 28WKS INDUCTION  Intrauterine pregnancy: [redacted]w[redacted]d    Secondary diagnosis:  Active Problems:   Post-dates pregnancy  Additional problems: None     Discharge diagnosis: Term Pregnancy Delivered                                                                                                Post partum procedures:None  Augmentation: Cytotec  Complications: None  Hospital course:  Induction of Labor With Cesarean Section  27y.o. yo G1P1001 at 445w0das admitted to the hospital 04/21/2017 for induction of labor. Patient had a labor course significant for decelerations after misoprostil. The patient went for cesarean section due to Non-Reassuring FHR, and delivered a Viable infant, Membrane Rupture Time/Date: )1:07 PM ,04/21/2017 . Details of operation can be found in separate operative Note.  Patient had an uncomplicated postpartum course. She is ambulating, tolerating a regular diet, passing flatus, and urinating well.  Patient is discharged home in stable condition on 04/23/17.                                    Physical exam  Vitals:   04/22/17 0847 04/22/17 1315 04/22/17 1755 04/23/17 0559  BP: 120/70 (!) 116/54 119/65 133/61  Pulse: 73 69 72 70  Resp:  16 16 18   Temp:  98 F (36.7 C) 98.4 F (36.9 C) 98 F (36.7 C)  TempSrc:  Oral Oral Oral  SpO2: 100% 100% 100%   Weight:      Height:       General: alert, cooperative and no distress Lochia: appropriate Uterine Fundus: firm Incision: Healing well with no significant drainage DVT Evaluation: No evidence of DVT seen on physical exam. Labs: Lab Results  Component Value Date   WBC 14.0 (H) 04/22/2017   HGB 10.3 (L) 04/22/2017   HCT 29.5 (L) 04/22/2017   MCV 87.0 04/22/2017   PLT 180 04/22/2017   CMP Latest Ref Rng &  Units 12/31/2016  Glucose 65 - 99 mg/dL 92  BUN 6 - 20 mg/dL <5(L)  Creatinine 0.44 - 1.00 mg/dL 0.54  Sodium 135 - 145 mmol/L 135  Potassium 3.5 - 5.1 mmol/L 3.4(L)  Chloride 101 - 111 mmol/L 104  CO2 22 - 32 mmol/L 21(L)  Calcium 8.9 - 10.3 mg/dL 8.1(L)  Total Protein 6.5 - 8.1 g/dL -  Total Bilirubin 0.3 - 1.2 mg/dL -  Alkaline Phos 38 - 126 U/L -  AST 15 - 41 U/L -  ALT 14 - 54 U/L -    Discharge instruction: per After Visit Summary and "Baby and Me Booklet".  After visit meds:  Allergies as of 04/23/2017   No Known Allergies     Medication List    STOP taking these medications   prenatal multivitamin Tabs tablet   valACYclovir 500 MG tablet Commonly  known as:  VALTREX       Diet: routine diet  Activity: Advance as tolerated. Pelvic rest for 6 weeks.   Outpatient follow up:6 weeks Follow up Appt:Future Appointments Date Time Provider [place]  05/28/2017 8:40 AM Manya Silvas, CNM [place]   Follow up Visit:No Follow-up on file.  Postpartum contraception: IUD unclear  Newborn Data: Live born female  Birth Weight: 6 lb 4.4 oz (2846 g) APGAR: 8, 9  Baby Feeding: Breast Disposition:home with mother   04/23/2017 Marjie Skiff, MD  Midwife attestation I have seen and examined this patient and agree with above documentation in the resident's note.   Carolyn Blake is a 27 y.o. G1P1001 s/p CS.   Pain is well controlled.  Plan for birth control is IUD.  Method of Feeding: both  PE:  Gen: well appearing Heart: reg rate Lungs: normal WOB Fundus firm Ext: soft, no pain, no edema   Recent Labs  04/21/17 0842 04/22/17 0518  HGB 11.9* 10.3*  HCT 35.0* 29.5*     Assessment - discharge today  Plan: - postpartum care discussed - f/u clinic in 6 weeks for postpartum visit   Julianne Handler, CNM 8:48 AM

## 2017-04-23 NOTE — Discharge Instructions (Signed)

## 2017-04-25 LAB — TYPE AND SCREEN
ABO/RH(D): O POS
Antibody Screen: POSITIVE
DAT, IgG: POSITIVE
Unit division: 0
Unit division: 0

## 2017-04-25 LAB — BPAM RBC
Blood Product Expiration Date: 201805102359
Blood Product Expiration Date: 201805102359
Unit Type and Rh: 5100
Unit Type and Rh: 5100

## 2017-05-16 ENCOUNTER — Telehealth: Payer: Self-pay | Admitting: *Deleted

## 2017-05-16 NOTE — Telephone Encounter (Signed)
Carolyn Blake called this am and left a message that she was calling to see if her c/s is infected- states it is draining some.

## 2017-05-16 NOTE — Telephone Encounter (Signed)
I called Carolyn Blake's number back twice, both times phone rang but then no sound. Did not hear answering machine pick up , but attempted to leave a message we are calling you back, please call our office again.

## 2017-05-17 ENCOUNTER — Encounter (HOSPITAL_COMMUNITY): Payer: Self-pay | Admitting: *Deleted

## 2017-05-17 ENCOUNTER — Inpatient Hospital Stay (HOSPITAL_COMMUNITY)
Admission: AD | Admit: 2017-05-17 | Discharge: 2017-05-17 | Disposition: A | Discharge status: Home / Self Care | Payer: Medicaid Other | Source: Ambulatory Visit | Attending: Obstetrics & Gynecology | Admitting: Obstetrics & Gynecology

## 2017-05-17 DIAGNOSIS — T8130XA Disruption of wound, unspecified, initial encounter: Secondary | ICD-10-CM

## 2017-05-17 DIAGNOSIS — T8131XA Disruption of external operation (surgical) wound, not elsewhere classified, initial encounter: Secondary | ICD-10-CM | POA: Insufficient documentation

## 2017-05-17 DIAGNOSIS — Z9889 Other specified postprocedural states: Secondary | ICD-10-CM

## 2017-05-17 DIAGNOSIS — Y839 Surgical procedure, unspecified as the cause of abnormal reaction of the patient, or of later complication, without mention of misadventure at the time of the procedure: Secondary | ICD-10-CM | POA: Diagnosis not present

## 2017-05-17 LAB — URINALYSIS, ROUTINE W REFLEX MICROSCOPIC
Bilirubin Urine: NEGATIVE
Glucose, UA: NEGATIVE mg/dL
Hgb urine dipstick: NEGATIVE
Ketones, ur: NEGATIVE mg/dL
Leukocytes, UA: NEGATIVE
Nitrite: NEGATIVE
Protein, ur: NEGATIVE mg/dL
Specific Gravity, Urine: 1.015 (ref 1.005–1.030)
pH: 5.5 (ref 5.0–8.0)

## 2017-05-17 LAB — POCT PREGNANCY, URINE: Preg Test, Ur: NEGATIVE

## 2017-05-17 NOTE — MAU Provider Note (Signed)
Chief Complaint:  incision infected (c section )   First Provider Initiated Contact with Patient 05/17/17 0940      HPI: Carolyn Blake is a 27 y.o. G1P1001 who presents to maternity admissions reporting pus coming from her incision. Noticed it this week. No pain or bleeding.  C/S was about a month ago. Home health RN took honeycomb off at 1 wk but told pt to leave steristrips on.. 6 of these remain.. She reports little vaginal bleeding, but no vaginal itching/burning, urinary symptoms, h/a, dizziness, n/v, or fever/chills.    Other  This is a new (incision draining pus) problem. The current episode started in the past 7 days. The problem occurs intermittently. The problem has been unchanged. Pertinent negatives include no abdominal pain, chills, fever, myalgias, nausea or vomiting. Nothing aggravates the symptoms. She has tried nothing for the symptoms.    RN Note: Patient had c section about a month ago and noticed some "pus" at the side of her incision.  Denies pain or fever.  No other complaints at this time.  Past Medical History: Past Medical History:  Diagnosis Date  . Anemia   . Headache   . HSV-2 (herpes simplex virus 2) infection   . Irregular heart beat   . Keratoconus   . Ulcerative colitis ([place])     Past obstetric history: OB History  Gravida Para Term Preterm AB Living  1 1 1  0 0 1  SAB TAB Ectopic Multiple Live Births  0 0 0 0 1    # Outcome Date GA Lbr Len/2nd Weight Sex Delivery Anes PTL Lv  1 Term 04/21/17 [redacted]w[redacted]d 6 lb 4.4 oz (2.846 kg) M CS-LTranv EPI  LIV      Past Surgical History: Past Surgical History:  Procedure Laterality Date  . CESAREAN SECTION N/A 04/21/2017   Procedure: CESAREAN SECTION;  Surgeon: UOsborne Oman MD;  [facility]  Service: Obstetrics;  Laterality: N/A;  . NO PAST SURGERIES      Family History: Family History  Problem Relation Age of Onset  . Heart disease Father   . Diabetes Father     Social  History: Social History  Substance Use Topics  . Smoking status: Never Smoker  . Smokeless tobacco: Never Used  . Alcohol use No    Allergies: No Known Allergies  Meds:  No prescriptions prior to admission.    I have reviewed patient's Past Medical Hx, Surgical Hx, Family Hx, Social Hx, medications and allergies.  ROS:  Review of Systems  Constitutional: Negative for chills and fever.  Gastrointestinal: Negative for abdominal pain, nausea and vomiting.  Genitourinary: Positive for vaginal bleeding.  Musculoskeletal: Negative for myalgias.   Other systems negative     Physical Exam  Patient Vitals for the past 24 hrs:  BP Temp Temp src Pulse Resp SpO2 Height Weight  05/17/17 0929 (!) 109/59 98.2 F (36.8 C) Oral 74 (!) 24 99 % - -  05/17/17 0927 - - - - - - 5' 4"  (1.626 m) 170 lb 12 oz (77.5 kg)   Constitutional: Well-developed, well-nourished female in no acute distress.  Cardiovascular: normal rate and rhythm, no ectopy audible, S1 & S2 heard, no murmur Respiratory: normal effort, no distress. Lungs CTAB with no wheezes or crackles GI: Abd soft, non-tender.  Nondistended.  No rebound, No guarding.  Bowel Sounds audible       Steristrips removed.  Incision intact.  Small superficial defect in outer layer of incision, 1cm.  Does not admit swab.  No pus seen.     MS: Extremities nontender, no edema, normal ROM Neurologic: Alert and oriented x 4.   Grossly nonfocal. GU: Neg CVAT. Skin:  Warm and Dry Psych:  Affect appropriate.  PELVIC EXAM: deferred   Labs: Results for orders placed or performed during the hospital encounter of 05/17/17 (from the past 24 hour(s))  Pregnancy, urine POC     Status: None   Collection Time: 05/17/17  9:31 AM  Result Value Ref Range   Preg Test, Ur NEGATIVE NEGATIVE   --/--/O POS (04/22 5749)  Imaging:  US Ob Limited  Result Date: 04/30/2017 Please refer to "Notes" to see consult details.   MAU Course/MDM: I have ordered labs  as follows: none Imaging ordered: none Treatments in MAU included Dressing (dry) applied.   Pt stable at time of discharge.  Assessment: Wound disruption, small  Plan: Discharge home Recommend keep area clean and dry Has postop appt in 1-2 wks. Will reevaluate then   Encouraged to return here or to other Urgent Care/ED if she develops worsening of symptoms, increase in pain, fever, or other concerning symptoms.   Hansel Feinstein CNM, MSN Certified Nurse-Midwife 05/17/2017 9:53 AM

## 2017-05-17 NOTE — MAU Note (Signed)
Patient had c section about a month ago and noticed some "pus" at the side of her incision.  Denies pain or fever.  No other complaints at this time.

## 2017-05-17 NOTE — Telephone Encounter (Signed)
Patient was seen in MAU today 5/18 for this problem.

## 2017-05-17 NOTE — Discharge Instructions (Signed)
Wound Dehiscence Wound dehiscence is when a cut from surgery (an incision) opens up and does not heal like it should. This problem usually happens 7-10 days after surgery. You may have bleeding from the cut. You may also have pain or a fever. This condition should be treated early. Follow these instructions at home: Medicines   Take over-the-counter and prescription medicines only as told by your doctor.  If you were prescribed an antibiotic medicine, take it as told by your doctor. Do not stop taking it even if you start to feel better.  Use medicines that help to stop itching as told by your doctor. The wound may feel itchy as it heals. Wound care   Follow instructions from your doctor about how to take care of your wound. Make sure you:  Wash your hands with soap and water before you change your bandage (dressing) or wash the wound area. If you cannot use soap and water, use hand sanitizer.  Wash your wound with mild soap and water 2 times a day, or as told. Rinse off the soap. Pat the area dry with a clean towel. Do not rub the wound.  Change bandages as told by your doctor.  Do not pick or scratch at the wound.  Check your wound area every day for signs of infection. Check for:  More redness, swelling, or pain.  More fluid or blood.  Warmth.  Pus or a bad smell. Activity   Avoid exercises that make you sweat or could stretch your wound.  Do not lift anything that is heavier than 10 lb (4.5 kg) until the wound is healed or until your doctor says that it is safe. General instructions   Do not take baths, swim, or use a hot tub until your doctor approves. You may take showers.  Keep all follow-up visits as told by your doctor. This is important. Contact a doctor if:  Your wound does not seem to be healing right.  You have a fever. Get help right away if:  You have more redness, swelling, or pain around your wound.  You have more fluid coming from your  wound.  Your wound feels warm to the touch.  You have pus or a bad smell coming from your wound.  More of the wound breaks open.  You have red streaks spreading from your wound.  You have a lot of bleeding from your wound. Summary  Wound dehiscence is when a cut from surgery (an incision) opens up and does not heal like it should.  Follow instructions from your doctor about how to take care of your wound.  Check your wound area every day for signs of infection. These signs can be redness, swelling, pain, fluid, blood, warmth, pus, or a bad smell.  If you were prescribed an antibiotic medicine, take it as told by your doctor. Do not stop taking it even if you start to feel better.  Contact a doctor if your wound does not seem to be healing right. This information is not intended to replace advice given to you by your health care provider. Make sure you discuss any questions you have with your health care provider. Document Released: 12/05/2009 Document Revised: 11/21/2016 Document Reviewed: 11/21/2016 Elsevier Interactive Patient Education  2017 [employer].

## 2017-05-28 ENCOUNTER — Ambulatory Visit (INDEPENDENT_AMBULATORY_CARE_PROVIDER_SITE_OTHER): Payer: Medicaid Other | Admitting: Advanced Practice Midwife

## 2017-05-28 ENCOUNTER — Encounter: Payer: Self-pay | Admitting: Advanced Practice Midwife

## 2017-05-28 VITALS — BP 131/82 | HR 71 | Wt 169.6 lb

## 2017-05-28 DIAGNOSIS — Z3202 Encounter for pregnancy test, result negative: Secondary | ICD-10-CM | POA: Diagnosis not present

## 2017-05-28 DIAGNOSIS — Z30013 Encounter for initial prescription of injectable contraceptive: Secondary | ICD-10-CM

## 2017-05-28 DIAGNOSIS — Z3042 Encounter for surveillance of injectable contraceptive: Secondary | ICD-10-CM

## 2017-05-28 LAB — POCT PREGNANCY, URINE: Preg Test, Ur: NEGATIVE

## 2017-05-28 MED ORDER — MEDROXYPROGESTERONE ACETATE 150 MG/ML IM SUSP
150.0000 mg | Freq: Once | INTRAMUSCULAR | Status: AC
  Administered 2017-05-28: 150 mg via INTRAMUSCULAR

## 2017-05-28 NOTE — Progress Notes (Signed)
Subjective:     Carolyn Blake is a 27 y.o. female who presents for a postpartum visit. She is 5 weeks postpartum following a low cervical transverse Cesarean section. I have fully reviewed the prenatal and intrapartum course. The delivery was at 76 gestational weeks. Outcome: primary cesarean section, low transverse incision. Anesthesia: epidural. Postpartum course has been unremarkable. Baby's course has been unremarkable. Baby is feeding by breast. Bleeding no bleeding. Bowel function is normal. Bladder function is normal. Patient is not sexually active. Contraception method is none. Postpartum depression screening: negative.  The following portions of the patient's history were reviewed and updated as appropriate: allergies, current medications, past family history, past medical history, past social history, past surgical history and problem list.  Review of Systems Pertinent items are noted in HPI.   Objective:   BP 131/82   Pulse 71   Wt 169 lb 9.6 oz (76.9 kg)   Breastfeeding? Yes   BMI 29.11 kg/m   General:  alert, cooperative, appears stated age and no distress   Breasts:  declined  Lungs: clear to auscultation bilaterally  Heart:  regular rate and rhythm, S1, S2 normal, no murmur, click, rub or gallop  Abdomen: soft, non-tender; bowel sounds normal; no masses,  no organomegaly and incision healing well   Vulva:  not evaluated  Vagina: not evaluated  Cervix:  Not evaluated  Corpus: not examined  Adnexa:  not evaluated  Rectal Exam: Not performed.        Assessment:     Nml postpartum exam. Pap smear not done at today's visit.   Plan:    1. Contraception: Depo-Provera injections 2. Considering IUD in 12 weeks. Doesn't want today. R/B/I discussed. 3. Follow up in: 12 weeks or as needed.

## 2017-05-28 NOTE — Patient Instructions (Addendum)
Medroxyprogesterone injection [Contraceptive] What is this medicine? MEDROXYPROGESTERONE (me DROX ee proe JES te rone) contraceptive injections prevent pregnancy. They provide effective birth control for 3 months. Depo-subQ Provera 104 is also used for treating pain related to endometriosis. This medicine may be used for other purposes; ask your health care provider or pharmacist if you have questions. COMMON BRAND NAME(S): Depo-Provera, Depo-subQ Provera 104 What should I tell my health care provider before I take this medicine? They need to know if you have any of these conditions: -frequently drink alcohol -asthma -blood vessel disease or a history of a blood clot in the lungs or legs -bone disease such as osteoporosis -breast cancer -diabetes -eating disorder (anorexia nervosa or bulimia) -high blood pressure -HIV infection or AIDS -kidney disease -liver disease -mental depression -migraine -seizures (convulsions) -stroke -tobacco smoker -vaginal bleeding -an unusual or allergic reaction to medroxyprogesterone, other hormones, medicines, foods, dyes, or preservatives -pregnant or trying to get pregnant -breast-feeding How should I use this medicine? Depo-Provera Contraceptive injection is given into a muscle. Depo-subQ Provera 104 injection is given under the skin. These injections are given by a health care professional. You must not be pregnant before getting an injection. The injection is usually given during the first 5 days after the start of a menstrual period or 6 weeks after delivery of a baby. Talk to your pediatrician regarding the use of this medicine in children. Special care may be needed. These injections have been used in female children who have started having menstrual periods. Overdosage: If you think you have taken too much of this medicine contact a poison control center or emergency room at once. NOTE: This medicine is only for you. Do not share this medicine  with others. What if I miss a dose? Try not to miss a dose. You must get an injection once every 3 months to maintain birth control. If you cannot keep an appointment, call and reschedule it. If you wait longer than 13 weeks between Depo-Provera contraceptive injections or longer than 14 weeks between Depo-subQ Provera 104 injections, you could get pregnant. Use another method for birth control if you miss your appointment. You may also need a pregnancy test before receiving another injection. What may interact with this medicine? Do not take this medicine with any of the following medications: -bosentan This medicine may also interact with the following medications: -aminoglutethimide -antibiotics or medicines for infections, especially rifampin, rifabutin, rifapentine, and griseofulvin -aprepitant -barbiturate medicines such as phenobarbital or primidone -bexarotene -carbamazepine -medicines for seizures like ethotoin, felbamate, oxcarbazepine, phenytoin, topiramate -modafinil -St. John's wort This list may not describe all possible interactions. Give your health care provider a list of all the medicines, herbs, non-prescription drugs, or dietary supplements you use. Also tell them if you smoke, drink alcohol, or use illegal drugs. Some items may interact with your medicine. What should I watch for while using this medicine? This drug does not protect you against HIV infection (AIDS) or other sexually transmitted diseases. Use of this product may cause you to lose calcium from your bones. Loss of calcium may cause weak bones (osteoporosis). Only use this product for more than 2 years if other forms of birth control are not right for you. The longer you use this product for birth control the more likely you will be at risk for weak bones. Ask your health care professional how you can keep strong bones. You may have a change in bleeding pattern or irregular periods. Many females stop having  periods while taking this drug. If you have received your injections on time, your chance of being pregnant is very low. If you think you may be pregnant, see your health care professional as soon as possible. Tell your health care professional if you want to get pregnant within the next year. The effect of this medicine may last a long time after you get your last injection. What side effects may I notice from receiving this medicine? Side effects that you should report to your doctor or health care professional as soon as possible: -allergic reactions like skin rash, itching or hives, swelling of the face, lips, or tongue -breast tenderness or discharge -breathing problems -changes in vision -depression -feeling faint or lightheaded, falls -fever -pain in the abdomen, chest, groin, or leg -problems with balance, talking, walking -unusually weak or tired -yellowing of the eyes or skin Side effects that usually do not require medical attention (report to your doctor or health care professional if they continue or are bothersome): -acne -fluid retention and swelling -headache -irregular periods, spotting, or absent periods -temporary pain, itching, or skin reaction at site where injected -weight gain This list may not describe all possible side effects. Call your doctor for medical advice about side effects. You may report side effects to FDA at 1-800-FDA-1088. Where should I keep my medicine? This does not apply. The injection will be given to you by a health care professional. NOTE: This sheet is a summary. It may not cover all possible information. If you have questions about this medicine, talk to your doctor, pharmacist, or health care provider.  2018 Elsevier/Gold Standard (2009-01-07 18:37:56)   Levonorgestrel intrauterine device (IUD) What is this medicine? LEVONORGESTREL IUD (LEE voe nor jes trel) is a contraceptive (birth control) device. The device is placed inside the  uterus by a healthcare professional. It is used to prevent pregnancy. This device can also be used to treat heavy bleeding that occurs during your period. This medicine may be used for other purposes; ask your health care provider or pharmacist if you have questions. COMMON BRAND NAME(S): Minette [place] What should I tell my health care provider before I take this medicine? They need to know if you have any of these conditions: -abnormal Pap smear -cancer of the breast, uterus, or cervix -diabetes -endometritis -genital or pelvic infection now or in the past -have more than one sexual partner or your partner has more than one partner -heart disease -history of an ectopic or tubal pregnancy -immune system problems -IUD in place -liver disease or tumor -problems with blood clots or take blood-thinners -seizures -use intravenous drugs -uterus of unusual shape -vaginal bleeding that has not been explained -an unusual or allergic reaction to levonorgestrel, other hormones, silicone, or polyethylene, medicines, foods, dyes, or preservatives -pregnant or trying to get pregnant -breast-feeding How should I use this medicine? This device is placed inside the uterus by a health care professional. Talk to your pediatrician regarding the use of this medicine in children. Special care may be needed. Overdosage: If you think you have taken too much of this medicine contact a poison control center or emergency room at once. NOTE: This medicine is only for you. Do not share this medicine with others. What if I miss a dose? This does not apply. Depending on the brand of device you have inserted, the device will need to be replaced every 3 to 5 years if you wish to continue using this type of birth control. What  may interact with this medicine? Do not take this medicine with any of the following medications: -amprenavir -bosentan -fosamprenavir This medicine may also interact with  the following medications: -aprepitant -armodafinil -barbiturate medicines for inducing sleep or treating seizures -bexarotene -boceprevir -griseofulvin -medicines to treat seizures like carbamazepine, ethotoin, felbamate, oxcarbazepine, phenytoin, topiramate -modafinil -pioglitazone -rifabutin -rifampin -rifapentine -some medicines to treat HIV infection like atazanavir, efavirenz, indinavir, lopinavir, nelfinavir, tipranavir, ritonavir -St. John's wort -warfarin This list may not describe all possible interactions. Give your health care provider a list of all the medicines, herbs, non-prescription drugs, or dietary supplements you use. Also tell them if you smoke, drink alcohol, or use illegal drugs. Some items may interact with your medicine. What should I watch for while using this medicine? Visit your doctor or health care professional for regular check ups. See your doctor if you or your partner has sexual contact with others, becomes HIV positive, or gets a sexual transmitted disease. This product does not protect you against HIV infection (AIDS) or other sexually transmitted diseases. You can check the placement of the IUD yourself by reaching up to the top of your vagina with clean fingers to feel the threads. Do not pull on the threads. It is a good habit to check placement after each menstrual period. Call your doctor right away if you feel more of the IUD than just the threads or if you cannot feel the threads at all. The IUD may come out by itself. You may become pregnant if the device comes out. If you notice that the IUD has come out use a backup birth control method like condoms and call your health care provider. Using tampons will not change the position of the IUD and are okay to use during your period. This IUD can be safely scanned with magnetic resonance imaging (MRI) only under specific conditions. Before you have an MRI, tell your healthcare provider that you have an  IUD in place, and which type of IUD you have in place. What side effects may I notice from receiving this medicine? Side effects that you should report to your doctor or health care professional as soon as possible: -allergic reactions like skin rash, itching or hives, swelling of the face, lips, or tongue -fever, flu-like symptoms -genital sores -high blood pressure -no menstrual period for 6 weeks during use -pain, swelling, warmth in the leg -pelvic pain or tenderness -severe or sudden headache -signs of pregnancy -stomach cramping -sudden shortness of breath -trouble with balance, talking, or walking -unusual vaginal bleeding, discharge -yellowing of the eyes or skin Side effects that usually do not require medical attention (report to your doctor or health care professional if they continue or are bothersome): -acne -breast pain -change in sex drive or performance -changes in weight -cramping, dizziness, or faintness while the device is being inserted -headache -irregular menstrual bleeding within first 3 to 6 months of use -nausea This list may not describe all possible side effects. Call your doctor for medical advice about side effects. You may report side effects to FDA at 1-800-FDA-1088. Where should I keep my medicine? This does not apply. NOTE: This sheet is a summary. It may not cover all possible information. If you have questions about this medicine, talk to your doctor, pharmacist, or health care provider.  2018 Elsevier/Gold Standard (2016-09-28 14:14:56)

## 2017-07-25 ENCOUNTER — Other Ambulatory Visit: Payer: Self-pay | Admitting: Advanced Practice Midwife

## 2017-07-25 DIAGNOSIS — Z8619 Personal history of other infectious and parasitic diseases: Secondary | ICD-10-CM

## 2017-08-13 ENCOUNTER — Ambulatory Visit: Payer: Medicaid Other

## 2018-03-25 ENCOUNTER — Telehealth: Payer: Self-pay | Admitting: Family Medicine

## 2018-03-25 NOTE — Telephone Encounter (Signed)
Patient want to speak with a nurse regarding one of her medications, she need a refill but she wasn't sure what medication was given to her

## 2018-04-16 ENCOUNTER — Ambulatory Visit: Payer: Self-pay | Admitting: Obstetrics & Gynecology

## 2018-04-16 NOTE — Telephone Encounter (Signed)
Called pt and left message stating that I am returning her call regarding a medication. Please call back with a new message on the nurse voicemail if you still have questions.

## 2018-07-30 ENCOUNTER — Other Ambulatory Visit: Payer: Self-pay | Admitting: Advanced Practice Midwife

## 2018-07-30 DIAGNOSIS — Z8619 Personal history of other infectious and parasitic diseases: Secondary | ICD-10-CM

## 2018-11-13 ENCOUNTER — Other Ambulatory Visit: Payer: Self-pay

## 2018-11-13 ENCOUNTER — Ambulatory Visit (HOSPITAL_COMMUNITY)
Admission: EM | Admit: 2018-11-13 | Discharge: 2018-11-13 | Disposition: A | Discharge status: Home / Self Care | Payer: PRIVATE HEALTH INSURANCE | Attending: Family Medicine | Admitting: Family Medicine

## 2018-11-13 ENCOUNTER — Encounter (HOSPITAL_COMMUNITY): Payer: Self-pay | Admitting: Emergency Medicine

## 2018-11-13 DIAGNOSIS — D573 Sickle-cell trait: Secondary | ICD-10-CM | POA: Insufficient documentation

## 2018-11-13 DIAGNOSIS — R300 Dysuria: Secondary | ICD-10-CM | POA: Diagnosis present

## 2018-11-13 DIAGNOSIS — N390 Urinary tract infection, site not specified: Secondary | ICD-10-CM | POA: Insufficient documentation

## 2018-11-13 DIAGNOSIS — R1030 Lower abdominal pain, unspecified: Secondary | ICD-10-CM | POA: Diagnosis present

## 2018-11-13 LAB — POCT PREGNANCY, URINE: Preg Test, Ur: NEGATIVE

## 2018-11-13 MED ORDER — NITROFURANTOIN MONOHYD MACRO 100 MG PO CAPS: 100.0000 mg | ORAL_CAPSULE | Freq: Two times a day (BID) | ORAL | 0 refills | Status: DC

## 2018-11-13 NOTE — Discharge Instructions (Addendum)
Your urine was positive for infection Will send for culture Macrobid x 5 days to treat infection Stay hydrated.

## 2018-11-13 NOTE — ED Provider Notes (Signed)
[place]    CSN: [id number] Arrival date & time: 11/13/18  0759     History   Chief Complaint Chief Complaint  Patient presents with  . Urinary Tract Infection    HPI Carolyn Blake is a 28 y.o. female.   Patient is a 28 year old female that presents for dysuria, urinary frequency and lower abdominal discomfort x5 days.  Symptoms have been constant and remain the same.  She has not done anything to treat her symptoms.  She denies any associated back pain, flank pain, vaginal discharge, vaginal itching or irritation.  She is currently sexually active with one partner.  Denies any concern for STDs or pregnancy. Denies any fever, chills, nausea.   ROS per HPI      Past Medical History:  Diagnosis Date  . Anemia   . Headache   . HSV-2 (herpes simplex virus 2) infection   . Irregular heart beat   . Keratoconus   . Ulcerative colitis [facility])     Patient Active Problem List   Diagnosis Date Noted  . History of sexually transmitted disease (CT and trich) 10/31/2016  . Ulcerative colitis ([place]) 10/03/2016  . History of PCR DNA positive for HSV2 10/03/2016  . Sickle cell trait ([place]) 10/03/2016  . Anemia 11/22/2015    Past Surgical History:  Procedure Laterality Date  . CESAREAN SECTION N/A 04/21/2017   Procedure: CESAREAN SECTION;  Surgeon: Osborne Oman, MD;  Location: [place];  Service: Obstetrics;  Laterality: N/A;  . NO PAST SURGERIES      OB History    Gravida  1   Para  1   Term  1   Preterm  0   AB  0   Living  1     SAB  0   TAB  0   Ectopic  0   Multiple  0   Live Births  1            Home Medications    Prior to Admission medications   Medication Sig Start Date End Date Taking? Authorizing Provider  nitrofurantoin, macrocrystal-monohydrate, (MACROBID) 100 MG capsule Take 1 capsule (100 mg total) by mouth 2 (two) times daily. 11/13/18   Orvan July, NP    Family History Family History  Problem  Relation Age of Onset  . Heart disease Father   . Diabetes Father     Social History Social History   Tobacco Use  . Smoking status: Never Smoker  . Smokeless tobacco: Never Used  Substance Use Topics  . Alcohol use: No  . Drug use: No     Allergies   Patient has no known allergies.   Review of Systems Review of Systems   Physical Exam Triage Vital Signs ED Triage Vitals  Enc Vitals Group     BP 11/13/18 0835 121/62     Pulse Rate 11/13/18 0835 82     Resp 11/13/18 0835 18     Temp 11/13/18 0835 98.4 F (36.9 C)     Temp Source 11/13/18 0835 Oral     SpO2 11/13/18 0835 99 %     Weight --      Height --      Head Circumference --      Peak Flow --      Pain Score 11/13/18 0832 4     Pain Loc --      Pain Edu? --      Excl. in [place]? --  No data found.  Updated Vital Signs BP 121/62 (BP Location: Right Arm)   Pulse 82   Temp 98.4 F (36.9 C) (Oral)   Resp 18   LMP 10/06/2018   SpO2 99%   Visual Acuity Right Eye Distance:   Left Eye Distance:   Bilateral Distance:    Right Eye Near:   Left Eye Near:    Bilateral Near:     Physical Exam  Constitutional: She appears well-developed and well-nourished. No distress.  HENT:  Head: Normocephalic and atraumatic.  Eyes: Conjunctivae are normal.  Neck: Normal range of motion.  Pulmonary/Chest: Effort normal.  Abdominal: Soft. Bowel sounds are normal. She exhibits no distension and no mass. There is no tenderness. There is no rebound and no guarding. No hernia.  Musculoskeletal: Normal range of motion.  Neurological: She is alert.  Skin: Skin is warm and dry. No rash noted. She is not diaphoretic. No erythema. No pallor.  Psychiatric: She has a normal mood and affect.  Nursing note and vitals reviewed.    UC Treatments / Results  Labs (all labs ordered are listed, but only abnormal results are displayed) Labs Reviewed  URINE CULTURE  POCT PREGNANCY, URINE    EKG None  Radiology No results  found.  Procedures Procedures (including critical care time)  Medications Ordered in UC Medications - No data to display  Initial Impression / Assessment and Plan / UC Course  I have reviewed the triage vital signs and the nursing notes.  Pertinent labs & imaging results that were available during my care of the patient were reviewed by me and considered in my medical decision making (see chart for details).     Urine positive for urinary tract infection. Will treat with Macrobid x 5 days.  Will send for culture Pregnancy negative  Final Clinical Impressions(s) / UC Diagnoses   Final diagnoses:  Lower urinary tract infectious disease     Discharge Instructions     Your urine was positive for infection Will send for culture Macrobid x 5 days to treat infection Stay hydrated.     ED Prescriptions    Medication Sig Dispense Auth. Provider   nitrofurantoin, macrocrystal-monohydrate, (MACROBID) 100 MG capsule Take 1 capsule (100 mg total) by mouth 2 (two) times daily. 10 capsule Loura Halt A, NP     Controlled Substance Prescriptions [place] Controlled Substance Registry consulted? Not Applicable   Orvan July, NP 11/13/18 [phone]

## 2018-11-13 NOTE — ED Triage Notes (Signed)
Frequent urination, painful urination, intermittent lower abdominal pain.  Onset 5 days ago.

## 2018-11-13 NOTE — ED Notes (Signed)
Urinalysis testing exceeded the limitations of the [employer] capabilities. Urine culture ordered and obtained

## 2018-11-15 LAB — URINE CULTURE: Culture: 70000 — AB

## 2019-03-11 ENCOUNTER — Encounter (HOSPITAL_COMMUNITY): Payer: Self-pay

## 2019-03-11 ENCOUNTER — Ambulatory Visit (HOSPITAL_COMMUNITY)
Admission: EM | Admit: 2019-03-11 | Discharge: 2019-03-11 | Disposition: A | Discharge status: Home / Self Care | Payer: PRIVATE HEALTH INSURANCE | Attending: Family Medicine | Admitting: Family Medicine

## 2019-03-11 DIAGNOSIS — N390 Urinary tract infection, site not specified: Secondary | ICD-10-CM | POA: Diagnosis not present

## 2019-03-11 DIAGNOSIS — R319 Hematuria, unspecified: Secondary | ICD-10-CM

## 2019-03-11 DIAGNOSIS — Z3202 Encounter for pregnancy test, result negative: Secondary | ICD-10-CM | POA: Diagnosis not present

## 2019-03-11 LAB — POCT URINALYSIS DIP (DEVICE)
Bilirubin Urine: NEGATIVE
Glucose, UA: NEGATIVE mg/dL
Nitrite: NEGATIVE
Protein, ur: 100 mg/dL — AB
Specific Gravity, Urine: 1.025 (ref 1.005–1.030)
Urobilinogen, UA: 0.2 mg/dL (ref 0.0–1.0)
pH: 5.5 (ref 5.0–8.0)

## 2019-03-11 LAB — POCT PREGNANCY, URINE: Preg Test, Ur: NEGATIVE

## 2019-03-11 MED ORDER — NITROFURANTOIN MONOHYD MACRO 100 MG PO CAPS: 100.0000 mg | ORAL_CAPSULE | Freq: Two times a day (BID) | ORAL | 0 refills | Status: AC

## 2019-03-11 NOTE — ED Provider Notes (Signed)
[place]    CSN: [id number] Arrival date & time: 03/11/19  6834     History   Chief Complaint Chief Complaint  Patient presents with  . Urinary Tract Infection    HPI Carolyn Blake is a 29 y.o. female no contribute past medical history presenting today for evaluation of possible UTI.  Patient states that she has had urinary frequency, urgency and slight dysuria.  She has also noticed a change in the color of her urine and believes that there might be a slight amount of blood present.  Symptoms began approximately 3 days ago.  Denies any pelvic symptoms of vaginal discharge, itching or irritation.  Denies fever, nausea vomiting or back pain.  She has not taken any over-the-counter medicines for her symptoms.  Patient recently had a UTI in November which responded well to [facility] and was sensitive.  Symptoms fully resolved.  Denies history of recurrent UTI.  Does admit that she does not drink a lot of fluids and often holds her bladder.  HPI  Past Medical History:  Diagnosis Date  . Anemia   . Headache   . HSV-2 (herpes simplex virus 2) infection   . Irregular heart beat   . Keratoconus   . Ulcerative colitis [facility])     Patient Active Problem List   Diagnosis Date Noted  . History of sexually transmitted disease (CT and trich) 10/31/2016  . Ulcerative colitis ([place]) 10/03/2016  . History of PCR DNA positive for HSV2 10/03/2016  . Sickle cell trait ([place]) 10/03/2016  . Anemia 11/22/2015    Past Surgical History:  Procedure Laterality Date  . CESAREAN SECTION N/A 04/21/2017   Procedure: CESAREAN SECTION;  Surgeon: Osborne Oman, MD;  Location: [place];  Service: Obstetrics;  Laterality: N/A;  . NO PAST SURGERIES      OB History    Gravida  1   Para  1   Term  1   Preterm  0   AB  0   Living  1     SAB  0   TAB  0   Ectopic  0   Multiple  0   Live Births  1            Home Medications    Prior to Admission  medications   Medication Sig Start Date End Date Taking? Authorizing Provider  nitrofurantoin, macrocrystal-monohydrate, (MACROBID) 100 MG capsule Take 1 capsule (100 mg total) by mouth 2 (two) times daily for 5 days. 03/11/19 03/16/19  Ariely Riddell, Elesa Hacker, PA-C    Family History Family History  Problem Relation Age of Onset  . Heart disease Father   . Diabetes Father     Social History Social History   Tobacco Use  . Smoking status: Never Smoker  . Smokeless tobacco: Never Used  Substance Use Topics  . Alcohol use: No  . Drug use: No     Allergies   Patient has no known allergies.   Review of Systems Review of Systems  Constitutional: Negative for fever.  Respiratory: Negative for shortness of breath.   Cardiovascular: Negative for chest pain.  Gastrointestinal: Negative for abdominal pain, diarrhea, nausea and vomiting.  Genitourinary: Positive for dysuria, frequency and hematuria. Negative for flank pain, genital sores, menstrual problem, vaginal bleeding, vaginal discharge and vaginal pain.  Musculoskeletal: Negative for back pain.  Skin: Negative for rash.  Neurological: Negative for dizziness, light-headedness and headaches.     Physical Exam Triage Vital Signs ED  Triage Vitals  Enc Vitals Group     BP 03/11/19 0830 105/70     Pulse Rate 03/11/19 0830 73     Resp 03/11/19 0830 16     Temp 03/11/19 0830 98.9 F (37.2 C)     Temp Source 03/11/19 0830 Temporal     SpO2 03/11/19 0830 100 %     Weight --      Height --      Head Circumference --      Peak Flow --      Pain Score 03/11/19 0840 3     Pain Loc --      Pain Edu? --      Excl. in [place]? --    No data found.  Updated Vital Signs BP 105/70 (BP Location: Left Arm)   Pulse 73   Temp 98.9 F (37.2 C) (Temporal)   Resp 16   LMP 02/12/2019   SpO2 100%   Visual Acuity Right Eye Distance:   Left Eye Distance:   Bilateral Distance:    Right Eye Near:   Left Eye Near:    Bilateral Near:      Physical Exam Vitals signs and nursing note reviewed.  Constitutional:      General: She is not in acute distress.    Appearance: She is well-developed.  HENT:     Head: Normocephalic and atraumatic.  Eyes:     Conjunctiva/sclera: Conjunctivae normal.  Neck:     Musculoskeletal: Neck supple.  Cardiovascular:     Rate and Rhythm: Normal rate and regular rhythm.     Heart sounds: No murmur.  Pulmonary:     Effort: Pulmonary effort is normal. No respiratory distress.     Breath sounds: Normal breath sounds.  Abdominal:     Palpations: Abdomen is soft.     Tenderness: There is no abdominal tenderness.     Comments: Nontender light deep palpation throughout abdomen, negative rebound No CVA tenderness  Skin:    General: Skin is warm and dry.  Neurological:     Mental Status: She is alert.      UC Treatments / Results  Labs (all labs ordered are listed, but only abnormal results are displayed) Labs Reviewed  POCT URINALYSIS DIP (DEVICE) - Abnormal; Notable for the following components:      Result Value   Ketones, ur TRACE (*)    Hgb urine dipstick MODERATE (*)    Protein, ur 100 (*)    Leukocytes,Ua MODERATE (*)    All other components within normal limits  URINE CULTURE  POC URINE PREG, ED  POCT PREGNANCY, URINE    EKG None  Radiology No results found.  Procedures Procedures (including critical care time)  Medications Ordered in UC Medications - No data to display  Initial Impression / Assessment and Plan / UC Course  I have reviewed the triage vital signs and the nursing notes.  Pertinent labs & imaging results that were available during my care of the patient were reviewed by me and considered in my medical decision making (see chart for details).     Moderate leuks on UA, urine culture obtained.  Will treat for UTI with Macrobid twice daily x5 days.  Push fluids.  Continue to monitor, will alter treatment if needed.  Continue to monitor  symptoms,Discussed strict return precautions. Patient verbalized understanding and is agreeable with plan.  Final Clinical Impressions(s) / UC Diagnoses   Final diagnoses:  Lower urinary tract infectious disease  Discharge Instructions     Urine showed evidence of infection. We are treating you with macrobid. Be sure to take full course. Stay hydrated- urine should be pale yellow to clear. My continue azo for relief of burning while infection is being cleared.   Please return or follow up with your primary provider if symptoms not improving with treatment. Please return sooner if you have worsening of symptoms or develop fever, nausea, vomiting, abdominal pain, back pain, lightheadedness, dizziness.   ED Prescriptions    Medication Sig Dispense Auth. Provider   nitrofurantoin, macrocrystal-monohydrate, (MACROBID) 100 MG capsule Take 1 capsule (100 mg total) by mouth 2 (two) times daily for 5 days. 10 capsule Delvina Mizzell C, PA-C     Controlled Substance Prescriptions [place] Controlled Substance Registry consulted? Not Applicable   Janith Lima, [place] 03/11/19 0900

## 2019-03-11 NOTE — Discharge Instructions (Addendum)
Urine showed evidence of infection. We are treating you with macrobid. Be sure to take full course. Stay hydrated- urine should be pale yellow to clear. My continue azo for relief of burning while infection is being cleared.   Please return or follow up with your primary provider if symptoms not improving with treatment. Please return sooner if you have worsening of symptoms or develop fever, nausea, vomiting, abdominal pain, back pain, lightheadedness, dizziness.

## 2019-03-11 NOTE — ED Triage Notes (Signed)
Pt presents with urinary frequency, urgency, and discomfort after urinating.

## 2019-03-13 ENCOUNTER — Telehealth (HOSPITAL_COMMUNITY): Payer: Self-pay

## 2019-03-13 LAB — URINE CULTURE: Culture: 20000 — AB

## 2019-03-16 ENCOUNTER — Telehealth (HOSPITAL_COMMUNITY): Payer: Self-pay | Admitting: Emergency Medicine

## 2019-03-16 NOTE — Telephone Encounter (Signed)
Attempted to reach patient. No answer at this time.

## 2020-03-28 ENCOUNTER — Ambulatory Visit (INDEPENDENT_AMBULATORY_CARE_PROVIDER_SITE_OTHER): Payer: PRIVATE HEALTH INSURANCE | Admitting: General Practice

## 2020-03-28 ENCOUNTER — Encounter: Payer: Self-pay | Admitting: Family Medicine

## 2020-03-28 ENCOUNTER — Other Ambulatory Visit: Payer: Self-pay

## 2020-03-28 DIAGNOSIS — Z3201 Encounter for pregnancy test, result positive: Secondary | ICD-10-CM | POA: Diagnosis not present

## 2020-03-28 DIAGNOSIS — O219 Vomiting of pregnancy, unspecified: Secondary | ICD-10-CM

## 2020-03-28 LAB — POCT PREGNANCY, URINE: Preg Test, Ur: POSITIVE — AB

## 2020-03-28 MED ORDER — PRENATAL PLUS 27-1 MG PO TABS: 1.0000 | ORAL_TABLET | Freq: Every day | ORAL | 11 refills | Status: DC

## 2020-03-28 MED ORDER — PROMETHAZINE HCL 25 MG PO TABS: 25.0000 mg | ORAL_TABLET | Freq: Four times a day (QID) | ORAL | 0 refills | Status: DC | PRN

## 2020-03-28 NOTE — Progress Notes (Signed)
Patient presents to office today for UPT. UPT +. Patient reports first positive home test 2 weeks ago. LMP 02/06/20 EDD 11/12/20 [redacted]w[redacted]d Patient denies taking any meds or vitamins, reports some nausea. PNV & Phenergan sent in per protocol. Patient will begin OB care.   CKoren BoundRN BSN 03/28/20

## 2020-03-28 NOTE — Progress Notes (Signed)
Patient seen and assessed by nursing staff.  Agree with documentation and plan.  

## 2020-04-20 ENCOUNTER — Other Ambulatory Visit: Payer: Self-pay

## 2020-04-20 ENCOUNTER — Ambulatory Visit (INDEPENDENT_AMBULATORY_CARE_PROVIDER_SITE_OTHER): Payer: Self-pay | Admitting: *Deleted

## 2020-04-20 DIAGNOSIS — Z8619 Personal history of other infectious and parasitic diseases: Secondary | ICD-10-CM

## 2020-04-20 DIAGNOSIS — I499 Cardiac arrhythmia, unspecified: Secondary | ICD-10-CM

## 2020-04-20 DIAGNOSIS — K5190 Ulcerative colitis, unspecified, without complications: Secondary | ICD-10-CM

## 2020-04-20 DIAGNOSIS — O0990 Supervision of high risk pregnancy, unspecified, unspecified trimester: Secondary | ICD-10-CM

## 2020-04-20 NOTE — Progress Notes (Signed)
I connected with  Carolyn Blake on 04/20/20 at  2:30 PM EDT by telephone and verified that I am speaking with the correct person using two identifiers.   I discussed the limitations, risks, security and privacy concerns of performing an evaluation and management service by telephone and the availability of in person appointments. I also discussed with the patient that there may be a patient responsible charge related to this service. The patient expressed understanding and agreed to proceed.  I explained I am completing her New OB Intake today. We discussed Her EDD and that it is based on  sure LMP . I reviewed her allergies, meds, OB History, Medical /Surgical history, and appropriate screenings. I informed her of [facility] services.  I explained I will send her the Babyscripts app and app was sent to her while on phone.  I explained we will ask her to take her blood pressure weekly during her pregnancy. I asked her if she can purchase a blood pressure cuff because her insurance does not cover a Rx for blood pressure  cuff . .  I asked her to bring the blood pressure cuff with her to her first ob appointment so we can show her how to use it. Explained  then we will have her take her blood pressure weekly and enter into the app. I explained she will have some visits in office and some virtually. I sent her a text and she signed up for  MyChart and downloaded the  app. I reviewed her new ob  appointment date/ time with her , our location and to wear mask, no visitors.  I explained she will have a pelvic exam, ob bloodwork, hemoglobin a1C, cbg ,pap, and  genetic testing if desired,- she does want a panorama. I scheduled an [place] at 19 weeks and gave her the appointment. She voices understanding.   Janan Bogie,RN 04/20/2020  2:32 PM

## 2020-04-20 NOTE — Patient Instructions (Signed)
  At [facility], we work as an integrated team, providing care to address both physical and emotional health. Your medical provider may refer you to see our [facility]) on the same day you see your medical provider, as availability permits.  Our [facility] is available to all patients, visits generally last between 20-30 minutes, but can be longer or shorter, depending on patient need. The [facility] offers help with stress management, coping with symptoms of depression and anxiety, major life changes , sleep issues, changing risky behavior, grief and loss, life stress, working on personal life goals, and  behavioral health issues, as these all affect your overall health and wellness.  The [facility] is NOT available for the following: court-ordered evaluations, specialty assessments (custody or disability), letters to employers, or obtaining certification for an emotional support animal. The [facility] does not provide long-term therapeutic services. You have the right to refuse integrated behavioral health services, or to reschedule to see the [facility] at a later date.  Exception: If you are having thoughts of suicide, we require that you either see the [facility] for further assessment, or contract for safety with your medical provider prior to checking out.  Confidentiality exception: If it is suspected that a child or disabled adult is being abused or neglected, we are required by law to report that to either Child Protective Services or Adult Scientist, forensic.  If you have a diagnosis of Bipolar affective disorder, Schizophrenia, or recurrent Major depressive disorder, we will recommend that you establish care with a psychiatrist, as these are lifelong, chronic conditions, and we want your overall emotional health and medications to be more closely monitored. If you anticipate needing extended maternity leave due to mental illness, it it recommended that you find a psychiatrist as soon as possible.  Neither the medical provider, nor the [facility], can recommend an extended maternity leave for mental health issues. Your medical provider or [facility] may refer you to a therapist for ongoing, traditional therapy, or to a psychiatrist, for medication management, if it would benefit your overall health. Depending on your insurance, you may have a copay to see the [facility]. If you are uninsured, it is recommended that you apply for financial assistance. (Forms may be requested at the front desk for in-person visits, via MyChart, or request a form during a virtual visit).  If you see the [facility] more than 6 times, you will have to complete a comprehensive clinical assessment interview with the [facility] to resume integrated services.  Any questions?

## 2020-04-24 NOTE — Progress Notes (Deleted)
INITIAL OBSTETRICAL VISIT Patient name: Carolyn Blake MRN [id number]  Date of birth: 10/08/1990 Chief Complaint:   No chief complaint on file.  History of Present Illness:   Carolyn Blake is a 30 y.o. G78P1011 African American female at 27w2dby *** with an Estimated Date of Delivery: 11/12/20 being seen today for her initial obstetrical visit.  Her obstetrical history is significant for h/o SCT, Ulcerative Colitis and HSV 2. This is a *** planned pregnancy. She and the father of the baby (FOB) "***" *** live together. She has a support system that consists of ***her mother/father/friends/***. Today she reports {sx:14538}.   Patient's last menstrual period was 02/06/2020. Last pap ***. Results were: {Desc; normal/abnormal w/wildcard:19060} Review of Systems:   Pertinent items are noted in HPI Denies cramping/contractions, leakage of fluid, vaginal bleeding, abnormal vaginal discharge w/ itching/odor/irritation, headaches, visual changes, shortness of breath, chest pain, abdominal pain, severe nausea/vomiting, or problems with urination or bowel movements unless otherwise stated above.  Pertinent History Reviewed:  Reviewed past medical,surgical, social, obstetrical and family history.  Reviewed problem list, medications and allergies. OB History  Gravida Para Term Preterm AB Living  3 1 1  0 1 1  SAB TAB Ectopic Multiple Live Births  0 0 0 0 1    # Outcome Date GA Lbr Len/2nd Weight Sex Delivery Anes PTL Lv  3 Current           2 AB 01/2020          1 Term 04/21/17 422w0d6 lb 4.4 oz (2.846 kg) M CS-LTranv EPI  LIV     Birth Comments: IOL postdates; [facility] Physical Assessment:  There were no vitals filed for this visit.There is no height or weight on file to calculate BMI.       Physical Examination:  General appearance - well appearing, and in no distress  Mental status - alert, oriented to person, place, and time  Psych:  She has a normal mood and affect  Skin  - warm and dry, normal color, no suspicious lesions noted  Chest - effort normal, all lung fields clear to auscultation bilaterally  Heart - normal rate and regular rhythm  Abdomen - soft, nontender  Extremities:  No swelling or varicosities noted  Pelvic - VULVA: normal appearing vulva with no masses, tenderness or lesions  VAGINA: normal appearing vagina with normal color and discharge, no lesions.   CERVIX: normal appearing cervix without discharge or lesions, no CMT  Thin prep pap is {Desc; done/not:10129} *** HR HPV cotesting   No results found for this or any previous visit (from the past 24 hour(s)).  Assessment & Plan:  1) High-Risk Pregnancy G3P1011 at 1156w1dth an Estimated Date of Delivery: 11/12/20   2) Initial OB visit - Welcomed to practice and introduced self to patient in addition to discussing other advanced practice providers that she may be seeing at this practice - Congratulated patient - Anticipatory guidance on upcoming appointments - Educated on COVBrowervilled pregnancy and the integration of virtual appointments  - Educated on babyscripts app- patient reports she has not received email, encouraged to look in spam folder and to call office if she still has not received email - patient verbalizes understanding  3) Supervision of high risk pregnancy, antepartum - [place] Comp + 14 Wk - Information provided on ***  4) History of PCR DNA positive for HSV2 - Plan Valtrex suppression at 36 wks  5) Nausea  and vomiting during pregnancy - Continue antiemetic prn  6) Previous cesarean delivery affecting pregnancy, antepartum - Planning RCS - Will meet with MD by 32 wks to discuss surgery r/b  Meds: No orders of the defined types were placed in this encounter.   Initial labs obtained Continue prenatal vitamins Reviewed n/v relief measures and warning s/s to report Reviewed recommended weight gain based on pre-gravid BMI Encouraged well-balanced diet Genetic Screening  discussed: ordered Cystic fibrosis, SMA, Fragile X screening discussed ordered The nature of [place] with multiple MDs and other Advanced Practice Providers was explained to patient; also emphasized that residents, students are part of our team.  Discussed optimized OB schedule and video visits. Advised can have an in-office visit whenever she feels she needs to be seen.  Does own BP cuff & scale. Explained to patient that BP will be mailed to her house. Check BP weekly, let us know if >140/90. Advised to call during normal business hours and there is an after-hours nurse line available.    Follow-up: No follow-ups on file.   No orders of the defined types were placed in this encounter.   Laury Deep MSN, CNM 04/24/2020

## 2020-04-24 NOTE — Progress Notes (Signed)
I have reviewed the chart and agree with nursing staff's documentation of this patient's encounter.  Laury Deep, CNM 04/24/2020 11:11 PM

## 2020-04-25 ENCOUNTER — Ambulatory Visit (INDEPENDENT_AMBULATORY_CARE_PROVIDER_SITE_OTHER): Payer: PRIVATE HEALTH INSURANCE | Admitting: Obstetrics and Gynecology

## 2020-04-25 ENCOUNTER — Other Ambulatory Visit (HOSPITAL_COMMUNITY)
Admission: RE | Admit: 2020-04-25 | Discharge: 2020-04-25 | Disposition: A | Discharge status: Home / Self Care | Payer: PRIVATE HEALTH INSURANCE | Source: Ambulatory Visit | Attending: Obstetrics and Gynecology | Admitting: Obstetrics and Gynecology

## 2020-04-25 ENCOUNTER — Other Ambulatory Visit: Payer: Self-pay

## 2020-04-25 ENCOUNTER — Encounter: Payer: Self-pay | Admitting: Obstetrics and Gynecology

## 2020-04-25 VITALS — BP 99/67 | HR 76 | Wt 174.5 lb

## 2020-04-25 DIAGNOSIS — O0990 Supervision of high risk pregnancy, unspecified, unspecified trimester: Secondary | ICD-10-CM | POA: Insufficient documentation

## 2020-04-25 DIAGNOSIS — O219 Vomiting of pregnancy, unspecified: Secondary | ICD-10-CM

## 2020-04-25 DIAGNOSIS — Z23 Encounter for immunization: Secondary | ICD-10-CM | POA: Diagnosis not present

## 2020-04-25 DIAGNOSIS — Z8619 Personal history of other infectious and parasitic diseases: Secondary | ICD-10-CM

## 2020-04-25 DIAGNOSIS — O34219 Maternal care for unspecified type scar from previous cesarean delivery: Secondary | ICD-10-CM

## 2020-04-25 NOTE — Progress Notes (Signed)
INITIAL OBSTETRICAL VISIT Patient name: Carolyn Blake MRN [id number]  Date of birth: 06/16/1990 Chief Complaint:   Initial Prenatal Visit  History of Present Illness:   Carolyn Blake is a 30 y.o. G21P1011 African American female at 75w2dby LMP with an Estimated Date of Delivery: 11/12/20 being seen today for her initial obstetrical visit.  Her obstetrical history is significant for h/o SCT, Ulcerative Colitis, HSV 2, and anemia. This is a planned pregnancy. She has a support system that consists of the FOB/her mother/father/friends. Today she reports no complaints.   Patient's last menstrual period was 02/06/2020. Last pap 08/2016. Results were: normal Review of Systems:   Pertinent items are noted in HPI Denies cramping/contractions, leakage of fluid, vaginal bleeding, abnormal vaginal discharge w/ itching/odor/irritation, headaches, visual changes, shortness of breath, chest pain, abdominal pain, severe nausea/vomiting, or problems with urination or bowel movements unless otherwise stated above.  Pertinent History Reviewed:  Reviewed past medical,surgical, social, obstetrical and family history.  Reviewed problem list, medications and allergies. OB History  Gravida Para Term Preterm AB Living  3 1 1  0 1 1  SAB TAB Ectopic Multiple Live Births  0 0 0 0 1    # Outcome Date GA Lbr Len/2nd Weight Sex Delivery Anes PTL Lv  3 Current           2 AB 01/2020          1 Term 04/21/17 456w0d6 lb 4.4 oz (2.846 kg) M CS-LTranv EPI  LIV     Birth Comments[facility] postdates[facility]:   Vitals:   04/25/20 0858  BP: 99/67  Pulse: 76  Weight: 174 lb 8 oz (79.2 kg)  Body mass index is 29.95 kg/m.       Physical Examination:  General appearance - well appearing, and in no distress  Mental status - alert, oriented to person, place, and time  Psych:  She has a normal mood and affect  Skin - warm and dry, normal color, no suspicious lesions noted  Chest -  effort normal, all lung fields clear to auscultation bilaterally  Heart - normal rate and regular rhythm  Abdomen - soft, nontender  Extremities:  No swelling or varicosities noted  Pelvic - VULVA: normal appearing vulva with no masses, tenderness or lesions  VAGINA: normal appearing vagina with normal color and discharge, no lesions.   CERVIX: normal appearing cervix without discharge or lesions, no CMT  Thin prep pap is done    No results found for this or any previous visit (from the past 24 hour(s)).  Assessment & Plan:  1) High-Risk Pregnancy G3P1011 at 1125w1dth an Estimated Date of Delivery: 11/12/20   2) Initial OB visit - Welcomed to practice and introduced self to patient in addition to discussing other advanced practice providers that she may be seeing at this practice - Congratulated patient - Anticipatory guidance on upcoming appointments - [place] and the integration of virtual appointments  - Educated on babyscripts app- patient reports she has not received email, encouraged to look in spam folder and to call office if she still has not received email - patient verbalizes understanding  3) Supervision of high risk pregnancy, antepartum - [place] Comp + 14 Wk - Information provided on second trimester pregnancy, healthy eating plan in pregnancy and healthy weight gain in pregnancy   4) History of PCR DNA positive for HSV2 - Plan Valtrex suppression at  36 wks  5) Nausea and vomiting during pregnancy - Continue antiemetic prn  6) Previous cesarean delivery affecting pregnancy, antepartum - Planning RCS - Will meet with MD by 32 wks to discuss surgery r/b  Meds: No orders of the defined types were placed in this encounter.   Initial labs obtained Continue prenatal vitamins Reviewed n/v relief measures and warning s/s to report Reviewed recommended weight gain based on pre-gravid BMI Encouraged well-balanced diet Genetic Screening discussed:  ordered Cystic fibrosis, SMA, Fragile X screening discussed ordered The nature of [facility] with multiple MDs and other Advanced Practice Providers was explained to patient; also emphasized that residents, students are part of our team.  Discussed optimized OB schedule and video visits. Advised can have an in-office visit whenever she feels she needs to be seen.  Does own BP cuff & scale. Explained to patient that BP will be mailed to her house. Check BP weekly, let us know if >140/90. Advised to call during normal business hours and there is an after-hours nurse line available.    Follow-up: Return in about 2 months (around 06/25/2020) for Return OB - My Chart video.   Orders Placed This Encounter  Procedures  . Culture, OB Urine  . Genetic Screening  . Obstetric Panel, Including HIV  . Hemoglobin A1c    Laury Deep MSN, [place] 04/25/2020

## 2020-04-25 NOTE — Addendum Note (Signed)
Addended by: Bethanne Ginger on: 04/25/2020 11:06 AM   Modules accepted: Orders

## 2020-04-25 NOTE — Patient Instructions (Signed)
Second Trimester of Pregnancy The second trimester is from week 14 through week 27 (months 4 through 6). The second trimester is often a time when you feel your best. Your body has adjusted to being pregnant, and you begin to feel better physically. Usually, morning sickness has lessened or quit completely, you may have more energy, and you may have an increase in appetite. The second trimester is also a time when the fetus is growing rapidly. At the end of the sixth month, the fetus is about 9 inches long and weighs about 1 pounds. You will likely begin to feel the baby move (quickening) between 16 and 20 weeks of pregnancy. Body changes during your second trimester Your body continues to go through many changes during your second trimester. The changes vary from woman to woman.  Your weight will continue to increase. You will notice your lower abdomen bulging out.  You may begin to get stretch marks on your hips, abdomen, and breasts.  You may develop headaches that can be relieved by medicines. The medicines should be approved by your health care provider.  You may urinate more often because the fetus is pressing on your bladder.  You may develop or continue to have heartburn as a result of your pregnancy.  You may develop constipation because certain hormones are causing the muscles that push waste through your intestines to slow down.  You may develop hemorrhoids or swollen, bulging veins (varicose veins).  You may have back pain. This is caused by: ? Weight gain. ? Pregnancy hormones that are relaxing the joints in your pelvis. ? A shift in weight and the muscles that support your balance.  Your breasts will continue to grow and they will continue to become tender.  Your gums may bleed and may be sensitive to brushing and flossing.  Dark spots or blotches (chloasma, mask of pregnancy) may develop on your face. This will likely fade after the baby is born.  A dark line from your  belly button to the pubic area (linea nigra) may appear. This will likely fade after the baby is born.  You may have changes in your hair. These can include thickening of your hair, rapid growth, and changes in texture. Some women also have hair loss during or after pregnancy, or hair that feels dry or thin. Your hair will most likely return to normal after your baby is born. What to expect at prenatal visits During a routine prenatal visit:  You will be weighed to make sure you and the fetus are growing normally.  Your blood pressure will be taken.  Your abdomen will be measured to track your baby's growth.  The fetal heartbeat will be listened to.  Any test results from the previous visit will be discussed. Your health care provider may ask you:  How you are feeling.  If you are feeling the baby move.  If you have had any abnormal symptoms, such as leaking fluid, bleeding, severe headaches, or abdominal cramping.  If you are using any tobacco products, including cigarettes, chewing tobacco, and electronic cigarettes.  If you have any questions. Other tests that may be performed during your second trimester include:  Blood tests that check for: ? Low iron levels (anemia). ? High blood sugar that affects pregnant women (gestational diabetes) between 49 and 28 weeks. ? Rh antibodies. This is to check for a protein on red blood cells (Rh factor).  Urine tests to check for infections, diabetes, or protein in the  urine.  An ultrasound to confirm the proper growth and development of the baby.  An amniocentesis to check for possible genetic problems.  Fetal screens for spina bifida and Down syndrome.  HIV (human immunodeficiency virus) testing. Routine prenatal testing includes screening for HIV, unless you choose not to have this test. Follow these instructions at home: Medicines  Follow your health care provider's instructions regarding medicine use. Specific medicines may be  either safe or unsafe to take during pregnancy.  Take a prenatal vitamin that contains at least 600 micrograms (mcg) of folic acid.  If you develop constipation, try taking a stool softener if your health care provider approves. Eating and drinking   Eat a balanced diet that includes fresh fruits and vegetables, whole grains, good sources of protein such as meat, eggs, or tofu, and low-fat dairy. Your health care provider will help you determine the amount of weight gain that is right for you.  Avoid raw meat and uncooked cheese. These carry germs that can cause birth defects in the baby.  If you have low calcium intake from food, talk to your health care provider about whether you should take a daily calcium supplement.  Limit foods that are high in fat and processed sugars, such as fried and sweet foods.  To prevent constipation: ? Drink enough fluid to keep your urine clear or pale yellow. ? Eat foods that are high in fiber, such as fresh fruits and vegetables, whole grains, and beans. Activity  Exercise only as directed by your health care provider. Most women can continue their usual exercise routine during pregnancy. Try to exercise for 30 minutes at least 5 days a week. Stop exercising if you experience uterine contractions.  Avoid heavy lifting, wear low heel shoes, and practice good posture.  A sexual relationship may be continued unless your health care provider directs you otherwise. Relieving pain and discomfort  Wear a good support bra to prevent discomfort from breast tenderness.  Take warm sitz baths to soothe any pain or discomfort caused by hemorrhoids. Use hemorrhoid cream if your health care provider approves.  Rest with your legs elevated if you have leg cramps or low back pain.  If you develop varicose veins, wear support hose. Elevate your feet for 15 minutes, 3-4 times a day. Limit salt in your diet. Prenatal Care  Write down your questions. Take them to  your prenatal visits.  Keep all your prenatal visits as told by your health care provider. This is important. Safety  Wear your seat belt at all times when driving.  Make a list of emergency phone numbers, including numbers for family, friends, the hospital, and police and fire departments. General instructions  Ask your health care provider for a referral to a local prenatal education class. Begin classes no later than the beginning of month 6 of your pregnancy.  Ask for help if you have counseling or nutritional needs during pregnancy. Your health care provider can offer advice or refer you to specialists for help with various needs.  Do not use hot tubs, steam rooms, or saunas.  Do not douche or use tampons or scented sanitary pads.  Do not cross your legs for long periods of time.  Avoid cat litter boxes and soil used by cats. These carry germs that can cause birth defects in the baby and possibly loss of the fetus by miscarriage or stillbirth.  Avoid all smoking, herbs, alcohol, and unprescribed drugs. Chemicals in these products can affect the formation  and growth of the baby.  Do not use any products that contain nicotine or tobacco, such as cigarettes and e-cigarettes. If you need help quitting, ask your health care provider.  Visit your dentist if you have not gone yet during your pregnancy. Use a soft toothbrush to brush your teeth and be gentle when you floss. Contact a health care provider if:  You have dizziness.  You have mild pelvic cramps, pelvic pressure, or nagging pain in the abdominal area.  You have persistent nausea, vomiting, or diarrhea.  You have a bad smelling vaginal discharge.  You have pain when you urinate. Get help right away if:  You have a fever.  You are leaking fluid from your vagina.  You have spotting or bleeding from your vagina.  You have severe abdominal cramping or pain.  You have rapid weight gain or weight loss.  You have  shortness of breath with chest pain.  You notice sudden or extreme swelling of your face, hands, ankles, feet, or legs.  You have not felt your baby move in over an hour.  You have severe headaches that do not go away when you take medicine.  You have vision changes. Summary  The second trimester is from week 14 through week 27 (months 4 through 6). It is also a time when the fetus is growing rapidly.  Your body goes through many changes during pregnancy. The changes vary from woman to woman.  Avoid all smoking, herbs, alcohol, and unprescribed drugs. These chemicals affect the formation and growth your baby.  Do not use any tobacco products, such as cigarettes, chewing tobacco, and e-cigarettes. If you need help quitting, ask your health care provider.  Contact your health care provider if you have any questions. Keep all prenatal visits as told by your health care provider. This is important. This information is not intended to replace advice given to you by your health care provider. Make sure you discuss any questions you have with your health care provider. Document Revised: 04/10/2019 Document Reviewed: 01/22/2017 Elsevier Patient Education  2020 [place] for Pregnant Women While you are pregnant, your body requires additional nutrition to help support your growing baby. You also have a higher need for some vitamins and minerals, such as folic acid, calcium, iron, and vitamin D. Eating a healthy, well-balanced diet is very important for your health and your baby's health. Your need for extra calories varies for the three 22-monthsegments of your pregnancy (trimesters). For most women, it is recommended to consume:  150 extra calories a day during the first trimester.  300 extra calories a day during the second trimester.  300 extra calories a day during the third trimester. What are tips for following this plan?   Do not try to lose weight or go on a diet  during pregnancy.  Limit your overall intake of foods that have "empty calories." These are foods that have little nutritional value, such as sweets, desserts, candies, and sugar-sweetened beverages.  Eat a variety of foods (especially fruits and vegetables) to get a full range of vitamins and minerals.  Take a prenatal vitamin to help meet your additional vitamin and mineral needs during pregnancy, specifically for folic acid, iron, calcium, and vitamin D.  Remember to stay active. Ask your health care provider what types of exercise and activities are safe for you.  Practice good food safety and cleanliness. Wash your hands before you eat and after you prepare raw  meat. Wash all fruits and vegetables well before peeling or eating. Taking these actions can help to prevent food-borne illnesses that can be very dangerous to your baby, such as listeriosis. Ask your health care provider for more information about listeriosis. What does 150 extra calories look like? Healthy options that provide 150 extra calories each day could be any of the following:  6-8 oz (170-230 g) of plain low-fat yogurt with  cup of berries.  1 apple with 2 teaspoons (11 g) of peanut butter.  Cut-up vegetables with  cup (60 g) of hummus.  8 oz (230 mL) or 1 cup of low-fat chocolate milk.  1 stick of string cheese with 1 medium orange.  1 peanut butter and jelly sandwich that is made with one slice of whole-wheat bread and 1 tsp (5 g) of peanut butter. For 300 extra calories, you could eat two of those healthy options each day. What is a healthy amount of weight to gain? The right amount of weight gain for you is based on your BMI before you became pregnant. If your BMI:  Was less than 18 (underweight), you should gain 28-40 lb (13-18 kg).  Was 18-24.9 (normal), you should gain 25-35 lb (11-16 kg).  Was 25-29.9 (overweight), you should gain 15-25 lb (7-11 kg).  Was 30 or greater (obese), you should gain 11-20  lb (5-9 kg). What if I am having twins or multiples? Generally, if you are carrying twins or multiples:  You may need to eat 300-600 extra calories a day.  The recommended range for total weight gain is 25-54 lb (11-25 kg), depending on your BMI before pregnancy.  Talk with your health care provider to find out about nutritional needs, weight gain, and exercise that is right for you. What foods can I eat?  Fruits All fruits. Eat a variety of colors and types of fruit. Remember to wash your fruits well before peeling or eating. Vegetables All vegetables. Eat a variety of colors and types of vegetables. Remember to wash your vegetables well before peeling or eating. Grains All grains. Choose whole grains, such as whole-wheat bread, oatmeal, or brown rice. Meats and other protein foods Lean meats, including chicken, [place], fish, and lean cuts of beef, veal, or pork. If you eat fish or seafood, choose options that are higher in omega-3 fatty acids and lower in mercury, such as salmon, herring, mussels, trout, sardines, pollock, shrimp, crab, and lobster. Tofu. Tempeh. Beans. Eggs. Peanut butter and other nut butters. Make sure that all meats, poultry, and eggs are cooked to food-safe temperatures or "well-done." Two or more servings of fish are recommended each week in order to get the most benefits from omega-3 fatty acids that are found in seafood. Choose fish that are lower in mercury. You can find more information online:  [employer]Gaming.ch Dairy Pasteurized milk and milk alternatives (such as almond milk). Pasteurized yogurt and pasteurized cheese. Cottage cheese. Sour cream. Beverages Water. Juices that contain 100% fruit juice or vegetable juice. Caffeine-free teas and decaffeinated coffee. Drinks that contain caffeine are okay to drink, but it is better to avoid caffeine. Keep your total caffeine intake to less than 200 mg each day (which is 12 oz or 355 mL of coffee, tea, or soda) or the  limit as told by your health care provider. Fats and oils Fats and oils are okay to include in moderation. Sweets and desserts Sweets and desserts are okay to include in moderation. Seasoning and other foods All pasteurized condiments.  The items listed above may not be a complete list of foods and beverages you can eat. Contact a dietitian for more information. What foods are not recommended? Fruits Unpasteurized fruit juices. Vegetables Raw (unpasteurized) vegetable juices. Meats and other protein foods Lunch meats, bologna, hot dogs, or other deli meats. (If you must eat those meats, reheat them until they are steaming hot.) Refrigerated pat, meat spreads from a meat counter, smoked seafood that is found in the refrigerated section of a store. Raw or undercooked meats, poultry, and eggs. Raw fish, such as sushi or sashimi. Fish that have high mercury content, such as tilefish, shark, swordfish, and king mackerel. To learn more about mercury in fish, talk with your health care provider or look for online resources, such as:  [employer]Gaming.ch Dairy Raw (unpasteurized) milk and any foods that have raw milk in them. Soft cheeses, such as feta, queso blanco, queso fresco, Brie, Camembert cheeses, blue-veined cheeses, and Panela cheese (unless it is made with pasteurized milk, which must be stated on the label). Beverages Alcohol. Sugar-sweetened beverages, such as sodas, teas, or energy drinks. Seasoning and other foods Homemade fermented foods and drinks, such as pickles, sauerkraut, or kombucha drinks. (Store-bought pasteurized versions of these are okay.) Salads that are made in a store or deli, such as ham salad, chicken salad, egg salad, tuna salad, and seafood salad. The items listed above may not be a complete list of foods and beverages you should avoid. Contact a dietitian for more information. Where to find more information To calculate the number of calories you need based on your  height, weight, and activity level, you can use an online calculator such as:  MobileTransition.ch To calculate how much weight you should gain during pregnancy, you can use an online pregnancy weight gain calculator such as:  StreamingFood.com.cy Summary  While you are pregnant, your body requires additional nutrition to help support your growing baby.  Eat a variety of foods, especially fruits and vegetables to get a full range of vitamins and minerals.  Practice good food safety and cleanliness. Wash your hands before you eat and after you prepare raw meat. Wash all fruits and vegetables well before peeling or eating. Taking these actions can help to prevent food-borne illnesses, such as listeriosis, that can be very dangerous to your baby.  Do not eat raw meat or fish. Do not eat fish that have high mercury content, such as tilefish, shark, swordfish, and king mackerel. Do not eat unpasteurized (raw) dairy.  Take a prenatal vitamin to help meet your additional vitamin and mineral needs during pregnancy, specifically for folic acid, iron, calcium, and vitamin D. This information is not intended to replace advice given to you by your health care provider. Make sure you discuss any questions you have with your health care provider. Document Revised: 05/07/2019 Document Reviewed: 09/13/2017 Elsevier Patient Education  [place] Weight Gain During Pregnancy, Adult A certain amount of weight gain during pregnancy is normal and healthy. How much weight you should gain depends on your overall health and a measurement called BMI (body mass index). BMI is an estimate of your body fat based on your height and weight. You can use an Freight forwarder to figure out your BMI, or you can ask your health care provider to calculate it for you at your next visit. Your recommended pregnancy weight gain is based on your pre-pregnancy BMI.  General guidelines for a healthy total weight gain during pregnancy are listed  below. If your BMI at or before the start of your pregnancy is:  Less than 18.5 (underweight), you should gain 28-40 lb (13-18 kg).  18.5-24.9 (normal weight), you should gain 25-35 lb (11-16 kg).  25-29.9 (overweight), you should gain 15-25 lb (7-11 kg).  30 or higher (obese), you should gain 11-20 lb (5-9 kg). These ranges vary depending on your individual health. If you are carrying more than one baby (multiples), it may be safe to gain more weight than these recommendations. If you gain less weight than recommended, that may be safe as long as your baby is growing and developing normally. How can unhealthy weight gain affect me and my baby? Gaining too much weight during pregnancy can lead to pregnancy complications, such as:  A temporary form of diabetes that develops during pregnancy (gestational diabetes).  High blood pressure during pregnancy and protein in your urine (preeclampsia).  High blood pressure during pregnancy without protein in your urine (gestational hypertension).  Your baby having a high weight at birth, which may: ? Raise your risk of having a more difficult delivery or a surgical delivery (cesarean delivery, or C-section). ? Raise your child's risk of developing obesity during childhood. Not gaining enough weight can be life-threatening for your baby, and it may raise your baby's chances of:  Being born early (preterm).  Growing more slowly than normal during pregnancy (growth restriction).  Having a low weight at birth. What actions can I take to gain a healthy amount of weight during pregnancy? General instructions  Keep track of your weight gain during pregnancy.  Take over-the-counter and prescription medicines only as told by your health care provider. Take all prenatal supplements as directed.  Keep all health care visits during pregnancy (prenatal visits). These visits  are a good time to discuss your weight gain. Your health care provider will weigh you at each visit to make sure you are gaining a healthy amount of weight. Nutrition   Eat a balanced, nutrient-rich diet. Eat plenty of: ? Fruits and vegetables, such as berries and broccoli. ? Whole grains, such as millet, barley, whole-wheat breads and cereals, and oatmeal. ? Low-fat dairy products or non-dairy products such as almond milk or rice milk. ? Protein foods, such as lean meat, chicken, eggs, and legumes (such as peas, beans, soybeans, and lentils).  Avoid foods that are fried or have a lot of fat, salt (sodium), or sugar.  Drink enough fluid to keep your urine pale yellow.  Choose healthy snack and drink options when you are at work or on the go: ? Drink water. Avoid soda, sports drinks, and juices that have added sugar. ? Avoid drinks with caffeine, such as coffee and energy drinks. ? Eat snacks that are high in protein, such as nuts, protein bars, and low-fat yogurt. ? Carry convenient snacks in your purse that do not need refrigeration, such as a pack of trail mix, an apple, or a granola bar.  If you need help improving your diet, work with a health care provider or a diet and nutrition specialist (dietitian). Activity   Exercise regularly, as told by your health care provider. ? If you were active before becoming pregnant, you may be able to continue your regular fitness activities. ? If you were not active before pregnancy, you may gradually build up to exercising for 30 or more minutes on most days of the week. This may include walking, swimming, or yoga.  Ask your health care provider what activities are safe  for you. Talk with your health care provider about whether you may need to be excused from certain school or work activities. Where to find more information Learn more about managing your weight gain during pregnancy from:  American Pregnancy Association:  www.americanpregnancy.org  U.[place]. Department of Agriculture pregnancy weight gain calculator: FormerBoss.no Summary  Too much weight gain during pregnancy can lead to complications for you and your baby.  Find out your pre-pregnancy BMI to determine how much weight gain is healthy for you.  Eat nutritious foods and stay active.  Keep all of your prenatal visits as told by your health care provider. This information is not intended to replace advice given to you by your health care provider. Make sure you discuss any questions you have with your health care provider. Document Revised: 09/09/2019 Document Reviewed: 09/06/2017 Elsevier Patient Education  [place].

## 2020-04-26 LAB — OBSTETRIC PANEL, INCLUDING HIV
Basophils Absolute: 0 10*3/uL (ref 0.0–0.2)
Basos: 0 %
EOS (ABSOLUTE): 0.2 10*3/uL (ref 0.0–0.4)
Eos: 4 %
HIV Screen 4th Generation wRfx: NONREACTIVE
Hematocrit: 37.2 % (ref 34.0–46.6)
Hemoglobin: 11.6 g/dL (ref 11.1–15.9)
Hepatitis B Surface Ag: NEGATIVE
Immature Grans (Abs): 0 10*3/uL (ref 0.0–0.1)
Immature Granulocytes: 0 %
Lymphocytes Absolute: 1.2 10*3/uL (ref 0.7–3.1)
Lymphs: 25 %
MCH: 27.8 pg (ref 26.6–33.0)
MCHC: 31.2 g/dL — ABNORMAL LOW (ref 31.5–35.7)
MCV: 89 fL (ref 79–97)
Monocytes Absolute: 0.4 10*3/uL (ref 0.1–0.9)
Monocytes: 9 %
Neutrophils Absolute: 3 10*3/uL (ref 1.4–7.0)
Neutrophils: 62 %
Platelets: 253 10*3/uL (ref 150–450)
RBC: 4.18 x10E6/uL (ref 3.77–5.28)
RDW: 13.7 % (ref 11.7–15.4)
RPR Ser Ql: NONREACTIVE
Rh Factor: POSITIVE
Rubella Antibodies, IGG: 1.59 index (ref >0.99)
WBC: 4.9 10*3/uL (ref 3.4–10.8)

## 2020-04-26 LAB — AB SCR+ANTIBODY ID

## 2020-04-26 LAB — HEMOGLOBIN A1C
Est. average glucose Bld gHb Est-mCnc: 82 mg/dL
Hgb A1c MFr Bld: 4.5 % — ABNORMAL LOW (ref 4.8–5.6)

## 2020-04-27 LAB — CULTURE, OB URINE

## 2020-04-27 LAB — URINE CULTURE, OB REFLEX: Organism ID, Bacteria: NO GROWTH

## 2020-04-27 LAB — CYTOLOGY - PAP
Chlamydia: NEGATIVE
Comment: NEGATIVE
Comment: NORMAL
Diagnosis: HIGH — AB
Neisseria Gonorrhea: NEGATIVE

## 2020-05-09 ENCOUNTER — Encounter: Payer: Self-pay | Admitting: *Deleted

## 2020-05-09 ENCOUNTER — Telehealth: Payer: Self-pay | Admitting: *Deleted

## 2020-05-09 NOTE — Telephone Encounter (Signed)
Received [facility] results. 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  I informed her partner screening recommended and that she can arrange genetic counseling thru [place] and they will explain the results and recommendations and partner testing. I gave her the phone number to call. She voices understanding.  Cayli Escajeda,RN

## 2020-05-11 ENCOUNTER — Encounter: Payer: Self-pay | Admitting: General Practice

## 2020-06-20 ENCOUNTER — Other Ambulatory Visit: Payer: Self-pay

## 2020-06-20 ENCOUNTER — Ambulatory Visit (HOSPITAL_COMMUNITY): Payer: Medicaid Other | Attending: Obstetrics and Gynecology | Admitting: *Deleted

## 2020-06-20 ENCOUNTER — Ambulatory Visit (HOSPITAL_BASED_OUTPATIENT_CLINIC_OR_DEPARTMENT_OTHER): Payer: Medicaid Other

## 2020-06-20 DIAGNOSIS — K5190 Ulcerative colitis, unspecified, without complications: Secondary | ICD-10-CM | POA: Insufficient documentation

## 2020-06-20 DIAGNOSIS — O99419 Diseases of the circulatory system complicating pregnancy, unspecified trimester: Secondary | ICD-10-CM | POA: Insufficient documentation

## 2020-06-20 DIAGNOSIS — Z862 Personal history of diseases of the blood and blood-forming organs and certain disorders involving the immune mechanism: Secondary | ICD-10-CM | POA: Diagnosis not present

## 2020-06-20 DIAGNOSIS — Z148 Genetic carrier of other disease: Secondary | ICD-10-CM

## 2020-06-20 DIAGNOSIS — O99619 Diseases of the digestive system complicating pregnancy, unspecified trimester: Secondary | ICD-10-CM | POA: Insufficient documentation

## 2020-06-20 DIAGNOSIS — O34219 Maternal care for unspecified type scar from previous cesarean delivery: Secondary | ICD-10-CM

## 2020-06-20 DIAGNOSIS — O0990 Supervision of high risk pregnancy, unspecified, unspecified trimester: Secondary | ICD-10-CM | POA: Diagnosis not present

## 2020-06-20 DIAGNOSIS — Z3A00 Weeks of gestation of pregnancy not specified: Secondary | ICD-10-CM | POA: Diagnosis not present

## 2020-06-20 DIAGNOSIS — I499 Cardiac arrhythmia, unspecified: Secondary | ICD-10-CM | POA: Diagnosis not present

## 2020-06-20 DIAGNOSIS — Z363 Encounter for antenatal screening for malformations: Secondary | ICD-10-CM | POA: Diagnosis not present

## 2020-06-20 DIAGNOSIS — Z3A19 19 weeks gestation of pregnancy: Secondary | ICD-10-CM

## 2020-06-20 DIAGNOSIS — Z8619 Personal history of other infectious and parasitic diseases: Secondary | ICD-10-CM | POA: Insufficient documentation

## 2020-06-24 ENCOUNTER — Other Ambulatory Visit: Payer: Self-pay

## 2020-06-24 ENCOUNTER — Encounter: Payer: Self-pay | Admitting: Medical

## 2020-06-24 ENCOUNTER — Ambulatory Visit (INDEPENDENT_AMBULATORY_CARE_PROVIDER_SITE_OTHER): Payer: Medicaid Other | Admitting: Medical

## 2020-06-24 VITALS — BP 120/60 | HR 78 | Wt 180.9 lb

## 2020-06-24 DIAGNOSIS — Z3A19 19 weeks gestation of pregnancy: Secondary | ICD-10-CM

## 2020-06-24 DIAGNOSIS — D573 Sickle-cell trait: Secondary | ICD-10-CM

## 2020-06-24 DIAGNOSIS — Z8619 Personal history of other infectious and parasitic diseases: Secondary | ICD-10-CM

## 2020-06-24 DIAGNOSIS — I499 Cardiac arrhythmia, unspecified: Secondary | ICD-10-CM

## 2020-06-24 DIAGNOSIS — O99891 Other specified diseases and conditions complicating pregnancy: Secondary | ICD-10-CM

## 2020-06-24 DIAGNOSIS — M549 Dorsalgia, unspecified: Secondary | ICD-10-CM

## 2020-06-24 DIAGNOSIS — R87613 High grade squamous intraepithelial lesion on cytologic smear of cervix (HGSIL): Secondary | ICD-10-CM

## 2020-06-24 DIAGNOSIS — O0990 Supervision of high risk pregnancy, unspecified, unspecified trimester: Secondary | ICD-10-CM

## 2020-06-24 DIAGNOSIS — K5190 Ulcerative colitis, unspecified, without complications: Secondary | ICD-10-CM

## 2020-06-24 MED ORDER — COMFORT FIT MATERNITY SUPP LG MISC: 1.0000 [IU] | Freq: Every day | 0 refills | Status: DC | PRN

## 2020-06-24 NOTE — Progress Notes (Signed)
   PRENATAL VISIT NOTE  Subjective:  Carolyn Blake is a 30 y.o. G3P1011 at 53w6dbeing seen today for ongoing prenatal care.  She is currently monitored for the following issues for this high-risk pregnancy and has Anemia; Ulcerative colitis ([place]; History of PCR DNA positive for HSV2; Sickle cell trait ([place]; History of sexually transmitted disease (CT and trich); Supervision of high risk pregnancy, antepartum; Irregular heart beat; and HSIL (high grade squamous intraepithelial lesion) on Pap smear of cervix on their problem list.  Patient reports backache and pelvic pain at work.  Contractions: Not present. Vag. Bleeding: None.  Movement: Present. Denies leaking of fluid.   The following portions of the patient's history were reviewed and updated as appropriate: allergies, current medications, past family history, past medical history, past social history, past surgical history and problem list.   Objective:   Vitals:   06/24/20 0959  BP: 120/60  Pulse: 78  Weight: 180 lb 14.4 oz (82.1 kg)    Fetal Status: Fetal Heart Rate (bpm): 155   Movement: Present     General:  Alert, oriented and cooperative. Patient is in no acute distress.  Skin: Skin is warm and dry. No rash noted.   Cardiovascular: Normal heart rate noted  Respiratory: Normal respiratory effort, no problems with respiration noted  Abdomen: Soft, gravid, appropriate for gestational age.  Pain/Pressure: Absent     Pelvic: Cervical exam deferred        Extremities: Normal range of motion.  Edema: None  Mental Status: Normal mood and affect. Normal behavior. Normal judgment and thought content.   Assessment and Plan:  Pregnancy: G3P1011 at 139w6d. Supervision of high risk pregnancy, antepartum - Plan for repeat C/S  2. HSIL (high grade squamous intraepithelial lesion) on Pap smear of cervix - Results discussed  - Needs colpo at next visit  3. Irregular heart beat  4. Ulcerative colitis without complications,  unspecified location (HCL[place]- No current treatment or flares  5. History of PCR DNA positive for HSV2 - Ppx at 35 weeks   6. Sickle cell trait ([place]- Declined genetic counseling - Partner had testing with [facility], patient will call for results   7. 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  Dispense: 1 each; Refill: 0 - Information for [employer] given   Preterm labor/ second trimester warning symptoms and general obstetric precautions including but not limited to vaginal bleeding, contractions, leaking of fluid and fetal movement were reviewed in detail with the patient. Please refer to After Visit Summary for other counseling recommendations.   [facility] in about 4 weeks (around 07/22/2020) [facility] - needs colpo.  Future Appointments  Date Time Provider DeCleves7/27/2021 10:15 AM Constant, PeVickii ChafeMD [facility]  JuKerry HoughPA-C

## 2020-06-24 NOTE — Patient Instructions (Addendum)
Colposcopy Colposcopy is a procedure to examine the lowest part of the uterus (cervix), the vagina, and the area around the vaginal opening (vulva) for abnormalities or signs of disease. The procedure is done using a lighted microscope or magnifying lens (colposcope). If any unusual cells are found during the procedure, your health care provider may remove a tissue sample for testing (biopsy). A colposcopy may be done if you:  Have an abnormal Pap test. A Pap test is a screening test that is used to check for signs of cancer or infection of the vagina, cervix, and uterus.  Have a Pap smear test in which you test positive for high-risk HPV (human papillomavirus).  Have a sore or lesion on your cervix.  Have genital warts on your vulva, vagina, or cervix.  Took certain medicines while pregnant, such as diethylstilbestrol (DES).  Have pain during sexual intercourse.  Have vaginal bleeding, especially after sexual intercourse.  Need to have a cervical polyp removed.  Need to have a lost intrauterine device (IUD) string located. Let your health care provider know about:  Any allergies you have, including allergies to prescribed medicine, latex, or iodine.  All medicines you are taking, including vitamins, herbs, eye drops, creams, and over-the-counter medicines. Bring a list of all of your medicines to your appointment.  Any problems you or family members have had with anesthetic medicines.  Any blood disorders you have.  Any surgeries you have had.  Any medical conditions you have, such as pelvic inflammatory disease (PID) or endometrial disorder.  Any history of frequent fainting.  Your menstrual cycle and what form of birth control (contraception) you use.  Your medical history, including any prior cervical treatment.  Whether you are pregnant or may be pregnant. What are the risks? Generally, this is a safe procedure. However, problems may occur,  including:  Pain.  Infection, which may include a fever, bad-smelling discharge, or pelvic pain.  Bleeding or discharge.  Misdiagnosis.  Fainting and vasovagal reactions, but this is rare.  Allergic reactions to medicines.  Damage to other structures or organs. What happens before the procedure?  If you have your menstrual period or will have it at the time of your procedure, tell your health care provider. A colposcopy typically is not done during menstruation.  Continue your contraceptive practices before and after the procedure.  For 24 hours before the colposcopy: ? Do not douche. ? Do not use tampons. ? Do not use medicines, creams, or suppositories in the vagina. ? Do not have sexual intercourse.  Ask your health care provider about: ? Changing or stopping your regular medicines. This is especially important if you are taking diabetes medicines or blood thinners. ? Taking medicines such as aspirin and ibuprofen. These medicines can thin your blood. Do not take these medicines before your procedure if your health care provider instructs you not to. It is likely that your health care provider will tell you to avoid taking aspirin or medicine that contains aspirin for 7 days before the procedure.  Follow instructions from your health care provider about eating or drinking restrictions. You will likely need to eat a regular diet the day of the procedure and not skip any meals.  You may have an exam or testing. A pregnancy test will be taken on the day of the procedure.  You may have a blood or urine sample taken.  Plan to have someone take you home from the hospital or clinic.  If you will be going  home right after the procedure, plan to have someone with you for 24 hours. What happens during the procedure?  You will lie down on your back, with your feet in foot rests (stirrups).  A warmed and lubricated instrument (speculum) will be inserted into your vagina. The  speculum will be used to hold apart the walls of your vagina so your health care provider can see your cervix and the inside of your vagina.  A cotton swab will be used to place a small amount of liquid solution on the areas to be examined. This solution makes it easier to see abnormal cells. You may feel a slight burning during this part.  The colposcope will be used to scan the cervix with a bright white light. The colposcope will be held near your vulvaand will magnify your vulva, vagina, and cervix for easier examination.  Your health care provider may decide to take a biopsy. If so: ? You may be given medicine to numb the area (local anesthetic). ? Surgical instruments will be used to suck out mucus and cells through your vagina. ? You may feel mild pain while the tissue sample is removed. ? Bleeding may occur. A solution may be used to stop the bleeding. ? If a sample of tissue is needed from the inside of the cervix, a different procedure called endocervical curettage (ECC) may be completed. During this procedure, a curved instrument (curette) will be used to scrape cells from your cervix or the top of your cervix (endocervix).  Your health care provider will record the location of any abnormalities. The procedure may vary among health care providers and hospitals. What happens after the procedure?  You will lie down and rest for a few minutes. You may be offered juice or cookies.  Your blood pressure, heart rate, breathing rate, and blood oxygen level will be monitored until any medicines you were given have worn off.  You may have to wear compression stockings. These stockings help to prevent blood clots and reduce swelling in your legs.  You may have some cramping in your abdomen. This should go away after a few minutes. This information is not intended to replace advice given to you by your health care provider. Make sure you discuss any questions you have with your health care  provider. Document Revised: 11/29/2017 Document Reviewed: 07/23/2016 Elsevier Patient Education  [place] of Pregnancy  The second trimester is from week 14 through week 27 (month 4 through 6). This is often the time in pregnancy that you feel your best. Often times, morning sickness has lessened or quit. You may have more energy, and you may get hungry more often. Your unborn baby is growing rapidly. At the end of the sixth month, he or she is about 9 inches long and weighs about 1 pounds. You will likely feel the baby move between 18 and 20 weeks of pregnancy. Follow these instructions at home: Medicines  Take over-the-counter and prescription medicines only as told by your doctor. Some medicines are safe and some medicines are not safe during pregnancy.  Take a prenatal vitamin that contains at least 600 micrograms (mcg) of folic acid.  If you have trouble pooping (constipation), take medicine that will make your stool soft (stool softener) if your doctor approves. Eating and drinking   Eat regular, healthy meals.  Avoid raw meat and uncooked cheese.  If you get low calcium from the food you eat, talk to your doctor about taking  a daily calcium supplement.  Avoid foods that are high in fat and sugars, such as fried and sweet foods.  If you feel sick to your stomach (nauseous) or throw up (vomit): ? Eat 4 or 5 small meals a day instead of 3 large meals. ? Try eating a few soda crackers. ? Drink liquids between meals instead of during meals.  To prevent constipation: ? Eat foods that are high in fiber, like fresh fruits and vegetables, whole grains, and beans. ? Drink enough fluids to keep your pee (urine) clear or pale yellow. Activity  Exercise only as told by your doctor. Stop exercising if you start to have cramps.  Do not exercise if it is too hot, too humid, or if you are in a place of great height (high altitude).  Avoid heavy  lifting.  Wear low-heeled shoes. Sit and stand up straight.  You can continue to have sex unless your doctor tells you not to. Relieving pain and discomfort  Wear a good support bra if your breasts are tender.  Take warm water baths (sitz baths) to soothe pain or discomfort caused by hemorrhoids. Use hemorrhoid cream if your doctor approves.  Rest with your legs raised if you have leg cramps or low back pain.  If you develop puffy, bulging veins (varicose veins) in your legs: ? Wear support hose or compression stockings as told by your doctor. ? Raise (elevate) your feet for 15 minutes, 3-4 times a day. ? Limit salt in your food. Prenatal care  Write down your questions. Take them to your prenatal visits.  Keep all your prenatal visits as told by your doctor. This is important. Safety  Wear your seat belt when driving.  Make a list of emergency phone numbers, including numbers for family, friends, the hospital, and police and fire departments. General instructions  Ask your doctor about the right foods to eat or for help finding a counselor, if you need these services.  Ask your doctor about local prenatal classes. Begin classes before month 6 of your pregnancy.  Do not use hot tubs, steam rooms, or saunas.  Do not douche or use tampons or scented sanitary pads.  Do not cross your legs for long periods of time.  Visit your dentist if you have not done so. Use a soft toothbrush to brush your teeth. Floss gently.  Avoid all smoking, herbs, and alcohol. Avoid drugs that are not approved by your doctor.  Do not use any products that contain nicotine or tobacco, such as cigarettes and e-cigarettes. If you need help quitting, ask your doctor.  Avoid cat litter boxes and soil used by cats. These carry germs that can cause birth defects in the baby and can cause a loss of your baby (miscarriage) or stillbirth. Contact a doctor if:  You have mild cramps or pressure in your  lower belly.  You have pain when you pee (urinate).  You have bad smelling fluid coming from your vagina.  You continue to feel sick to your stomach (nauseous), throw up (vomit), or have watery poop (diarrhea).  You have a nagging pain in your belly area.  You feel dizzy. Get help right away if:  You have a fever.  You are leaking fluid from your vagina.  You have spotting or bleeding from your vagina.  You have severe belly cramping or pain.  You lose or gain weight rapidly.  You have trouble catching your breath and have chest pain.  You notice sudden  or extreme puffiness (swelling) of your face, hands, ankles, feet, or legs.  You have not felt the baby move in over an hour.  You have severe headaches that do not go away when you take medicine.  You have trouble seeing. Summary  The second trimester is from week 14 through week 27 (months 4 through 6). This is often the time in pregnancy that you feel your best.  To take care of yourself and your unborn baby, you will need to eat healthy meals, take medicines only if your doctor tells you to do so, and do activities that are safe for you and your baby.  Call your doctor if you get sick or if you notice anything unusual about your pregnancy. Also, call your doctor if you need help with the right food to eat, or if you want to know what activities are safe for you. This information is not intended to replace advice given to you by your health care provider. Make sure you discuss any questions you have with your health care provider. Document Revised: 04/10/2019 Document Reviewed: 01/22/2017 Elsevier Patient Education  [address], [place], [place] [address] Phone: [phone]  Monday     8:30AM-5PM Tuesday 8:30AM-5PM Wednesday 8:30AM-5PM Thursday 8:30AM-5PM Friday  8:30AM-5PM Saturday Closed Sunday Closed

## 2020-07-22 ENCOUNTER — Other Ambulatory Visit: Payer: Self-pay | Admitting: Obstetrics and Gynecology

## 2020-07-22 ENCOUNTER — Telehealth (INDEPENDENT_AMBULATORY_CARE_PROVIDER_SITE_OTHER): Payer: Medicaid Other | Admitting: Lactation Services

## 2020-07-22 DIAGNOSIS — O285 Abnormal chromosomal and genetic finding on antenatal screening of mother: Secondary | ICD-10-CM

## 2020-07-22 DIAGNOSIS — Z148 Genetic carrier of other disease: Secondary | ICD-10-CM

## 2020-07-22 DIAGNOSIS — O0990 Supervision of high risk pregnancy, unspecified, unspecified trimester: Secondary | ICD-10-CM

## 2020-07-22 NOTE — Telephone Encounter (Signed)
[facility] + for [employer]rier for Alpha Thalassemia.   Called [facility] and she reports she has not had any Genetic Counseling and would like to do through MFM now that FOB testing haas returned.   Will route to [employer] for recommendation.

## 2020-07-26 ENCOUNTER — Ambulatory Visit (INDEPENDENT_AMBULATORY_CARE_PROVIDER_SITE_OTHER): Payer: Medicaid Other | Admitting: Obstetrics and Gynecology

## 2020-07-26 ENCOUNTER — Encounter: Payer: Self-pay | Admitting: Obstetrics and Gynecology

## 2020-07-26 ENCOUNTER — Other Ambulatory Visit: Payer: Self-pay

## 2020-07-26 VITALS — BP 126/60 | HR 74 | Wt 187.6 lb

## 2020-07-26 DIAGNOSIS — R87613 High grade squamous intraepithelial lesion on cytologic smear of cervix (HGSIL): Secondary | ICD-10-CM

## 2020-07-26 DIAGNOSIS — K5190 Ulcerative colitis, unspecified, without complications: Secondary | ICD-10-CM

## 2020-07-26 DIAGNOSIS — O0990 Supervision of high risk pregnancy, unspecified, unspecified trimester: Secondary | ICD-10-CM

## 2020-07-26 DIAGNOSIS — Z3A24 24 weeks gestation of pregnancy: Secondary | ICD-10-CM | POA: Diagnosis not present

## 2020-07-26 DIAGNOSIS — O99612 Diseases of the digestive system complicating pregnancy, second trimester: Secondary | ICD-10-CM

## 2020-07-26 DIAGNOSIS — O3442 Maternal care for other abnormalities of cervix, second trimester: Secondary | ICD-10-CM

## 2020-07-26 DIAGNOSIS — O0992 Supervision of high risk pregnancy, unspecified, second trimester: Secondary | ICD-10-CM

## 2020-07-26 NOTE — Progress Notes (Signed)
Patient given informed consent, signed copy in the chart, time out was performed.  Placed in lithotomy position. Cervix viewed with speculum and colposcope after application of acetic acid.   Colposcopy adequate?  yes Acetowhite lesions? Yes 10-12 and 5-7 o'clock Punctation? no Mosaicism?  no Abnormal vasculature?  no Biopsies? no ECC? no  COMMENTS:  Patient was given post procedure instructions.  Repeat colpo with biopsies postpartum   Mora Bellman, MD

## 2020-07-26 NOTE — Progress Notes (Signed)
° °  PRENATAL VISIT NOTE  Subjective:  Carolyn Blake is a 30 y.o. G3P1011 at 34w3dbeing seen today for ongoing prenatal care.  She is currently monitored for the following issues for this high-risk pregnancy and has Anemia; Ulcerative colitis ([place]; History of PCR DNA positive for HSV2; Sickle cell trait ([place]; History of sexually transmitted disease (CT and trich); Supervision of high risk pregnancy, antepartum; Irregular heart beat; and HSIL (high grade squamous intraepithelial lesion) on Pap smear of cervix on their problem list.  Patient reports no complaints.  Contractions: Not present. Vag. Bleeding: None.  Movement: Present. Denies leaking of fluid.   The following portions of the patient's history were reviewed and updated as appropriate: allergies, current medications, past family history, past medical history, past social history, past surgical history and problem list.   Objective:   Vitals:   07/26/20 1025  BP: (!) 126/60  Pulse: 74  Weight: 187 lb 9.6 oz (85.1 kg)    Fetal Status: Fetal Heart Rate (bpm): 150 Fundal Height: 24 cm Movement: Present     General:  Alert, oriented and cooperative. Patient is in no acute distress.  Skin: Skin is warm and dry. No rash noted.   Cardiovascular: Normal heart rate noted  Respiratory: Normal respiratory effort, no problems with respiration noted  Abdomen: Soft, gravid, appropriate for gestational age.  Pain/Pressure: Absent     Pelvic: Cervical exam deferred        Extremities: Normal range of motion.  Edema: None  Mental Status: Normal mood and affect. Normal behavior. Normal judgment and thought content.   Assessment and Plan:  Pregnancy: G3P1011 at 289w3d. Supervision of high risk pregnancy, antepartum Patient is doing well without complaints Third trimester labs next visit  2. HSIL (high grade squamous intraepithelial lesion) on Pap smear of cervix colpo today  3. Ulcerative colitis without complications, unspecified  location ([place]St[place]  Preterm labor symptoms and general obstetric precautions including but not limited to vaginal bleeding, contractions, leaking of fluid and fetal movement were reviewed in detail with the patient. Please refer to After Visit Summary for other counseling recommendations.   Return in about 4 weeks (around 08/23/2020) for in person, ROB, 2 hr glucola next visit.  No future appointments.  PeMora BellmanMD

## 2020-08-24 ENCOUNTER — Other Ambulatory Visit: Payer: Self-pay | Admitting: *Deleted

## 2020-08-24 DIAGNOSIS — O0990 Supervision of high risk pregnancy, unspecified, unspecified trimester: Secondary | ICD-10-CM

## 2020-08-25 ENCOUNTER — Other Ambulatory Visit (INDEPENDENT_AMBULATORY_CARE_PROVIDER_SITE_OTHER): Payer: Medicaid Other

## 2020-08-25 ENCOUNTER — Encounter: Payer: Self-pay | Admitting: Obstetrics and Gynecology

## 2020-08-25 ENCOUNTER — Ambulatory Visit (INDEPENDENT_AMBULATORY_CARE_PROVIDER_SITE_OTHER): Payer: Medicaid Other | Admitting: Obstetrics and Gynecology

## 2020-08-25 ENCOUNTER — Other Ambulatory Visit: Payer: Self-pay

## 2020-08-25 VITALS — BP 116/72 | HR 91 | Wt 191.9 lb

## 2020-08-25 DIAGNOSIS — Z23 Encounter for immunization: Secondary | ICD-10-CM | POA: Diagnosis not present

## 2020-08-25 DIAGNOSIS — Z8619 Personal history of other infectious and parasitic diseases: Secondary | ICD-10-CM

## 2020-08-25 DIAGNOSIS — K5190 Ulcerative colitis, unspecified, without complications: Secondary | ICD-10-CM

## 2020-08-25 DIAGNOSIS — Z98891 History of uterine scar from previous surgery: Secondary | ICD-10-CM

## 2020-08-25 DIAGNOSIS — D573 Sickle-cell trait: Secondary | ICD-10-CM

## 2020-08-25 DIAGNOSIS — L309 Dermatitis, unspecified: Secondary | ICD-10-CM

## 2020-08-25 DIAGNOSIS — R87613 High grade squamous intraepithelial lesion on cytologic smear of cervix (HGSIL): Secondary | ICD-10-CM

## 2020-08-25 DIAGNOSIS — O0990 Supervision of high risk pregnancy, unspecified, unspecified trimester: Secondary | ICD-10-CM

## 2020-08-25 LAB — POCT URINALYSIS DIP (DEVICE)
Bilirubin Urine: NEGATIVE
Glucose, UA: NEGATIVE mg/dL
Hgb urine dipstick: NEGATIVE
Ketones, ur: NEGATIVE mg/dL
Leukocytes,Ua: NEGATIVE
Nitrite: NEGATIVE
Protein, ur: NEGATIVE mg/dL
Specific Gravity, Urine: 1.025 (ref 1.005–1.030)
Urobilinogen, UA: 0.2 mg/dL (ref 0.0–1.0)
pH: 5.5 (ref 5.0–8.0)

## 2020-08-25 NOTE — Patient Instructions (Signed)
Third Trimester of Pregnancy The third trimester is from week 28 through week 40 (months 7 through 9). The third trimester is a time when the unborn baby (fetus) is growing rapidly. At the end of the ninth month, the fetus is about 20 inches in length and weighs 6-10 pounds. Body changes during your third trimester Your body will continue to go through many changes during pregnancy. The changes vary from woman to woman. During the third trimester:  Your weight will continue to increase. You can expect to gain 25-35 pounds (11-16 kg) by the end of the pregnancy.  You may begin to get stretch marks on your hips, abdomen, and breasts.  You may urinate more often because the fetus is moving lower into your pelvis and pressing on your bladder.  You may develop or continue to have heartburn. This is caused by increased hormones that slow down muscles in the digestive tract.  You may develop or continue to have constipation because increased hormones slow digestion and cause the muscles that push waste through your intestines to relax.  You may develop hemorrhoids. These are swollen veins (varicose veins) in the rectum that can itch or be painful.  You may develop swollen, bulging veins (varicose veins) in your legs.  You may have increased body aches in the pelvis, back, or thighs. This is due to weight gain and increased hormones that are relaxing your joints.  You may have changes in your hair. These can include thickening of your hair, rapid growth, and changes in texture. Some women also have hair loss during or after pregnancy, or hair that feels dry or thin. Your hair will most likely return to normal after your baby is born.  Your breasts will continue to grow and they will continue to become tender. A yellow fluid (colostrum) may leak from your breasts. This is the first milk you are producing for your baby.  Your belly button may stick out.  You may notice more swelling in your hands,  face, or ankles.  You may have increased tingling or numbness in your hands, arms, and legs. The skin on your belly may also feel numb.  You may feel short of breath because of your expanding uterus.  You may have more problems sleeping. This can be caused by the size of your belly, increased need to urinate, and an increase in your body's metabolism.  You may notice the fetus "dropping," or moving lower in your abdomen (lightening).  You may have increased vaginal discharge.  You may notice your joints feel loose and you may have pain around your pelvic bone. What to expect at prenatal visits You will have prenatal exams every 2 weeks until week 36. Then you will have weekly prenatal exams. During a routine prenatal visit:  You will be weighed to make sure you and the baby are growing normally.  Your blood pressure will be taken.  Your abdomen will be measured to track your baby's growth.  The fetal heartbeat will be listened to.  Any test results from the previous visit will be discussed.  You may have a cervical check near your due date to see if your cervix has softened or thinned (effaced).  You will be tested for Group B streptococcus. This happens between 35 and 37 weeks. Your health care provider may ask you:  What your birth plan is.  How you are feeling.  If you are feeling the baby move.  If you have had any abnormal  symptoms, such as leaking fluid, bleeding, severe headaches, or abdominal cramping.  If you are using any tobacco products, including cigarettes, chewing tobacco, and electronic cigarettes.  If you have any questions. Other tests or screenings that may be performed during your third trimester include:  Blood tests that check for low iron levels (anemia).  Fetal testing to check the health, activity level, and growth of the fetus. Testing is done if you have certain medical conditions or if there are problems during the pregnancy.  Nonstress test  (NST). This test checks the health of your baby to make sure there are no signs of problems, such as the baby not getting enough oxygen. During this test, a belt is placed around your belly. The baby is made to move, and its heart rate is monitored during movement. What is false labor? False labor is a condition in which you feel small, irregular tightenings of the muscles in the womb (contractions) that usually go away with rest, changing position, or drinking water. These are called Braxton Hicks contractions. Contractions may last for hours, days, or even weeks before true labor sets in. If contractions come at regular intervals, become more frequent, increase in intensity, or become painful, you should see your health care provider. What are the signs of labor?  Abdominal cramps.  Regular contractions that start at 10 minutes apart and become stronger and more frequent with time.  Contractions that start on the top of the uterus and spread down to the lower abdomen and back.  Increased pelvic pressure and dull back pain.  A watery or bloody mucus discharge that comes from the vagina.  Leaking of amniotic fluid. This is also known as your "water breaking." It could be a slow trickle or a gush. Let your health care provider know if it has a color or strange odor. If you have any of these signs, call your health care provider right away, even if it is before your due date. Follow these instructions at home: Medicines  Follow your health care provider's instructions regarding medicine use. Specific medicines may be either safe or unsafe to take during pregnancy.  Take a prenatal vitamin that contains at least 600 micrograms (mcg) of folic acid.  If you develop constipation, try taking a stool softener if your health care provider approves. Eating and drinking   Eat a balanced diet that includes fresh fruits and vegetables, whole grains, good sources of protein such as meat, eggs, or tofu,  and low-fat dairy. Your health care provider will help you determine the amount of weight gain that is right for you.  Avoid raw meat and uncooked cheese. These carry germs that can cause birth defects in the baby.  If you have low calcium intake from food, talk to your health care provider about whether you should take a daily calcium supplement.  Eat four or five small meals rather than three large meals a day.  Limit foods that are high in fat and processed sugars, such as fried and sweet foods.  To prevent constipation: ? Drink enough fluid to keep your urine clear or pale yellow. ? Eat foods that are high in fiber, such as fresh fruits and vegetables, whole grains, and beans. Activity  Exercise only as directed by your health care provider. Most women can continue their usual exercise routine during pregnancy. Try to exercise for 30 minutes at least 5 days a week. Stop exercising if you experience uterine contractions.  Avoid heavy lifting.  Do  not exercise in extreme heat or humidity, or at high altitudes.  Wear low-heel, comfortable shoes.  Practice good posture.  You may continue to have sex unless your health care provider tells you otherwise. Relieving pain and discomfort  Take frequent breaks and rest with your legs elevated if you have leg cramps or low back pain.  Take warm sitz baths to soothe any pain or discomfort caused by hemorrhoids. Use hemorrhoid cream if your health care provider approves.  Wear a good support bra to prevent discomfort from breast tenderness.  If you develop varicose veins: ? Wear support pantyhose or compression stockings as told by your healthcare provider. ? Elevate your feet for 15 minutes, 3-4 times a day. Prenatal care  Write down your questions. Take them to your prenatal visits.  Keep all your prenatal visits as told by your health care provider. This is important. Safety  Wear your seat belt at all times when driving.  Make  a list of emergency phone numbers, including numbers for family, friends, the hospital, and police and fire departments. General instructions  Avoid cat litter boxes and soil used by cats. These carry germs that can cause birth defects in the baby. If you have a cat, ask someone to clean the litter box for you.  Do not travel far distances unless it is absolutely necessary and only with the approval of your health care provider.  Do not use hot tubs, steam rooms, or saunas.  Do not drink alcohol.  Do not use any products that contain nicotine or tobacco, such as cigarettes and e-cigarettes. If you need help quitting, ask your health care provider.  Do not use any medicinal herbs or unprescribed drugs. These chemicals affect the formation and growth of the baby.  Do not douche or use tampons or scented sanitary pads.  Do not cross your legs for long periods of time.  To prepare for the arrival of your baby: ? Take prenatal classes to understand, practice, and ask questions about labor and delivery. ? Make a trial run to the hospital. ? Visit the hospital and tour the maternity area. ? Arrange for maternity or paternity leave through employers. ? Arrange for family and friends to take care of pets while you are in the hospital. ? Purchase a rear-facing car seat and make sure you know how to install it in your car. ? Pack your hospital bag. ? Prepare the baby's nursery. Make sure to remove all pillows and stuffed animals from the baby's crib to prevent suffocation.  Visit your dentist if you have not gone during your pregnancy. Use a soft toothbrush to brush your teeth and be gentle when you floss. Contact a health care provider if:  You are unsure if you are in labor or if your water has broken.  You become dizzy.  You have mild pelvic cramps, pelvic pressure, or nagging pain in your abdominal area.  You have lower back pain.  You have persistent nausea, vomiting, or  diarrhea.  You have an unusual or bad smelling vaginal discharge.  You have pain when you urinate. Get help right away if:  Your water breaks before 37 weeks.  You have regular contractions less than 5 minutes apart before 37 weeks.  You have a fever.  You are leaking fluid from your vagina.  You have spotting or bleeding from your vagina.  You have severe abdominal pain or cramping.  You have rapid weight loss or weight gain.  You have  shortness of breath with chest pain.  You notice sudden or extreme swelling of your face, hands, ankles, feet, or legs.  Your baby makes fewer than 10 movements in 2 hours.  You have severe headaches that do not go away when you take medicine.  You have vision changes. Summary  The third trimester is from week 28 through week 40, months 7 through 9. The third trimester is a time when the unborn baby (fetus) is growing rapidly.  During the third trimester, your discomfort may increase as you and your baby continue to gain weight. You may have abdominal, leg, and back pain, sleeping problems, and an increased need to urinate.  During the third trimester your breasts will keep growing and they will continue to become tender. A yellow fluid (colostrum) may leak from your breasts. This is the first milk you are producing for your baby.  False labor is a condition in which you feel small, irregular tightenings of the muscles in the womb (contractions) that eventually go away. These are called Braxton Hicks contractions. Contractions may last for hours, days, or even weeks before true labor sets in.  Signs of labor can include: abdominal cramps; regular contractions that start at 10 minutes apart and become stronger and more frequent with time; watery or bloody mucus discharge that comes from the vagina; increased pelvic pressure and dull back pain; and leaking of amniotic fluid. This information is not intended to replace advice given to you by your  health care provider. Make sure you discuss any questions you have with your health care provider. Document Revised: 04/09/2019 Document Reviewed: 01/22/2017 [employer] Patient Education  [employer].

## 2020-08-25 NOTE — Progress Notes (Signed)
Subjective:  Carolyn Blake is a 30 y.o. G3P1011 at 14w5dbeing seen today for ongoing prenatal care.  She is currently monitored for the following issues for this high-risk pregnancy and has Anemia; Ulcerative colitis ([place]; History of PCR DNA positive for HSV2; Sickle cell trait ([place]; History of sexually transmitted disease (CT and trich); Supervision of high risk pregnancy, antepartum; Irregular heart beat; HSIL (high grade squamous intraepithelial lesion) on Pap smear of cervix; and H/O: cesarean section on their problem list.  Patient reports ezcema.  Contractions: Not present. Vag. Bleeding: None.  Movement: Present. Denies leaking of fluid.   The following portions of the patient's history were reviewed and updated as appropriate: allergies, current medications, past family history, past medical history, past social history, past surgical history and problem list. Problem list updated.  Objective:   Vitals:   08/25/20 0839  BP: 116/72  Pulse: 91  Weight: 87 kg    Fetal Status: Fetal Heart Rate (bpm): 138   Movement: Present     General:  Alert, oriented and cooperative. Patient is in no acute distress.  Skin: Skin is warm and dry. No rash noted.   Cardiovascular: Normal heart rate noted  Respiratory: Normal respiratory effort, no problems with respiration noted  Abdomen: Soft, gravid, appropriate for gestational age. Pain/Pressure: Absent     Pelvic:  Cervical exam deferred        Extremities: Normal range of motion.  Edema: None  Mental Status: Normal mood and affect. Normal behavior. Normal judgment and thought content.   Urinalysis:      Assessment and Plan:  Pregnancy: G3P1011 at 227w5d1. Supervision of high risk pregnancy, antepartum Stable 28 week labs today  2. Ulcerative colitis without complications, unspecified location (HCC) Stable Followed by GI  3. HSIL (high grade squamous intraepithelial lesion) on Pap smear of cervix S/P colpo  4. Sickle cell  trait (HCC)  - Culture, OB Urine  5. History of PCR DNA positive for HSV2 PP at 36 weeks  6. H/O: cesarean section Desires repeat  Preterm labor symptoms and general obstetric precautions including but not limited to vaginal bleeding, contractions, leaking of fluid and fetal movement were reviewed in detail with the patient. Please refer to After Visit Summary for other counseling recommendations.  Return in about 2 weeks (around 09/08/2020) for virtual, OB visit.   ErChancy MilroyMD

## 2020-08-26 ENCOUNTER — Encounter: Payer: Self-pay | Admitting: *Deleted

## 2020-08-26 LAB — CBC
Hematocrit: 31.6 % — ABNORMAL LOW (ref 34.0–46.6)
Hemoglobin: 10.2 g/dL — ABNORMAL LOW (ref 11.1–15.9)
MCH: 28.1 pg (ref 26.6–33.0)
MCHC: 32.3 g/dL (ref 31.5–35.7)
MCV: 87 fL (ref 79–97)
Platelets: 219 10*3/uL (ref 150–450)
RBC: 3.63 x10E6/uL — ABNORMAL LOW (ref 3.77–5.28)
RDW: 12.7 % (ref 11.7–15.4)
WBC: 5.6 10*3/uL (ref 3.4–10.8)

## 2020-08-26 LAB — HIV ANTIBODY (ROUTINE TESTING W REFLEX): HIV Screen 4th Generation wRfx: NONREACTIVE

## 2020-08-26 LAB — GLUCOSE TOLERANCE, 2 HOURS W/ 1HR
Glucose, 1 hour: 105 mg/dL (ref 65–179)
Glucose, 2 hour: 91 mg/dL (ref 65–152)
Glucose, Fasting: 67 mg/dL (ref 65–91)

## 2020-08-26 LAB — RPR: RPR Ser Ql: NONREACTIVE

## 2020-08-27 LAB — CULTURE, OB URINE

## 2020-08-27 LAB — URINE CULTURE, OB REFLEX

## 2020-09-12 ENCOUNTER — Other Ambulatory Visit: Payer: Self-pay

## 2020-09-12 ENCOUNTER — Ambulatory Visit (INDEPENDENT_AMBULATORY_CARE_PROVIDER_SITE_OTHER): Payer: Medicaid Other | Admitting: Obstetrics and Gynecology

## 2020-09-12 ENCOUNTER — Encounter: Payer: Self-pay | Admitting: Obstetrics and Gynecology

## 2020-09-12 VITALS — BP 123/63 | HR 92 | Wt 194.8 lb

## 2020-09-12 DIAGNOSIS — Z23 Encounter for immunization: Secondary | ICD-10-CM

## 2020-09-12 DIAGNOSIS — R87613 High grade squamous intraepithelial lesion on cytologic smear of cervix (HGSIL): Secondary | ICD-10-CM

## 2020-09-12 DIAGNOSIS — K5190 Ulcerative colitis, unspecified, without complications: Secondary | ICD-10-CM

## 2020-09-12 DIAGNOSIS — O0990 Supervision of high risk pregnancy, unspecified, unspecified trimester: Secondary | ICD-10-CM | POA: Diagnosis not present

## 2020-09-12 DIAGNOSIS — Z8619 Personal history of other infectious and parasitic diseases: Secondary | ICD-10-CM

## 2020-09-12 DIAGNOSIS — D573 Sickle-cell trait: Secondary | ICD-10-CM

## 2020-09-12 DIAGNOSIS — Z98891 History of uterine scar from previous surgery: Secondary | ICD-10-CM

## 2020-09-12 NOTE — Progress Notes (Signed)
Subjective:  Carolyn Blake is a 30 y.o. G3P1011 at 14w2dbeing seen today for ongoing prenatal care.  She is currently monitored for the following issues for this high-risk pregnancy and has Anemia; Ulcerative colitis (Carolyn Blake; History of PCR DNA positive for HSV2; Sickle cell trait ([place]; History of sexually transmitted disease (CT and trich); Supervision of high risk pregnancy, antepartum; Irregular heart beat; HSIL (high grade squamous intraepithelial lesion) on Pap smear of cervix; and H/O: cesarean section on their problem list.  Patient reports no complaints.  Contractions: Not present. Vag. Bleeding: None.  Movement: Present. Denies leaking of fluid.   The following portions of the patient's history were reviewed and updated as appropriate: allergies, current medications, past family history, past medical history, past social history, past surgical history and problem list. Problem list updated.  Objective:   Vitals:   09/12/20 1435  BP: 123/63  Pulse: 92  Weight: 194 lb 12.8 oz (88.4 kg)    Fetal Status: Fetal Heart Rate (bpm): 141   Movement: Present     General:  Alert, oriented and cooperative. Patient is in no acute distress.  Skin: Skin is warm and dry. No rash noted.   Cardiovascular: Normal heart rate noted  Respiratory: Normal respiratory effort, no problems with respiration noted  Abdomen: Soft, gravid, appropriate for gestational age. Pain/Pressure: Absent     Pelvic:  Cervical exam deferred        Extremities: Normal range of motion.  Edema: None  Mental Status: Normal mood and affect. Normal behavior. Normal judgment and thought content.   Urinalysis:      Assessment and Plan:  Pregnancy: G3P1011 at 384w2d1. Supervision of high risk pregnancy, antepartum Stable - Flu Vaccine QUAD 36+ mos IM  2. Sickle cell trait (HCC) UC last month negative  3. History of PCR DNA positive for HSV2 Valtrex at 36 weeks  4. Ulcerative colitis without complications,  unspecified location (HCC) Stable Followed by GI  5. H/O: cesarean section Repeat at 39 weeks Note to schedule sent  6. HSIL (high grade squamous intraepithelial lesion) on Pap smear of cervix S/P colpo  Preterm labor symptoms and general obstetric precautions including but not limited to vaginal bleeding, contractions, leaking of fluid and fetal movement were reviewed in detail with the patient. Please refer to After Visit Summary for other counseling recommendations.  Return for OB visit, face to face, MD only.   Carolyn MilroyMD

## 2020-09-12 NOTE — Patient Instructions (Signed)
Third Trimester of Pregnancy The third trimester is from week 28 through week 40 (months 7 through 9). The third trimester is a time when the unborn baby (fetus) is growing rapidly. At the end of the ninth month, the fetus is about 20 inches in length and weighs 6-10 pounds. Body changes during your third trimester Your body will continue to go through many changes during pregnancy. The changes vary from woman to woman. During the third trimester:  Your weight will continue to increase. You can expect to gain 25-35 pounds (11-16 kg) by the end of the pregnancy.  You may begin to get stretch marks on your hips, abdomen, and breasts.  You may urinate more often because the fetus is moving lower into your pelvis and pressing on your bladder.  You may develop or continue to have heartburn. This is caused by increased hormones that slow down muscles in the digestive tract.  You may develop or continue to have constipation because increased hormones slow digestion and cause the muscles that push waste through your intestines to relax.  You may develop hemorrhoids. These are swollen veins (varicose veins) in the rectum that can itch or be painful.  You may develop swollen, bulging veins (varicose veins) in your legs.  You may have increased body aches in the pelvis, back, or thighs. This is due to weight gain and increased hormones that are relaxing your joints.  You may have changes in your hair. These can include thickening of your hair, rapid growth, and changes in texture. Some women also have hair loss during or after pregnancy, or hair that feels dry or thin. Your hair will most likely return to normal after your baby is born.  Your breasts will continue to grow and they will continue to become tender. A yellow fluid (colostrum) may leak from your breasts. This is the first milk you are producing for your baby.  Your belly button may stick out.  You may notice more swelling in your hands,  face, or ankles.  You may have increased tingling or numbness in your hands, arms, and legs. The skin on your belly may also feel numb.  You may feel short of breath because of your expanding uterus.  You may have more problems sleeping. This can be caused by the size of your belly, increased need to urinate, and an increase in your body's metabolism.  You may notice the fetus "dropping," or moving lower in your abdomen (lightening).  You may have increased vaginal discharge.  You may notice your joints feel loose and you may have pain around your pelvic bone. What to expect at prenatal visits You will have prenatal exams every 2 weeks until week 36. Then you will have weekly prenatal exams. During a routine prenatal visit:  You will be weighed to make sure you and the baby are growing normally.  Your blood pressure will be taken.  Your abdomen will be measured to track your baby's growth.  The fetal heartbeat will be listened to.  Any test results from the previous visit will be discussed.  You may have a cervical check near your due date to see if your cervix has softened or thinned (effaced).  You will be tested for Group B streptococcus. This happens between 35 and 37 weeks. Your health care provider may ask you:  What your birth plan is.  How you are feeling.  If you are feeling the baby move.  If you have had any abnormal  symptoms, such as leaking fluid, bleeding, severe headaches, or abdominal cramping.  If you are using any tobacco products, including cigarettes, chewing tobacco, and electronic cigarettes.  If you have any questions. Other tests or screenings that may be performed during your third trimester include:  Blood tests that check for low iron levels (anemia).  Fetal testing to check the health, activity level, and growth of the fetus. Testing is done if you have certain medical conditions or if there are problems during the pregnancy.  Nonstress test  (NST). This test checks the health of your baby to make sure there are no signs of problems, such as the baby not getting enough oxygen. During this test, a belt is placed around your belly. The baby is made to move, and its heart rate is monitored during movement. What is false labor? False labor is a condition in which you feel small, irregular tightenings of the muscles in the womb (contractions) that usually go away with rest, changing position, or drinking water. These are called Braxton Hicks contractions. Contractions may last for hours, days, or even weeks before true labor sets in. If contractions come at regular intervals, become more frequent, increase in intensity, or become painful, you should see your health care provider. What are the signs of labor?  Abdominal cramps.  Regular contractions that start at 10 minutes apart and become stronger and more frequent with time.  Contractions that start on the top of the uterus and spread down to the lower abdomen and back.  Increased pelvic pressure and dull back pain.  A watery or bloody mucus discharge that comes from the vagina.  Leaking of amniotic fluid. This is also known as your "water breaking." It could be a slow trickle or a gush. Let your health care provider know if it has a color or strange odor. If you have any of these signs, call your health care provider right away, even if it is before your due date. Follow these instructions at home: Medicines  Follow your health care provider's instructions regarding medicine use. Specific medicines may be either safe or unsafe to take during pregnancy.  Take a prenatal vitamin that contains at least 600 micrograms (mcg) of folic acid.  If you develop constipation, try taking a stool softener if your health care provider approves. Eating and drinking   Eat a balanced diet that includes fresh fruits and vegetables, whole grains, good sources of protein such as meat, eggs, or tofu,  and low-fat dairy. Your health care provider will help you determine the amount of weight gain that is right for you.  Avoid raw meat and uncooked cheese. These carry germs that can cause birth defects in the baby.  If you have low calcium intake from food, talk to your health care provider about whether you should take a daily calcium supplement.  Eat four or five small meals rather than three large meals a day.  Limit foods that are high in fat and processed sugars, such as fried and sweet foods.  To prevent constipation: ? Drink enough fluid to keep your urine clear or pale yellow. ? Eat foods that are high in fiber, such as fresh fruits and vegetables, whole grains, and beans. Activity  Exercise only as directed by your health care provider. Most women can continue their usual exercise routine during pregnancy. Try to exercise for 30 minutes at least 5 days a week. Stop exercising if you experience uterine contractions.  Avoid heavy lifting.  Do  not exercise in extreme heat or humidity, or at high altitudes.  Wear low-heel, comfortable shoes.  Practice good posture.  You may continue to have sex unless your health care provider tells you otherwise. Relieving pain and discomfort  Take frequent breaks and rest with your legs elevated if you have leg cramps or low back pain.  Take warm sitz baths to soothe any pain or discomfort caused by hemorrhoids. Use hemorrhoid cream if your health care provider approves.  Wear a good support bra to prevent discomfort from breast tenderness.  If you develop varicose veins: ? Wear support pantyhose or compression stockings as told by your healthcare provider. ? Elevate your feet for 15 minutes, 3-4 times a day. Prenatal care  Write down your questions. Take them to your prenatal visits.  Keep all your prenatal visits as told by your health care provider. This is important. Safety  Wear your seat belt at all times when driving.  Make  a list of emergency phone numbers, including numbers for family, friends, the hospital, and police and fire departments. General instructions  Avoid cat litter boxes and soil used by cats. These carry germs that can cause birth defects in the baby. If you have a cat, ask someone to clean the litter box for you.  Do not travel far distances unless it is absolutely necessary and only with the approval of your health care provider.  Do not use hot tubs, steam rooms, or saunas.  Do not drink alcohol.  Do not use any products that contain nicotine or tobacco, such as cigarettes and e-cigarettes. If you need help quitting, ask your health care provider.  Do not use any medicinal herbs or unprescribed drugs. These chemicals affect the formation and growth of the baby.  Do not douche or use tampons or scented sanitary pads.  Do not cross your legs for long periods of time.  To prepare for the arrival of your baby: ? Take prenatal classes to understand, practice, and ask questions about labor and delivery. ? Make a trial run to the hospital. ? Visit the hospital and tour the maternity area. ? Arrange for maternity or paternity leave through employers. ? Arrange for family and friends to take care of pets while you are in the hospital. ? Purchase a rear-facing car seat and make sure you know how to install it in your car. ? Pack your hospital bag. ? Prepare the baby's nursery. Make sure to remove all pillows and stuffed animals from the baby's crib to prevent suffocation.  Visit your dentist if you have not gone during your pregnancy. Use a soft toothbrush to brush your teeth and be gentle when you floss. Contact a health care provider if:  You are unsure if you are in labor or if your water has broken.  You become dizzy.  You have mild pelvic cramps, pelvic pressure, or nagging pain in your abdominal area.  You have lower back pain.  You have persistent nausea, vomiting, or  diarrhea.  You have an unusual or bad smelling vaginal discharge.  You have pain when you urinate. Get help right away if:  Your water breaks before 37 weeks.  You have regular contractions less than 5 minutes apart before 37 weeks.  You have a fever.  You are leaking fluid from your vagina.  You have spotting or bleeding from your vagina.  You have severe abdominal pain or cramping.  You have rapid weight loss or weight gain.  You have  shortness of breath with chest pain.  You notice sudden or extreme swelling of your face, hands, ankles, feet, or legs.  Your baby makes fewer than 10 movements in 2 hours.  You have severe headaches that do not go away when you take medicine.  You have vision changes. Summary  The third trimester is from week 28 through week 40, months 7 through 9. The third trimester is a time when the unborn baby (fetus) is growing rapidly.  During the third trimester, your discomfort may increase as you and your baby continue to gain weight. You may have abdominal, leg, and back pain, sleeping problems, and an increased need to urinate.  During the third trimester your breasts will keep growing and they will continue to become tender. A yellow fluid (colostrum) may leak from your breasts. This is the first milk you are producing for your baby.  False labor is a condition in which you feel small, irregular tightenings of the muscles in the womb (contractions) that eventually go away. These are called Braxton Hicks contractions. Contractions may last for hours, days, or even weeks before true labor sets in.  Signs of labor can include: abdominal cramps; regular contractions that start at 10 minutes apart and become stronger and more frequent with time; watery or bloody mucus discharge that comes from the vagina; increased pelvic pressure and dull back pain; and leaking of amniotic fluid. This information is not intended to replace advice given to you by your  health care provider. Make sure you discuss any questions you have with your health care provider. Document Revised: 04/09/2019 Document Reviewed: 01/22/2017 Elsevier Patient Education  [place].

## 2020-09-14 ENCOUNTER — Other Ambulatory Visit: Payer: Self-pay | Admitting: Obstetrics and Gynecology

## 2020-09-14 DIAGNOSIS — Z98891 History of uterine scar from previous surgery: Secondary | ICD-10-CM

## 2020-09-29 ENCOUNTER — Ambulatory Visit (INDEPENDENT_AMBULATORY_CARE_PROVIDER_SITE_OTHER): Payer: PRIVATE HEALTH INSURANCE | Admitting: Obstetrics and Gynecology

## 2020-09-29 ENCOUNTER — Other Ambulatory Visit: Payer: Self-pay

## 2020-09-29 ENCOUNTER — Encounter: Payer: Self-pay | Admitting: Obstetrics and Gynecology

## 2020-09-29 VITALS — BP 119/75 | HR 82 | Wt 200.6 lb

## 2020-09-29 DIAGNOSIS — Z8619 Personal history of other infectious and parasitic diseases: Secondary | ICD-10-CM

## 2020-09-29 DIAGNOSIS — O0990 Supervision of high risk pregnancy, unspecified, unspecified trimester: Secondary | ICD-10-CM

## 2020-09-29 DIAGNOSIS — Z98891 History of uterine scar from previous surgery: Secondary | ICD-10-CM

## 2020-09-29 DIAGNOSIS — D573 Sickle-cell trait: Secondary | ICD-10-CM

## 2020-09-29 DIAGNOSIS — K51919 Ulcerative colitis, unspecified with unspecified complications: Secondary | ICD-10-CM

## 2020-09-29 NOTE — Patient Instructions (Signed)
Third Trimester of Pregnancy The third trimester is from week 28 through week 40 (months 7 through 9). The third trimester is a time when the unborn baby (fetus) is growing rapidly. At the end of the ninth month, the fetus is about 20 inches in length and weighs 6-10 pounds. Body changes during your third trimester Your body will continue to go through many changes during pregnancy. The changes vary from woman to woman. During the third trimester:  Your weight will continue to increase. You can expect to gain 25-35 pounds (11-16 kg) by the end of the pregnancy.  You may begin to get stretch marks on your hips, abdomen, and breasts.  You may urinate more often because the fetus is moving lower into your pelvis and pressing on your bladder.  You may develop or continue to have heartburn. This is caused by increased hormones that slow down muscles in the digestive tract.  You may develop or continue to have constipation because increased hormones slow digestion and cause the muscles that push waste through your intestines to relax.  You may develop hemorrhoids. These are swollen veins (varicose veins) in the rectum that can itch or be painful.  You may develop swollen, bulging veins (varicose veins) in your legs.  You may have increased body aches in the pelvis, back, or thighs. This is due to weight gain and increased hormones that are relaxing your joints.  You may have changes in your hair. These can include thickening of your hair, rapid growth, and changes in texture. Some women also have hair loss during or after pregnancy, or hair that feels dry or thin. Your hair will most likely return to normal after your baby is born.  Your breasts will continue to grow and they will continue to become tender. A yellow fluid (colostrum) may leak from your breasts. This is the first milk you are producing for your baby.  Your belly button may stick out.  You may notice more swelling in your hands,  face, or ankles.  You may have increased tingling or numbness in your hands, arms, and legs. The skin on your belly may also feel numb.  You may feel short of breath because of your expanding uterus.  You may have more problems sleeping. This can be caused by the size of your belly, increased need to urinate, and an increase in your body's metabolism.  You may notice the fetus "dropping," or moving lower in your abdomen (lightening).  You may have increased vaginal discharge.  You may notice your joints feel loose and you may have pain around your pelvic bone. What to expect at prenatal visits You will have prenatal exams every 2 weeks until week 36. Then you will have weekly prenatal exams. During a routine prenatal visit:  You will be weighed to make sure you and the baby are growing normally.  Your blood pressure will be taken.  Your abdomen will be measured to track your baby's growth.  The fetal heartbeat will be listened to.  Any test results from the previous visit will be discussed.  You may have a cervical check near your due date to see if your cervix has softened or thinned (effaced).  You will be tested for Group B streptococcus. This happens between 35 and 37 weeks. Your health care provider may ask you:  What your birth plan is.  How you are feeling.  If you are feeling the baby move.  If you have had any abnormal  symptoms, such as leaking fluid, bleeding, severe headaches, or abdominal cramping.  If you are using any tobacco products, including cigarettes, chewing tobacco, and electronic cigarettes.  If you have any questions. Other tests or screenings that may be performed during your third trimester include:  Blood tests that check for low iron levels (anemia).  Fetal testing to check the health, activity level, and growth of the fetus. Testing is done if you have certain medical conditions or if there are problems during the pregnancy.  Nonstress test  (NST). This test checks the health of your baby to make sure there are no signs of problems, such as the baby not getting enough oxygen. During this test, a belt is placed around your belly. The baby is made to move, and its heart rate is monitored during movement. What is false labor? False labor is a condition in which you feel small, irregular tightenings of the muscles in the womb (contractions) that usually go away with rest, changing position, or drinking water. These are called Braxton Hicks contractions. Contractions may last for hours, days, or even weeks before true labor sets in. If contractions come at regular intervals, become more frequent, increase in intensity, or become painful, you should see your health care provider. What are the signs of labor?  Abdominal cramps.  Regular contractions that start at 10 minutes apart and become stronger and more frequent with time.  Contractions that start on the top of the uterus and spread down to the lower abdomen and back.  Increased pelvic pressure and dull back pain.  A watery or bloody mucus discharge that comes from the vagina.  Leaking of amniotic fluid. This is also known as your "water breaking." It could be a slow trickle or a gush. Let your health care provider know if it has a color or strange odor. If you have any of these signs, call your health care provider right away, even if it is before your due date. Follow these instructions at home: Medicines  Follow your health care provider's instructions regarding medicine use. Specific medicines may be either safe or unsafe to take during pregnancy.  Take a prenatal vitamin that contains at least 600 micrograms (mcg) of folic acid.  If you develop constipation, try taking a stool softener if your health care provider approves. Eating and drinking   Eat a balanced diet that includes fresh fruits and vegetables, whole grains, good sources of protein such as meat, eggs, or tofu,  and low-fat dairy. Your health care provider will help you determine the amount of weight gain that is right for you.  Avoid raw meat and uncooked cheese. These carry germs that can cause birth defects in the baby.  If you have low calcium intake from food, talk to your health care provider about whether you should take a daily calcium supplement.  Eat four or five small meals rather than three large meals a day.  Limit foods that are high in fat and processed sugars, such as fried and sweet foods.  To prevent constipation: ? Drink enough fluid to keep your urine clear or pale yellow. ? Eat foods that are high in fiber, such as fresh fruits and vegetables, whole grains, and beans. Activity  Exercise only as directed by your health care provider. Most women can continue their usual exercise routine during pregnancy. Try to exercise for 30 minutes at least 5 days a week. Stop exercising if you experience uterine contractions.  Avoid heavy lifting.  Do  not exercise in extreme heat or humidity, or at high altitudes.  Wear low-heel, comfortable shoes.  Practice good posture.  You may continue to have sex unless your health care provider tells you otherwise. Relieving pain and discomfort  Take frequent breaks and rest with your legs elevated if you have leg cramps or low back pain.  Take warm sitz baths to soothe any pain or discomfort caused by hemorrhoids. Use hemorrhoid cream if your health care provider approves.  Wear a good support bra to prevent discomfort from breast tenderness.  If you develop varicose veins: ? Wear support pantyhose or compression stockings as told by your healthcare provider. ? Elevate your feet for 15 minutes, 3-4 times a day. Prenatal care  Write down your questions. Take them to your prenatal visits.  Keep all your prenatal visits as told by your health care provider. This is important. Safety  Wear your seat belt at all times when driving.  Make  a list of emergency phone numbers, including numbers for family, friends, the hospital, and police and fire departments. General instructions  Avoid cat litter boxes and soil used by cats. These carry germs that can cause birth defects in the baby. If you have a cat, ask someone to clean the litter box for you.  Do not travel far distances unless it is absolutely necessary and only with the approval of your health care provider.  Do not use hot tubs, steam rooms, or saunas.  Do not drink alcohol.  Do not use any products that contain nicotine or tobacco, such as cigarettes and e-cigarettes. If you need help quitting, ask your health care provider.  Do not use any medicinal herbs or unprescribed drugs. These chemicals affect the formation and growth of the baby.  Do not douche or use tampons or scented sanitary pads.  Do not cross your legs for long periods of time.  To prepare for the arrival of your baby: ? Take prenatal classes to understand, practice, and ask questions about labor and delivery. ? Make a trial run to the hospital. ? Visit the hospital and tour the maternity area. ? Arrange for maternity or paternity leave through employers. ? Arrange for family and friends to take care of pets while you are in the hospital. ? Purchase a rear-facing car seat and make sure you know how to install it in your car. ? Pack your hospital bag. ? Prepare the baby's nursery. Make sure to remove all pillows and stuffed animals from the baby's crib to prevent suffocation.  Visit your dentist if you have not gone during your pregnancy. Use a soft toothbrush to brush your teeth and be gentle when you floss. Contact a health care provider if:  You are unsure if you are in labor or if your water has broken.  You become dizzy.  You have mild pelvic cramps, pelvic pressure, or nagging pain in your abdominal area.  You have lower back pain.  You have persistent nausea, vomiting, or  diarrhea.  You have an unusual or bad smelling vaginal discharge.  You have pain when you urinate. Get help right away if:  Your water breaks before 37 weeks.  You have regular contractions less than 5 minutes apart before 37 weeks.  You have a fever.  You are leaking fluid from your vagina.  You have spotting or bleeding from your vagina.  You have severe abdominal pain or cramping.  You have rapid weight loss or weight gain.  You have  shortness of breath with chest pain.  You notice sudden or extreme swelling of your face, hands, ankles, feet, or legs.  Your baby makes fewer than 10 movements in 2 hours.  You have severe headaches that do not go away when you take medicine.  You have vision changes. Summary  The third trimester is from week 28 through week 40, months 7 through 9. The third trimester is a time when the unborn baby (fetus) is growing rapidly.  During the third trimester, your discomfort may increase as you and your baby continue to gain weight. You may have abdominal, leg, and back pain, sleeping problems, and an increased need to urinate.  During the third trimester your breasts will keep growing and they will continue to become tender. A yellow fluid (colostrum) may leak from your breasts. This is the first milk you are producing for your baby.  False labor is a condition in which you feel small, irregular tightenings of the muscles in the womb (contractions) that eventually go away. These are called Braxton Hicks contractions. Contractions may last for hours, days, or even weeks before true labor sets in.  Signs of labor can include: abdominal cramps; regular contractions that start at 10 minutes apart and become stronger and more frequent with time; watery or bloody mucus discharge that comes from the vagina; increased pelvic pressure and dull back pain; and leaking of amniotic fluid. This information is not intended to replace advice given to you by your  health care provider. Make sure you discuss any questions you have with your health care provider. Document Revised: 04/09/2019 Document Reviewed: 01/22/2017 Elsevier Patient Education  [place].

## 2020-09-29 NOTE — Progress Notes (Signed)
Subjective:  Carolyn Blake is a 30 y.o. G3P1011 at 69w5dbeing seen today for ongoing prenatal care.  She is currently monitored for the following issues for this high-risk pregnancy and has Anemia; Ulcerative colitis ([place]; History of PCR DNA positive for HSV2; Sickle cell trait (HMoon[place]; History of sexually transmitted disease (CT and trich); Supervision of high risk pregnancy, antepartum; Irregular heart beat; HSIL (high grade squamous intraepithelial lesion) on Pap smear of cervix; and H/O: cesarean section on their problem list.  Patient reports no complaints.  Contractions: Not present. Vag. Bleeding: None.  Movement: Present. Denies leaking of fluid.   The following portions of the patient's history were reviewed and updated as appropriate: allergies, current medications, past family history, past medical history, past social history, past surgical history and problem list. Problem list updated.  Objective:   Vitals:   09/29/20 1327  BP: 119/75  Pulse: 82  Weight: 200 lb 9.6 oz (91 kg)    Fetal Status: Fetal Heart Rate (bpm): 138   Movement: Present     General:  Alert, oriented and cooperative. Patient is in no acute distress.  Skin: Skin is warm and dry. No rash noted.   Cardiovascular: Normal heart rate noted  Respiratory: Normal respiratory effort, no problems with respiration noted  Abdomen: Soft, gravid, appropriate for gestational age. Pain/Pressure: Absent     Pelvic:  Cervical exam deferred        Extremities: Normal range of motion.  Edema: None  Mental Status: Normal mood and affect. Normal behavior. Normal judgment and thought content.   Urinalysis:      Assessment and Plan:  Pregnancy: G3P1011 at 335w5d1. Supervision of high risk pregnancy, antepartum stable  2. H/O: cesarean section For repeat at 39 weeks  3. Sickle cell trait (HCC) Stable UC negative last month  4. Ulcerative colitis with complication, unspecified location (HCC) Stable Followed by  GI  5. History of PCR DNA positive for HSV2 Valtrex at next visit  Preterm labor symptoms and general obstetric precautions including but not limited to vaginal bleeding, contractions, leaking of fluid and fetal movement were reviewed in detail with the patient. Please refer to After Visit Summary for other counseling recommendations.  Return for OB visit, face to face, MD only.   ErChancy MilroyMD

## 2020-10-17 ENCOUNTER — Other Ambulatory Visit (HOSPITAL_COMMUNITY)
Admission: RE | Admit: 2020-10-17 | Discharge: 2020-10-17 | Disposition: A | Discharge status: Home / Self Care | Payer: Medicaid Other | Source: Ambulatory Visit | Attending: Family Medicine | Admitting: Family Medicine

## 2020-10-17 ENCOUNTER — Ambulatory Visit (INDEPENDENT_AMBULATORY_CARE_PROVIDER_SITE_OTHER): Payer: Medicaid Other | Admitting: Family Medicine

## 2020-10-17 ENCOUNTER — Encounter: Payer: Self-pay | Admitting: Family Medicine

## 2020-10-17 VITALS — BP 122/73 | HR 97 | Wt 200.3 lb

## 2020-10-17 DIAGNOSIS — O0990 Supervision of high risk pregnancy, unspecified, unspecified trimester: Secondary | ICD-10-CM

## 2020-10-17 DIAGNOSIS — K51919 Ulcerative colitis, unspecified with unspecified complications: Secondary | ICD-10-CM

## 2020-10-17 DIAGNOSIS — D573 Sickle-cell trait: Secondary | ICD-10-CM

## 2020-10-17 DIAGNOSIS — Z8619 Personal history of other infectious and parasitic diseases: Secondary | ICD-10-CM

## 2020-10-17 DIAGNOSIS — Z98891 History of uterine scar from previous surgery: Secondary | ICD-10-CM

## 2020-10-17 MED ORDER — VALACYCLOVIR HCL 1 G PO TABS: 500.0000 mg | ORAL_TABLET | Freq: Two times a day (BID) | ORAL | 2 refills | Status: DC

## 2020-10-17 NOTE — Patient Instructions (Signed)

## 2020-10-18 LAB — GC/CHLAMYDIA PROBE AMP (~~LOC~~) NOT AT ARMC
Chlamydia: NEGATIVE
Comment: NEGATIVE
Comment: NORMAL
Neisseria Gonorrhea: NEGATIVE

## 2020-10-18 NOTE — Progress Notes (Signed)
   PRENATAL VISIT NOTE  Subjective:  Carolyn Blake is a 30 y.o. G3P1011 at 67w3dbeing seen today for ongoing prenatal care.  She is currently monitored for the following issues for this high-risk pregnancy and has Anemia; Ulcerative colitis ([place]; History of PCR DNA positive for HSV2; Sickle cell trait ([place]; History of sexually transmitted disease (CT and trich); Supervision of high risk pregnancy, antepartum; Irregular heart beat; HSIL (high grade squamous intraepithelial lesion) on Pap smear of cervix; and H/O: cesarean section on their problem list.  Patient reports no complaints.  Contractions: Not present. Vag. Bleeding: None.  Movement: Present. Denies leaking of fluid.   The following portions of the patient's history were reviewed and updated as appropriate: allergies, current medications, past family history, past medical history, past social history, past surgical history and problem list.   Objective:   Vitals:   10/17/20 1023  BP: 122/73  Pulse: 97  Weight: 200 lb 4.8 oz (90.9 kg)    Fetal Status: Fetal Heart Rate (bpm): 144   Movement: Present     General:  Alert, oriented and cooperative. Patient is in no acute distress.  Skin: Skin is warm and dry. No rash noted.   Cardiovascular: Normal heart rate noted  Respiratory: Normal respiratory effort, no problems with respiration noted  Abdomen: Soft, gravid, appropriate for gestational age.  Pain/Pressure: Absent     Pelvic: Cervical exam deferred        Extremities: Normal range of motion.  Edema: None  Mental Status: Normal mood and affect. Normal behavior. Normal judgment and thought content.   Assessment and Plan:  Pregnancy: G3P1011 at 367w3d. Ulcerative colitis with complication, unspecified location ([facility] HospitalCurrently on [facility] meds doing well  2. H/O: cesarean section RCS scheduled  3. Supervision of high risk pregnancy, antepartum Cultures today - Culture, beta strep (group b only) - GC/Chlamydia probe amp  ([place]. Sickle cell trait (HCC) - Culture, OB Urine  5. History of PCR DNA positive for HSV2 Begin trial of Valtrex for suppression - valACYclovir (VALTREX) 1000 MG tablet; Take 0.5 tablets (500 mg total) by mouth 2 (two) times daily.  Dispense: 60 tablet; Refill: 2  Preterm labor symptoms and general obstetric precautions including but not limited to vaginal bleeding, contractions, leaking of fluid and fetal movement were reviewed in detail with the patient. Please refer to After Visit Summary for other counseling recommendations.   Return in 1 week (on 10/24/2020) for [facility].  Future Appointments  Date Time Provider DeLa [place]10/25/2021  9:15 AM PiAletha HalimMD [facility]11/01/2020  1:15 PM BaGriffin BasilMD [facility]MCor[facility]ML[facility]  TaDonnamae JudeMD

## 2020-10-21 LAB — CULTURE, BETA STREP (GROUP B ONLY): Strep Gp B Culture: NEGATIVE

## 2020-10-21 LAB — URINE CULTURE, OB REFLEX

## 2020-10-21 LAB — CULTURE, OB URINE

## 2020-10-24 ENCOUNTER — Telehealth (INDEPENDENT_AMBULATORY_CARE_PROVIDER_SITE_OTHER): Payer: Medicaid Other | Admitting: Obstetrics and Gynecology

## 2020-10-24 ENCOUNTER — Encounter (HOSPITAL_COMMUNITY): Payer: Self-pay

## 2020-10-24 ENCOUNTER — Telehealth (HOSPITAL_COMMUNITY): Payer: Self-pay | Admitting: *Deleted

## 2020-10-24 DIAGNOSIS — Z98891 History of uterine scar from previous surgery: Secondary | ICD-10-CM

## 2020-10-24 DIAGNOSIS — O0993 Supervision of high risk pregnancy, unspecified, third trimester: Secondary | ICD-10-CM | POA: Diagnosis not present

## 2020-10-24 DIAGNOSIS — O99013 Anemia complicating pregnancy, third trimester: Secondary | ICD-10-CM | POA: Diagnosis not present

## 2020-10-24 DIAGNOSIS — Z3A37 37 weeks gestation of pregnancy: Secondary | ICD-10-CM

## 2020-10-24 DIAGNOSIS — Z8619 Personal history of other infectious and parasitic diseases: Secondary | ICD-10-CM

## 2020-10-24 DIAGNOSIS — O34219 Maternal care for unspecified type scar from previous cesarean delivery: Secondary | ICD-10-CM

## 2020-10-24 DIAGNOSIS — O98313 Other infections with a predominantly sexual mode of transmission complicating pregnancy, third trimester: Secondary | ICD-10-CM | POA: Diagnosis not present

## 2020-10-24 DIAGNOSIS — D573 Sickle-cell trait: Secondary | ICD-10-CM

## 2020-10-24 DIAGNOSIS — I499 Cardiac arrhythmia, unspecified: Secondary | ICD-10-CM

## 2020-10-24 DIAGNOSIS — D649 Anemia, unspecified: Secondary | ICD-10-CM

## 2020-10-24 DIAGNOSIS — B009 Herpesviral infection, unspecified: Secondary | ICD-10-CM

## 2020-10-24 DIAGNOSIS — O0990 Supervision of high risk pregnancy, unspecified, unspecified trimester: Secondary | ICD-10-CM

## 2020-10-24 NOTE — Telephone Encounter (Signed)
Preadmission screen  

## 2020-10-24 NOTE — Progress Notes (Signed)
I connected with  Conchita Paris on 10/24/20 at  9:15 AM EDT by telephone and verified that I am speaking with the correct person using two identifiers.   I discussed the limitations, risks, security and privacy concerns of performing an evaluation and management service by telephone and the availability of in person appointments. I also discussed with the patient that there may be a patient responsible charge related to this service. The patient expressed understanding and agreed to proceed.  Derinda Late, RN 10/24/2020  9:00 AM

## 2020-10-24 NOTE — Patient Instructions (Signed)
Dustyn Armbrister  10/24/2020   Your procedure is scheduled on:  11/05/2020  Arrive at 75 at Entrance C on [address] at [facility]  and [employer] [employer] [address]. You are invited to use the FREE valet parking or use the Visitor's parking deck.  Pick up the phone at the desk and dial [phone].  Call this number if you have problems the morning of surgery: [phone]  Remember:   Do not eat food:(After Midnight) Desps de medianoche.  Do not drink clear liquids: (After Midnight) Desps de medianoche.  Take these medicines the morning of surgery with A SIP OF WATER:  TAKE VALTREX AS PRESCRIBED   Do not wear jewelry, make-up or nail polish.  Do not wear lotions, powders, or perfumes. Do not wear deodorant.  Do not shave 48 hours prior to surgery.  Do not bring valuables to the hospital.  [facility] is not   responsible for any belongings or valuables brought to the hospital.  Contacts, dentures or bridgework may not be worn into surgery.  Leave suitcase in the car. After surgery it may be brought to your room.  For patients admitted to the hospital, checkout time is 11:00 AM the day of              discharge.      Please read over the following fact sheets that you were given:     Preparing for Surgery

## 2020-10-24 NOTE — Progress Notes (Signed)
   TELEHEALTH VIRTUAL OBSTETRICS VISIT ENCOUNTER NOTE  Clinic: [facility]  I connected with Tammatha Cobb on 10/24/20 at  9:15 AM EDT by telephone at home and verified that I am speaking with the correct person using two identifiers.   I discussed the limitations, risks, security and privacy concerns of performing an evaluation and management service by telephone and the availability of in person appointments. I also discussed with the patient that there may be a patient responsible charge related to this service. The patient expressed understanding and agreed to proceed.  Subjective:  Carolyn Blake is a 30 y.o. G3P1011 at 9w2dbeing followed for ongoing prenatal care.  She is currently monitored for the following issues for this high-risk pregnancy and has Anemia; Ulcerative colitis ([place]; History of PCR DNA positive for HSV2; Sickle cell trait (HD[place]; History of sexually transmitted disease (CT and trich); Supervision of high risk pregnancy, antepartum; Irregular heart beat; HSIL (high grade squamous intraepithelial lesion) on Pap smear of cervix; and H/O: cesarean section on their problem list.  Patient reports hemorrhoids. Reports fetal movement. Denies any contractions, bleeding or leaking of fluid.   The following portions of the patient's history were reviewed and updated as appropriate: allergies, current medications, past family history, past medical history, past social history, past surgical history and problem list.   Objective:  There were no vitals filed for this visit.  Babyscripts Data Reviewed: not applicable  General:  Alert, oriented and cooperative.   Mental Status: Normal mood and affect perceived. Normal judgment and thought content.  Rest of physical exam deferred due to type of encounter  Assessment and Plan:  Pregnancy: G3P1011 at 376w2d. Supervision of high risk pregnancy, antepartum Routine care. Pt to take bp and weight and let usKoreaknow  2. H/O: cesarean section Rpt already scheduled  3. History of PCR DNA positive for HSV2 Confirms valtrex. No issues  4. UC Doing well on no meds. Recommend OTC for hemorrhoids, avoid constipation and warm soaks  Preterm labor symptoms and general obstetric precautions including but not limited to vaginal bleeding, contractions, leaking of fluid and fetal movement were reviewed in detail with the patient.  I discussed the assessment and treatment plan with the patient. The patient was provided an opportunity to ask questions and all were answered. The patient agreed with the plan and demonstrated an understanding of the instructions. The patient was advised to call back or seek an in-person office evaluation/[facility] any urgent or concerning symptoms. Please refer to After Visit Summary for other counseling recommendations.   I provided 7 minutes of non-face-to-face time during this encounter. The visit was conducted via MyChart-medicine  Return in about 1 week (around 10/31/2020) for in person, virtual visit, low risk, high risk.  Future Appointments  Date Time Provider DeNew Auburn10/25/2021  9:15 AM PiAletha HalimMD WM[facility]11/01/2020  1:15 PM BaGriffin BasilMD WMEn[facility]MS[facility]11/03/2020  9:30 AM MC-LD PAT 1 MC-INDC None    ChAletha HalimMD Center for WoDean [facility] CompanyCoC[facility]

## 2020-10-25 ENCOUNTER — Encounter (HOSPITAL_COMMUNITY): Payer: Self-pay

## 2020-10-31 ENCOUNTER — Other Ambulatory Visit (HOSPITAL_COMMUNITY): Payer: Self-pay | Admitting: Obstetrics & Gynecology

## 2020-10-31 ENCOUNTER — Other Ambulatory Visit: Payer: Self-pay

## 2020-10-31 ENCOUNTER — Ambulatory Visit (INDEPENDENT_AMBULATORY_CARE_PROVIDER_SITE_OTHER): Payer: Medicaid Other | Admitting: Obstetrics and Gynecology

## 2020-10-31 VITALS — BP 133/60 | HR 93 | Wt 208.0 lb

## 2020-10-31 DIAGNOSIS — R87613 High grade squamous intraepithelial lesion on cytologic smear of cervix (HGSIL): Secondary | ICD-10-CM

## 2020-10-31 DIAGNOSIS — O0990 Supervision of high risk pregnancy, unspecified, unspecified trimester: Secondary | ICD-10-CM

## 2020-10-31 DIAGNOSIS — K5190 Ulcerative colitis, unspecified, without complications: Secondary | ICD-10-CM

## 2020-10-31 DIAGNOSIS — Z3A38 38 weeks gestation of pregnancy: Secondary | ICD-10-CM | POA: Insufficient documentation

## 2020-10-31 DIAGNOSIS — D573 Sickle-cell trait: Secondary | ICD-10-CM

## 2020-10-31 DIAGNOSIS — Z98891 History of uterine scar from previous surgery: Secondary | ICD-10-CM

## 2020-10-31 NOTE — Progress Notes (Signed)
   PRENATAL VISIT NOTE  Subjective:  Carolyn Blake is a 30 y.o. G3P1011 at 37w2dbeing seen today for ongoing prenatal care.  She is currently monitored for the following issues for this high-risk pregnancy and has Anemia; Ulcerative colitis (H[place]; History of PCR DNA positive for HSV2; Sickle cell trait (HMarietta[place]Alder[place]; History of sexually transmitted disease (CT and trich); Supervision of high risk pregnancy, antepartum; Irregular heart beat; HSIL (high grade squamous intraepithelial lesion) on Pap smear of cervix; H/O: cesarean section; and [redacted] weeks gestation of pregnancy on their problem list.  Patient doing well with no acute concerns today. She reports no complaints.  Contractions: Irritability. Vag. Bleeding: None.  Movement: Present. Denies leaking of fluid.   Pt is scheduled for repeat cesarean section on 11/6.  All questions answered.  Pt is still unsure about contraception.  She is considering IUD versus depo-provera.  If she picks IUD, she is not sure what type she wants.  The following portions of the patient's history were reviewed and updated as appropriate: allergies, current medications, past family history, past medical history, past social history, past surgical history and problem list. Problem list updated.  Objective:   Vitals:   10/31/20 1308  BP: 133/60  Pulse: 93  Weight: 208 lb (94.3 kg)    Fetal Status: Fetal Heart Rate (bpm): 130 Fundal Height: 39 cm Movement: Present     General:  Alert, oriented and cooperative. Patient is in no acute distress.  Skin: Skin is warm and dry. No rash noted.   Cardiovascular: Normal heart rate noted  Respiratory: Normal respiratory effort, no problems with respiration noted  Abdomen: Soft, gravid, appropriate for gestational age.  Pain/Pressure: Absent     Pelvic: Cervical exam deferred        Extremities: Normal range of motion.  Edema: Trace  Mental Status:  Normal mood and affect. Normal behavior. Normal judgment and thought  content.   Assessment and Plan:  Pregnancy: G3P1011 at 310w2d1. Ulcerative colitis without complications, unspecified location (HCFruitlandNo current issues  2. Sickle cell trait (HCTioga  3. Supervision of high risk pregnancy, antepartum   4. HSIL (high grade squamous intraepithelial lesion) on Pap smear of cervix repap after delivery  5. H/O: cesarean section Repeat c section scheduled.  Discussed birth control choice, see note  6. [redacted] weeks gestation of pregnancy   Term labor symptoms and general obstetric precautions including but not limited to vaginal bleeding, contractions, leaking of fluid and fetal movement were reviewed in detail with the patient.  Please refer to After Visit Summary for other counseling recommendations.   No follow-ups on file.   LaLynnda ShieldsMD

## 2020-10-31 NOTE — Patient Instructions (Signed)
Cesarean Delivery Cesarean birth, or cesarean delivery, is the surgical delivery of a baby through an incision in the abdomen and the uterus. This may be referred to as a C-section. This procedure may be scheduled ahead of time, or it may be done in an emergency situation. Tell a health care provider about:  Any allergies you have.  All medicines you are taking, including vitamins, herbs, eye drops, creams, and over-the-counter medicines.  Any problems you or family members have had with anesthetic medicines.  Any blood disorders you have.  Any surgeries you have had.  Any medical conditions you have.  Whether you or any members of your family have a history of deep vein thrombosis (DVT) or pulmonary embolism (PE). What are the risks? Generally, this is a safe procedure. However, problems may occur, including:  Infection.  Bleeding.  Allergic reactions to medicines.  Damage to other structures or organs.  Blood clots.  Injury to your baby. What happens before the procedure? General instructions  Follow instructions from your health care provider about eating or drinking restrictions.  If you know that you are going to have a cesarean delivery, do not shave your pubic area. Shaving before the procedure may increase your risk of infection.  Plan to have someone take you home from the hospital.  Ask your health care provider what steps will be taken to prevent infection. These may include: ? Removing hair at the surgery site. ? Washing skin with a germ-killing soap. ? Taking antibiotic medicine.  Depending on the reason for your cesarean delivery, you may have a physical exam or additional testing, such as an ultrasound.  You may have your blood or urine tested. Questions for your health care provider  Ask your health care provider about: ? Changing or stopping your regular medicines. This is especially important if you are taking diabetes medicines or blood  thinners. ? Your pain management plan. This is especially important if you plan to breastfeed your baby. ? How long you will be in the hospital after the procedure. ? Any concerns you may have about receiving blood products, if you need them during the procedure. ? Cord blood banking, if you plan to collect your baby's umbilical cord blood.  You may also want to ask your health care provider: ? Whether you will be able to hold or breastfeed your baby while you are still in the operating room. ? Whether your baby can stay with you immediately after the procedure and during your recovery. ? Whether a family member or a person of your choice can go with you into the operating room and stay with you during the procedure, immediately after the procedure, and during your recovery. What happens during the procedure?   An IV will be inserted into one of your veins.  Fluid and medicines, such as antibiotics, will be given before the surgery.  Fetal monitors will be placed on your abdomen to check your baby's heart rate.  You may be given a special warming gown to wear to keep your temperature stable.  A catheter may be inserted into your bladder through your urethra. This drains your urine during the procedure.  You may be given one or more of the following: ? A medicine to numb the area (local anesthetic). ? A medicine to make you fall asleep (general anesthetic). ? A medicine (regional anesthetic) that is injected into your back or through a small thin tube placed in your back (spinal anesthetic or epidural anesthetic).  This numbs everything below the injection site and allows you to stay awake during your procedure. If this makes you feel nauseous, tell your health care provider. Medicines will be available to help reduce any nausea you may feel.  An incision will be made in your abdomen, and then in your uterus.  If you are awake during your procedure, you may feel tugging and pulling in  your abdomen, but you should not feel pain. If you feel pain, tell your health care provider immediately.  Your baby will be removed from your uterus. You may feel more pressure or pushing while this happens.  Immediately after birth, your baby will be dried and kept warm. You may be able to hold and breastfeed your baby.  The umbilical cord may be clamped and cut during this time. This usually occurs after waiting a period of 1-2 minutes after delivery.  Your placenta will be removed from your uterus.  Your incisions will be closed with stitches (sutures). Staples, skin glue, or adhesive strips may also be applied to the incision in your abdomen.  Bandages (dressings) may be placed over the incision in your abdomen. The procedure may vary among health care providers and hospitals. What happens after the procedure?  Your blood pressure, heart rate, breathing rate, and blood oxygen level will be monitored until you are discharged from the hospital.  You may continue to receive fluids and medicines through an IV.  You will have some pain. Medicines will be available to help control your pain.  To help prevent blood clots: ? You may be given medicines. ? You may have to wear compression stockings or devices. ? You will be encouraged to walk around when you are able.  Hospital staff will encourage and support bonding with your baby. Your hospital may have you and your baby to stay in the same room (rooming in) during your hospital stay to encourage successful bonding and breastfeeding.  You may be encouraged to cough and breathe deeply often. This helps to prevent lung problems.  If you have a catheter draining your urine, it will be removed as soon as possible after your procedure. Summary  Cesarean birth, or cesarean delivery, is the surgical delivery of a baby through an incision in the abdomen and the uterus.  Follow instructions from your health care provider about eating or  drinking restrictions before the procedure.  You will have some pain after the procedure. Medicines will be available to help control your pain.  Hospital staff will encourage and support bonding with your baby after the procedure. Your hospital may have you and your baby to stay in the same room (rooming in) during your hospital stay to encourage successful bonding and breastfeeding. This information is not intended to replace advice given to you by your health care provider. Make sure you discuss any questions you have with your health care provider. Document Revised: 06/23/2018 Document Reviewed: 06/23/2018 Elsevier Patient Education  [place].

## 2020-11-03 ENCOUNTER — Encounter (HOSPITAL_COMMUNITY)
Admission: RE | Admit: 2020-11-03 | Discharge: 2020-11-03 | Disposition: A | Discharge status: Home / Self Care | Payer: BLUE CROSS/BLUE SHIELD | Source: Ambulatory Visit | Attending: Obstetrics & Gynecology | Admitting: Obstetrics & Gynecology

## 2020-11-03 ENCOUNTER — Other Ambulatory Visit: Payer: Self-pay

## 2020-11-03 ENCOUNTER — Other Ambulatory Visit (HOSPITAL_COMMUNITY)
Admission: RE | Admit: 2020-11-03 | Discharge: 2020-11-03 | Disposition: A | Discharge status: Home / Self Care | Payer: BLUE CROSS/BLUE SHIELD | Source: Ambulatory Visit | Attending: Obstetrics and Gynecology | Admitting: Obstetrics and Gynecology

## 2020-11-03 DIAGNOSIS — Z20822 Contact with and (suspected) exposure to covid-19: Secondary | ICD-10-CM | POA: Insufficient documentation

## 2020-11-03 DIAGNOSIS — Z01812 Encounter for preprocedural laboratory examination: Secondary | ICD-10-CM | POA: Diagnosis present

## 2020-11-03 LAB — CBC
HCT: 34.5 % — ABNORMAL LOW (ref 36.0–46.0)
Hemoglobin: 11.2 g/dL — ABNORMAL LOW (ref 12.0–15.0)
MCH: 28.8 pg (ref 26.0–34.0)
MCHC: 32.5 g/dL (ref 30.0–36.0)
MCV: 88.7 fL (ref 80.0–100.0)
Platelets: 211 10*3/uL (ref 150–400)
RBC: 3.89 MIL/uL (ref 3.87–5.11)
RDW: 15.2 % (ref 11.5–15.5)
WBC: 7.6 10*3/uL (ref 4.0–10.5)
nRBC: 0 % (ref 0.0–0.2)

## 2020-11-03 LAB — SARS CORONAVIRUS 2 (TAT 6-24 HRS): SARS Coronavirus 2: NEGATIVE

## 2020-11-04 LAB — RPR: RPR Ser Ql: NONREACTIVE

## 2020-11-05 ENCOUNTER — Encounter (HOSPITAL_COMMUNITY): Payer: Self-pay | Admitting: Obstetrics & Gynecology

## 2020-11-05 ENCOUNTER — Other Ambulatory Visit: Payer: Self-pay

## 2020-11-05 ENCOUNTER — Encounter (HOSPITAL_COMMUNITY)
Admission: RE | Disposition: A | Discharge status: Home / Self Care | Payer: Self-pay | Source: Home / Self Care | Attending: Obstetrics & Gynecology

## 2020-11-05 ENCOUNTER — Inpatient Hospital Stay (HOSPITAL_COMMUNITY)
Admission: RE | Admit: 2020-11-05 | Discharge: 2020-11-07 | DRG: 788 | Disposition: A | Discharge status: Home / Self Care | Payer: Medicaid Other | Attending: Obstetrics & Gynecology | Admitting: Obstetrics & Gynecology

## 2020-11-05 ENCOUNTER — Inpatient Hospital Stay (HOSPITAL_COMMUNITY): Payer: Medicaid Other | Admitting: Anesthesiology

## 2020-11-05 DIAGNOSIS — O0990 Supervision of high risk pregnancy, unspecified, unspecified trimester: Principal | ICD-10-CM

## 2020-11-05 DIAGNOSIS — Z20822 Contact with and (suspected) exposure to covid-19: Secondary | ICD-10-CM | POA: Diagnosis present

## 2020-11-05 DIAGNOSIS — Z30430 Encounter for insertion of intrauterine contraceptive device: Secondary | ICD-10-CM | POA: Diagnosis not present

## 2020-11-05 DIAGNOSIS — O34211 Maternal care for low transverse scar from previous cesarean delivery: Secondary | ICD-10-CM | POA: Diagnosis present

## 2020-11-05 DIAGNOSIS — D573 Sickle-cell trait: Secondary | ICD-10-CM | POA: Diagnosis present

## 2020-11-05 DIAGNOSIS — R87613 High grade squamous intraepithelial lesion on cytologic smear of cervix (HGSIL): Secondary | ICD-10-CM | POA: Diagnosis present

## 2020-11-05 DIAGNOSIS — Z975 Presence of (intrauterine) contraceptive device: Secondary | ICD-10-CM | POA: Diagnosis not present

## 2020-11-05 DIAGNOSIS — I499 Cardiac arrhythmia, unspecified: Secondary | ICD-10-CM

## 2020-11-05 DIAGNOSIS — O99214 Obesity complicating childbirth: Secondary | ICD-10-CM | POA: Diagnosis present

## 2020-11-05 DIAGNOSIS — Z98891 History of uterine scar from previous surgery: Secondary | ICD-10-CM

## 2020-11-05 DIAGNOSIS — O26893 Other specified pregnancy related conditions, third trimester: Secondary | ICD-10-CM | POA: Diagnosis present

## 2020-11-05 DIAGNOSIS — O9902 Anemia complicating childbirth: Secondary | ICD-10-CM | POA: Diagnosis present

## 2020-11-05 DIAGNOSIS — E669 Obesity, unspecified: Secondary | ICD-10-CM | POA: Diagnosis present

## 2020-11-05 DIAGNOSIS — Z3A39 39 weeks gestation of pregnancy: Secondary | ICD-10-CM | POA: Diagnosis not present

## 2020-11-05 DIAGNOSIS — Z30014 Encounter for initial prescription of intrauterine contraceptive device: Secondary | ICD-10-CM | POA: Diagnosis not present

## 2020-11-05 LAB — CBC
HCT: 34.5 % — ABNORMAL LOW (ref 36.0–46.0)
Hemoglobin: 11.1 g/dL — ABNORMAL LOW (ref 12.0–15.0)
MCH: 28.3 pg (ref 26.0–34.0)
MCHC: 32.2 g/dL (ref 30.0–36.0)
MCV: 88 fL (ref 80.0–100.0)
Platelets: 189 10*3/uL (ref 150–400)
RBC: 3.92 MIL/uL (ref 3.87–5.11)
RDW: 15.3 % (ref 11.5–15.5)
WBC: 9.7 10*3/uL (ref 4.0–10.5)
nRBC: 0 % (ref 0.0–0.2)

## 2020-11-05 LAB — CREATININE, SERUM
Creatinine, Ser: 0.45 mg/dL (ref 0.44–1.00)
GFR, Estimated: >60 mL/min (ref >60)

## 2020-11-05 SURGERY — Surgical Case
Anesthesia: Spinal

## 2020-11-05 MED ORDER — LEVONORGESTREL 19.5 MCG/DAY IU IUD: INTRAUTERINE_SYSTEM | Freq: Once | INTRAUTERINE | Status: AC

## 2020-11-05 MED ORDER — ACETAMINOPHEN 500 MG PO TABS
1000.0000 mg | ORAL_TABLET | Freq: Three times a day (TID) | ORAL | Status: DC
  Administered 2020-11-05 – 2020-11-07 (×6): 1000 mg via ORAL
  Filled 2020-11-05 (×6): qty 2

## 2020-11-05 MED ORDER — WITCH HAZEL-GLYCERIN EX PADS: 1.0000 "application " | MEDICATED_PAD | CUTANEOUS | Status: DC | PRN

## 2020-11-05 MED ORDER — DEXAMETHASONE SODIUM PHOSPHATE 10 MG/ML IJ SOLN
INTRAMUSCULAR | Status: AC
  Filled 2020-11-05: qty 1

## 2020-11-05 MED ORDER — LACTATED RINGERS IV SOLN: INTRAVENOUS | Status: DC

## 2020-11-05 MED ORDER — BUPIVACAINE IN DEXTROSE 0.75-8.25 % IT SOLN
INTRATHECAL | Status: DC | PRN
  Administered 2020-11-05: 1.6 mL via INTRATHECAL

## 2020-11-05 MED ORDER — OXYTOCIN-SODIUM CHLORIDE 30-0.9 UT/500ML-% IV SOLN
INTRAVENOUS | Status: AC
  Filled 2020-11-05: qty 500

## 2020-11-05 MED ORDER — DIPHENHYDRAMINE HCL 50 MG/ML IJ SOLN
12.5000 mg | INTRAMUSCULAR | Status: DC | PRN
  Administered 2020-11-05 – 2020-11-06 (×2): 12.5 mg via INTRAVENOUS
  Filled 2020-11-05 (×2): qty 1

## 2020-11-05 MED ORDER — ONDANSETRON HCL 4 MG/2ML IJ SOLN
INTRAMUSCULAR | Status: AC
  Filled 2020-11-05: qty 2

## 2020-11-05 MED ORDER — NALBUPHINE HCL 10 MG/ML IJ SOLN: 5.0000 mg | INTRAMUSCULAR | Status: DC | PRN

## 2020-11-05 MED ORDER — PHENYLEPHRINE HCL-NACL 20-0.9 MG/250ML-% IV SOLN
INTRAVENOUS | Status: DC | PRN
  Administered 2020-11-05: 60 ug/min via INTRAVENOUS

## 2020-11-05 MED ORDER — SIMETHICONE 80 MG PO CHEW
80.0000 mg | CHEWABLE_TABLET | Freq: Three times a day (TID) | ORAL | Status: DC
  Administered 2020-11-05 – 2020-11-07 (×5): 80 mg via ORAL
  Filled 2020-11-05 (×5): qty 1

## 2020-11-05 MED ORDER — OXYCODONE HCL 5 MG PO TABS: 5.0000 mg | ORAL_TABLET | Freq: Once | ORAL | Status: DC | PRN

## 2020-11-05 MED ORDER — EPHEDRINE SULFATE 50 MG/ML IJ SOLN
INTRAMUSCULAR | Status: DC | PRN
  Administered 2020-11-05: 25 mg via INTRAVENOUS

## 2020-11-05 MED ORDER — ACETAMINOPHEN 500 MG PO TABS
ORAL_TABLET | ORAL | Status: AC
  Filled 2020-11-05: qty 2

## 2020-11-05 MED ORDER — SENNOSIDES-DOCUSATE SODIUM 8.6-50 MG PO TABS
2.0000 | ORAL_TABLET | ORAL | Status: DC
  Administered 2020-11-05 – 2020-11-06 (×2): 2 via ORAL
  Filled 2020-11-05 (×2): qty 2

## 2020-11-05 MED ORDER — MORPHINE SULFATE (PF) 0.5 MG/ML IJ SOLN
INTRAMUSCULAR | Status: DC | PRN
  Administered 2020-11-05: 150 ug via INTRATHECAL

## 2020-11-05 MED ORDER — FENTANYL CITRATE (PF) 100 MCG/2ML IJ SOLN
INTRAMUSCULAR | Status: DC | PRN
  Administered 2020-11-05: 15 ug via INTRATHECAL

## 2020-11-05 MED ORDER — SODIUM CHLORIDE 0.9 % IV SOLN: INTRAVENOUS | Status: DC | PRN

## 2020-11-05 MED ORDER — ONDANSETRON HCL 4 MG/2ML IJ SOLN: 4.0000 mg | Freq: Three times a day (TID) | INTRAMUSCULAR | Status: DC | PRN

## 2020-11-05 MED ORDER — ACETAMINOPHEN 500 MG PO TABS
1000.0000 mg | ORAL_TABLET | ORAL | Status: AC
  Administered 2020-11-05: 1000 mg via ORAL

## 2020-11-05 MED ORDER — SOD CITRATE-CITRIC ACID 500-334 MG/5ML PO SOLN
ORAL | Status: AC
  Filled 2020-11-05: qty 30

## 2020-11-05 MED ORDER — EPHEDRINE 5 MG/ML INJ
INTRAVENOUS | Status: AC
  Filled 2020-11-05: qty 10

## 2020-11-05 MED ORDER — CEFAZOLIN SODIUM-DEXTROSE 2-4 GM/100ML-% IV SOLN: 2.0000 g | INTRAVENOUS | Status: DC

## 2020-11-05 MED ORDER — POVIDONE-IODINE 10 % EX SWAB
2.0000 "application " | Freq: Once | CUTANEOUS | Status: AC
  Administered 2020-11-05: 2 via TOPICAL

## 2020-11-05 MED ORDER — NALBUPHINE HCL 10 MG/ML IJ SOLN: 5.0000 mg | Freq: Once | INTRAMUSCULAR | Status: DC | PRN

## 2020-11-05 MED ORDER — TETANUS-DIPHTH-ACELL PERTUSSIS 5-2.5-18.5 LF-MCG/0.5 IM SUSY: 0.5000 mL | PREFILLED_SYRINGE | Freq: Once | INTRAMUSCULAR | Status: DC

## 2020-11-05 MED ORDER — LEVONORGESTREL 19.5 MCG/DAY IU IUD
INTRAUTERINE_SYSTEM | INTRAUTERINE | Status: AC
  Filled 2020-11-05: qty 1

## 2020-11-05 MED ORDER — NALBUPHINE HCL 10 MG/ML IJ SOLN
5.0000 mg | INTRAMUSCULAR | Status: DC | PRN
  Administered 2020-11-05: 5 mg via INTRAVENOUS
  Filled 2020-11-05: qty 1

## 2020-11-05 MED ORDER — ENOXAPARIN SODIUM 60 MG/0.6ML ~~LOC~~ SOLN
0.5000 mg/kg | SUBCUTANEOUS | Status: DC
  Administered 2020-11-06 – 2020-11-07 (×2): 47.5 mg via SUBCUTANEOUS
  Filled 2020-11-05 (×2): qty 0.6

## 2020-11-05 MED ORDER — SIMETHICONE 80 MG PO CHEW: 80.0000 mg | CHEWABLE_TABLET | ORAL | Status: DC | PRN

## 2020-11-05 MED ORDER — IBUPROFEN 800 MG PO TABS
800.0000 mg | ORAL_TABLET | Freq: Four times a day (QID) | ORAL | Status: DC
  Administered 2020-11-06 – 2020-11-07 (×4): 800 mg via ORAL
  Filled 2020-11-05 (×4): qty 1

## 2020-11-05 MED ORDER — MORPHINE SULFATE (PF) 0.5 MG/ML IJ SOLN
INTRAMUSCULAR | Status: AC
  Filled 2020-11-05: qty 10

## 2020-11-05 MED ORDER — SIMETHICONE 80 MG PO CHEW
80.0000 mg | CHEWABLE_TABLET | ORAL | Status: DC
  Administered 2020-11-05 – 2020-11-06 (×2): 80 mg via ORAL
  Filled 2020-11-05 (×2): qty 1

## 2020-11-05 MED ORDER — DIBUCAINE (PERIANAL) 1 % EX OINT: 1.0000 "application " | TOPICAL_OINTMENT | CUTANEOUS | Status: DC | PRN

## 2020-11-05 MED ORDER — CEFAZOLIN SODIUM-DEXTROSE 2-4 GM/100ML-% IV SOLN
INTRAVENOUS | Status: AC
  Filled 2020-11-05: qty 100

## 2020-11-05 MED ORDER — SOD CITRATE-CITRIC ACID 500-334 MG/5ML PO SOLN
30.0000 mL | ORAL | Status: AC
  Administered 2020-11-05: 30 mL via ORAL

## 2020-11-05 MED ORDER — FENTANYL CITRATE (PF) 100 MCG/2ML IJ SOLN
INTRAMUSCULAR | Status: AC
  Filled 2020-11-05: qty 2

## 2020-11-05 MED ORDER — OXYCODONE HCL 5 MG/5ML PO SOLN: 5.0000 mg | Freq: Once | ORAL | Status: DC | PRN

## 2020-11-05 MED ORDER — NALOXONE HCL 0.4 MG/ML IJ SOLN: 0.4000 mg | INTRAMUSCULAR | Status: DC | PRN

## 2020-11-05 MED ORDER — OXYTOCIN-SODIUM CHLORIDE 30-0.9 UT/500ML-% IV SOLN: 2.5000 [IU]/h | INTRAVENOUS | Status: AC

## 2020-11-05 MED ORDER — KETOROLAC TROMETHAMINE 30 MG/ML IJ SOLN
30.0000 mg | Freq: Four times a day (QID) | INTRAMUSCULAR | Status: AC
  Administered 2020-11-05 – 2020-11-06 (×4): 30 mg via INTRAVENOUS
  Filled 2020-11-05 (×4): qty 1

## 2020-11-05 MED ORDER — CEFAZOLIN SODIUM-DEXTROSE 2-3 GM-%(50ML) IV SOLR
INTRAVENOUS | Status: DC | PRN
  Administered 2020-11-05: 2 g via INTRAVENOUS

## 2020-11-05 MED ORDER — SCOPOLAMINE 1 MG/3DAYS TD PT72
MEDICATED_PATCH | TRANSDERMAL | Status: AC
  Filled 2020-11-05: qty 1

## 2020-11-05 MED ORDER — ONDANSETRON HCL 4 MG/2ML IJ SOLN
INTRAMUSCULAR | Status: DC | PRN
  Administered 2020-11-05 (×2): 4 mg via INTRAVENOUS

## 2020-11-05 MED ORDER — NALOXONE HCL 4 MG/10ML IJ SOLN
1.0000 ug/kg/h | INTRAVENOUS | Status: DC | PRN
  Filled 2020-11-05: qty 5

## 2020-11-05 MED ORDER — SCOPOLAMINE 1 MG/3DAYS TD PT72: 1.0000 | MEDICATED_PATCH | Freq: Once | TRANSDERMAL | Status: DC

## 2020-11-05 MED ORDER — OXYTOCIN-SODIUM CHLORIDE 30-0.9 UT/500ML-% IV SOLN
INTRAVENOUS | Status: DC | PRN
  Administered 2020-11-05: 30 [IU] via INTRAVENOUS

## 2020-11-05 MED ORDER — BUPIVACAINE HCL (PF) 0.5 % IJ SOLN
INTRAMUSCULAR | Status: AC
  Filled 2020-11-05: qty 30

## 2020-11-05 MED ORDER — ZOLPIDEM TARTRATE 5 MG PO TABS: 5.0000 mg | ORAL_TABLET | Freq: Every evening | ORAL | Status: DC | PRN

## 2020-11-05 MED ORDER — LACTATED RINGERS IV SOLN: INTRAVENOUS | Status: DC | PRN

## 2020-11-05 MED ORDER — OXYCODONE HCL 5 MG PO TABS: 5.0000 mg | ORAL_TABLET | ORAL | Status: DC | PRN

## 2020-11-05 MED ORDER — MEPERIDINE HCL 25 MG/ML IJ SOLN: 6.2500 mg | INTRAMUSCULAR | Status: DC | PRN

## 2020-11-05 MED ORDER — PRENATAL MULTIVITAMIN CH
1.0000 | ORAL_TABLET | Freq: Every day | ORAL | Status: DC
  Administered 2020-11-06 – 2020-11-07 (×2): 1 via ORAL
  Filled 2020-11-05 (×2): qty 1

## 2020-11-05 MED ORDER — SCOPOLAMINE 1 MG/3DAYS TD PT72
MEDICATED_PATCH | TRANSDERMAL | Status: DC | PRN
  Administered 2020-11-05: 1 via TRANSDERMAL

## 2020-11-05 MED ORDER — SODIUM CHLORIDE 0.9% FLUSH: 3.0000 mL | INTRAVENOUS | Status: DC | PRN

## 2020-11-05 MED ORDER — KETOROLAC TROMETHAMINE 30 MG/ML IJ SOLN
INTRAMUSCULAR | Status: AC
  Filled 2020-11-05: qty 1

## 2020-11-05 MED ORDER — HYDROMORPHONE HCL 1 MG/ML IJ SOLN: 0.2500 mg | INTRAMUSCULAR | Status: DC | PRN

## 2020-11-05 MED ORDER — SODIUM CHLORIDE 0.9 % IR SOLN
Status: DC | PRN
  Administered 2020-11-05: 1

## 2020-11-05 MED ORDER — DIPHENHYDRAMINE HCL 25 MG PO CAPS: 25.0000 mg | ORAL_CAPSULE | Freq: Four times a day (QID) | ORAL | Status: DC | PRN

## 2020-11-05 MED ORDER — PROMETHAZINE HCL 25 MG/ML IJ SOLN: 6.2500 mg | INTRAMUSCULAR | Status: DC | PRN

## 2020-11-05 MED ORDER — DIPHENHYDRAMINE HCL 25 MG PO CAPS: 25.0000 mg | ORAL_CAPSULE | ORAL | Status: DC | PRN

## 2020-11-05 MED ORDER — KETOROLAC TROMETHAMINE 15 MG/ML IJ SOLN
15.0000 mg | INTRAMUSCULAR | Status: AC
  Administered 2020-11-05: 15 mg via INTRAVENOUS
  Filled 2020-11-05: qty 1

## 2020-11-05 MED ORDER — COCONUT OIL OIL: 1.0000 "application " | TOPICAL_OIL | Status: DC | PRN

## 2020-11-05 MED ORDER — MENTHOL 3 MG MT LOZG: 1.0000 | LOZENGE | OROMUCOSAL | Status: DC | PRN

## 2020-11-05 SURGICAL SUPPLY — 34 items
BARRIER ADHS 3X4 INTERCEED (GAUZE/BANDAGES/DRESSINGS) IMPLANT
BENZOIN TINCTURE PRP APPL 2/3 (GAUZE/BANDAGES/DRESSINGS) ×3 IMPLANT
CHLORAPREP W/TINT 26ML (MISCELLANEOUS) ×3 IMPLANT
CLAMP CORD UMBIL (MISCELLANEOUS) IMPLANT
CLOSURE STERI STRIP 1/2 X4 (GAUZE/BANDAGES/DRESSINGS) ×3 IMPLANT
CLOTH BEACON ORANGE TIMEOUT ST (SAFETY) ×3 IMPLANT
DRSG OPSITE POSTOP 4X10 (GAUZE/BANDAGES/DRESSINGS) ×3 IMPLANT
ELECT REM PT RETURN 9FT ADLT (ELECTROSURGICAL) ×3
ELECTRODE REM PT RTRN 9FT ADLT (ELECTROSURGICAL) ×1 IMPLANT
EXTRACTOR VACUUM KIWI (MISCELLANEOUS) IMPLANT
GLOVE BIO SURGEON STRL SZ 6.5 (GLOVE) ×2 IMPLANT
GLOVE BIO SURGEONS STRL SZ 6.5 (GLOVE) ×1
GLOVE BIOGEL PI IND STRL 7.0 (GLOVE) ×2 IMPLANT
GLOVE BIOGEL PI INDICATOR 7.0 (GLOVE) ×4
GOWN STRL REUS W/TWL LRG LVL3 (GOWN DISPOSABLE) ×6 IMPLANT
KIT ABG SYR 3ML LUER SLIP (SYRINGE) IMPLANT
NEEDLE HYPO 22GX1.5 SAFETY (NEEDLE) IMPLANT
NEEDLE HYPO 25X5/8 SAFETYGLIDE (NEEDLE) IMPLANT
NS IRRIG 1000ML POUR BTL (IV SOLUTION) ×3 IMPLANT
PACK C SECTION WH (CUSTOM PROCEDURE TRAY) ×3 IMPLANT
PAD OB MATERNITY 4.3X12.25 (PERSONAL CARE ITEMS) ×3 IMPLANT
PENCIL SMOKE EVAC W/HOLSTER (ELECTROSURGICAL) ×3 IMPLANT
RETRACTOR WND ALEXIS 25 LRG (MISCELLANEOUS) IMPLANT
RTRCTR WOUND ALEXIS 25CM LRG (MISCELLANEOUS)
SUT PLAIN 2 0 (SUTURE) ×2
SUT PLAIN ABS 2-0 CT1 27XMFL (SUTURE) ×1 IMPLANT
SUT VIC AB 0 CT1 36 (SUTURE) ×18 IMPLANT
SUT VIC AB 2-0 CT1 27 (SUTURE) ×2
SUT VIC AB 2-0 CT1 TAPERPNT 27 (SUTURE) ×1 IMPLANT
SUT VIC AB 4-0 PS2 27 (SUTURE) ×3 IMPLANT
SYR CONTROL 10ML LL (SYRINGE) IMPLANT
TOWEL OR 17X24 6PK STRL BLUE (TOWEL DISPOSABLE) ×3 IMPLANT
TRAY FOLEY W/BAG SLVR 14FR LF (SET/KITS/TRAYS/PACK) IMPLANT
WATER STERILE IRR 1000ML POUR (IV SOLUTION) ×3 IMPLANT

## 2020-11-05 NOTE — Transfer of Care (Signed)
Immediate Anesthesia Transfer of Care Note  Patient: Carolyn Blake  Procedure(s) Performed: CESAREAN SECTION (N/A )  Patient Location: PACU  Anesthesia Type:Spinal  Level of Consciousness: awake, alert  and oriented  Airway & Oxygen Therapy: Patient Spontanous Breathing  Post-op Assessment: Report given to RN and Post -op Vital signs reviewed and stable  Post vital signs: Reviewed and stable  Last Vitals:  Vitals Value Taken Time  BP    Temp    Pulse 76 11/05/20 1304  Resp 23 11/05/20 1304  SpO2 100 % 11/05/20 1304  Vitals shown include unvalidated device data.  Last Pain:  Vitals:   11/05/20 0953  TempSrc:   PainSc: 0-No pain         Complications: No complications documented.

## 2020-11-05 NOTE — Discharge Summary (Signed)
Postpartum Discharge Summary  Date of Service updated     Patient Name: Carolyn Blake DOB: 1990-08-17 MRN: [id number]  Date of admission: 11/05/2020 Delivery date:11/05/2020  Delivering provider: Woodroe Mode  Date of discharge: 11/07/2020  Admitting diagnosis: Cesarean delivery delivered [O82] Intrauterine pregnancy: [redacted]w[redacted]d    Secondary diagnosis:  Active Problems:   Supervision of high risk pregnancy, antepartum   H/O: cesarean section   Contraception, device intrauterine   Cesarean delivery delivered  Additional problems:    Discharge diagnosis: Term Pregnancy Delivered                                              Post partum procedures:post placental iud Augmentation: N/A Complications: None  Hospital course: Sceduled C/S   30y.o. yo G3P1011 at 311w0das admitted to the hospital 11/05/2020 for scheduled cesarean section with the following indication:Elective Repeat.Delivery details are as follows:  Membrane Rupture Time/Date: 12:11 PM ,11/05/2020   Delivery Method:C-Section, Low Transverse  Details of operation can be found in separate operative note.  Patient had an uncomplicated postpartum course.  She is ambulating, tolerating a regular diet, passing flatus, and urinating well. Patient is discharged home in stable condition on  11/07/20        Newborn Data: Birth date:11/05/2020  Birth time:12:11 PM  Gender:Female  Living status:Living  Apgars:6 ,8  Weight:2690 g   2690 grams  Magnesium Sulfate received: No BMZ received: No Rhophylac:N/A MMR:N/A T-DaP:Given prenatally Flu: Yes Transfusion:No  Physical exam  Vitals:   11/06/20 0130 11/06/20 0535 11/06/20 1530 11/07/20 0518  BP: 124/75 127/64 122/68 114/66  Pulse: 80 69 70 65  Resp: _0 Temp: 98.9 F (37.2 C) 98.9 F (37.2 C)  98.6 F (37 C)  TempSrc: Axillary Oral  Oral  SpO2:   100%   Weight:      Height:       General: alert, cooperative and no distress Lochia: appropriate Uterine  Fundus: firm Incision: Dressing is clean, dry, and intact DVT Evaluation: No evidence of DVT seen on physical exam. Labs: Lab Results  Component Value Date   WBC 13.2 (H) 11/06/2020   HGB 9.4 (L) 11/06/2020   HCT 29.2 (L) 11/06/2020   MCV 88.2 11/06/2020   PLT 170 11/06/2020   CMP Latest Ref Rng & Units 11/05/2020  Glucose 65 - 99 mg/dL -  BUN 6 - 20 mg/dL -  Creatinine 0.44 - 1.00 mg/dL 0.45  Sodium 135 - 145 mmol/L -  Potassium 3.5 - 5.1 mmol/L -  Chloride 101 - 111 mmol/L -  CO2 22 - 32 mmol/L -  Calcium 8.9 - 10.3 mg/dL -  Total Protein 6.5 - 8.1 g/dL -  Total Bilirubin 0.3 - 1.2 mg/dL -  Alkaline Phos 38 - 126 U/L -  AST 15 - 41 U/L -  ALT 14 - 54 U/L -   Edinburgh Score: Edinburgh Postnatal Depression Scale Screening Tool 11/07/2020  I have been able to laugh and see the funny side of things. 0  I have looked forward with enjoyment to things. 0  I have blamed myself unnecessarily when things went wrong. 0  I have been anxious or worried for no good reason. 1  I have felt scared or panicky for no good reason. 0  Things have been getting on top of me.  1  I have been so unhappy that I have had difficulty sleeping. 0  I have felt sad or miserable. 0  I have been so unhappy that I have been crying. 0  The thought of harming myself has occurred to me. 0  Edinburgh Postnatal Depression Scale Total 2     After visit meds:  Allergies as of 11/07/2020   No Known Allergies     Medication List    STOP taking these medications   Comfort Fit Maternity Supp Lg Misc     TAKE these medications   acetaminophen 500 MG tablet Commonly known as: TYLENOL Take 2 tablets (1,000 mg total) by mouth every 8 (eight) hours.   ibuprofen 800 MG tablet Commonly known as: ADVIL Take 1 tablet (800 mg total) by mouth every 6 (six) hours.   oxycodone-acetaminophen 2.5-325 MG tablet Commonly known as: Percocet Take 1 tablet by mouth every 4 (four) hours as needed for pain.   prenatal  vitamin w/FE, FA 27-1 MG Tabs tablet Take 1 tablet by mouth daily at 12 noon.   senna-docusate 8.6-50 MG tablet Commonly known as: Senokot-S Take 2 tablets by mouth daily. Start taking on: November 08, 2020   Tdap 5-2.5-18.5 LF-MCG/0.5 injection Commonly known as: BOOSTRIX Inject 0.5 mLs into the muscle once for 1 dose.   valACYclovir 1000 MG tablet Commonly known as: VALTREX Take 0.5 tablets (500 mg total) by mouth 2 (two) times daily. What changed: how much to take        Discharge home in stable condition Infant Feeding: Breast Infant Disposition:home with mother Discharge instruction: per After Visit Summary and Postpartum booklet. Activity: Advance as tolerated. Pelvic rest for 6 weeks.  Diet: routine diet Future Appointments:No future appointments. Follow up Visit:   Please schedule this patient for a In person postpartum visit in 6 weeks with the following provider: Any provider. Additional Postpartum F/U:Incision check 1 week, postpartum pap at 6 weeks  Low risk pregnancy complicated by: repeat c section Delivery mode:  C-Section, Low Transverse  Anticipated Birth Control:  IUD   11/07/2020 Starr Lake, CNM

## 2020-11-05 NOTE — Anesthesia Procedure Notes (Signed)
Spinal  Patient location during procedure: OB Start time: 11/05/2020 11:44 AM End time: 11/05/2020 11:49 AM Staffing Performed: anesthesiologist  Anesthesiologist: Lynda Rainwater, MD Preanesthetic Checklist Completed: patient identified, IV checked, risks and benefits discussed, surgical consent, monitors and equipment checked, pre-op evaluation and timeout performed Spinal Block Patient position: sitting Prep: DuraPrep and site prepped and draped Patient monitoring: heart rate, cardiac monitor, continuous pulse ox and blood pressure Approach: midline Location: L3-4 Injection technique: single-shot Needle Needle type: Pencan  Needle gauge: 24 G Needle length: 10 cm Assessment Sensory level: T4

## 2020-11-05 NOTE — Op Note (Signed)
Carolyn Blake PROCEDURE DATE: 11/05/2020  PREOPERATIVE DIAGNOSES: Intrauterine pregnancy at 46w0dweeks gestation; patient declines vag del attempt, encounter for contraception   POSTOPERATIVE DIAGNOSES: The same  PROCEDURE: Repeat Low Transverse Cesarean Section  SURGEON:  Dr. ARoselie Awkward ASSISTANT:  Dr. MBerniece Andreas  ANESTHESIOLOGY TEAM: Anesthesiologist: MLynda Rainwater MD CRNA: RRayvon Char CRNA  INDICATIONS: CSkyrah Kruppis a 30y.o. G3P1011 at 332w0dere for cesarean section secondary to the indications listed under preoperative diagnoses; please see preoperative note for further details.  The risks of surgery were discussed with the patient including but were not limited to: bleeding which may require transfusion or reoperation; infection which may require antibiotics; injury to bowel, bladder, ureters or other surrounding organs; injury to the fetus; need for additional procedures including hysterectomy in the event of a life-threatening hemorrhage; formation of adhesions; placental abnormalities wth subsequent pregnancies; incisional problems; thromboembolic phenomenon and other postoperative/anesthesia complications.  The patient concurred with the proposed plan, giving informed written consent for the procedure.    FINDINGS:  Viable female infant in cephalic presentation.  Apgars 6 and 8.  Clear amniotic fluid.  Intact placenta, three vessel cord.  Normal uterus, fallopian tubes and ovaries bilaterally.  ANESTHESIA: Spinal INTRAVENOUS FLUIDS: 2300 ml   ESTIMATED BLOOD LOSS: 400 ml URINE OUTPUT:  100 ml SPECIMENS: Placenta sent to L&D COMPLICATIONS: None immediate  PROCEDURE IN DETAIL:  The patient preoperatively received intravenous antibiotics and had sequential compression devices applied to her lower extremities.  She was then taken to the operating room where spinal anesthesia was administered and was found to be adequate. She was then placed in a dorsal supine  position with a leftward tilt, and prepped and draped in a sterile manner.  A foley catheter was placed into her bladder and attached to constant gravity.  After an adequate timeout was performed, a Pfannenstiel skin incision was made with scalpel on her preexisting scarand carried through to the underlying layer of fascia. The fascia was incised in the midline, and this incision was extended bilaterally using the Mayo scissors.  Kocher clamps were applied to the superior aspect of the fascial incision and the underlying rectus muscles were dissected off bluntly and sharply.  A similar process was carried out on the inferior aspect of the fascial incision. The rectus muscles were separated in the midline and the peritoneum was entered bluntly. The Alexis self-retaining retractor was introduced into the abdominal cavity.  Attention was turned to the lower uterine segment where a low transverse hysterotomy was made with a scalpel and extended bilaterally bluntly.  The infant was successfully delivered, the cord was clamped and cut after one minute, and the infant was handed over to the awaiting neonatology team. Uterine massage was then administered, and the placenta delivered intact with a three-vessel cord. The uterus was then cleared of clots and debris.    A post placental Liletta IUD was inserted into the hysterotomy and placed in the uterine fundus. The strings were tucked into the cervix with a ring forcep.   The hysterotomy was closed with 0 Vicryl in a running locked fashion, and an imbricating layer was also placed with 0 Vicryl. The pelvis was cleared of all clot and debris. Hemostasis was confirmed on all surfaces.  The retractor was removed.  The peritoneum was closed with a 0 Vicryl running stitch. The fascia was then closed using 0 Vicryl in a running fashion.  The subcutaneous layer was irrigated, reapproximated with 2-0 plain gut running stitches,  and the skin was closed with a 4-0 Vicryl  subcuticular stitch. The patient tolerated the procedure well. Sponge, instrument and needle counts were correct x 3.  She was taken to the recovery room in stable condition.   [place] Lot #:[id number] Expiration Date: 02/01/24  Sharene Skeans, MD [facility] Family Medicine Fellow, [facility] for [employer], [place]

## 2020-11-05 NOTE — Anesthesia Preprocedure Evaluation (Signed)
Anesthesia Evaluation  Patient identified by MRN, date of birth, ID band Patient awake    Reviewed: Allergy & Precautions, NPO status , Patient's Chart, lab work & pertinent test results  Airway Mallampati: II  TM Distance: >3 FB Neck ROM: Full    Dental no notable dental hx. (+) Teeth Intact   Pulmonary neg pulmonary ROS,    Pulmonary exam normal breath sounds clear to auscultation       Cardiovascular negative cardio ROS Normal cardiovascular exam Rhythm:Regular Rate:Normal     Neuro/Psych  Headaches, negative psych ROS   GI/Hepatic Neg liver ROS, PUD, Hx/o Ulcerative colitis   Endo/Other  Obesity  Renal/GU negative Renal ROS  negative genitourinary   Musculoskeletal   Abdominal (+) + obese,   Peds  Hematology  (+) Sickle cell trait and anemia ,   Anesthesia Other Findings   Reproductive/Obstetrics (+) Pregnancy Fetal intolerance to labor                             Anesthesia Physical  Anesthesia Plan  ASA: II  Anesthesia Plan: Spinal   Post-op Pain Management:    Induction:   PONV Risk Score and Plan: 2 and Treatment may vary due to age or medical condition  Airway Management Planned: Natural Airway  Additional Equipment:   Intra-op Plan:   Post-operative Plan:   Informed Consent: I have reviewed the patients History and Physical, chart, labs and discussed the procedure including the risks, benefits and alternatives for the proposed anesthesia with the patient or authorized representative who has indicated his/her understanding and acceptance.       Plan Discussed with: CRNA, Anesthesiologist and Surgeon  Anesthesia Plan Comments:         Anesthesia Quick Evaluation

## 2020-11-05 NOTE — Anesthesia Postprocedure Evaluation (Signed)
Anesthesia Post Note  Patient: Carolyn Blake  Procedure(s) Performed: CESAREAN SECTION (N/A )     Patient location during evaluation: PACU Anesthesia Type: Spinal Level of consciousness: awake and alert Pain management: pain level controlled Vital Signs Assessment: post-procedure vital signs reviewed and stable Respiratory status: spontaneous breathing, nonlabored ventilation and respiratory function stable Cardiovascular status: blood pressure returned to baseline and stable Postop Assessment: no apparent nausea or vomiting Anesthetic complications: no   No complications documented.  Last Vitals:  Vitals:   11/05/20 1400 11/05/20 1430  BP: 119/75 134/75  Pulse: 68 71  Resp: (!) 21 18  Temp:  36.6 C  SpO2: 100%     Last Pain:  Vitals:   11/05/20 1430  TempSrc: Axillary  PainSc:    Pain Goal:      LLE Sensation: Tingling (11/05/20 1400)   RLE Sensation: Tingling (11/05/20 1400)     Epidural/Spinal Function Cutaneous sensation: Tingles (11/05/20 1400), Patient able to flex knees: Yes (11/05/20 1400), Patient able to lift hips off bed: No (11/05/20 1400), Back pain beyond tenderness at insertion site: No (11/05/20 1400), Progressively worsening motor and/or sensory loss: No (11/05/20 1400), Bowel and/or bladder incontinence post epidural: No (11/05/20 1400)  Lynda Rainwater

## 2020-11-05 NOTE — H&P (Signed)
Obstetric Preoperative History and Physical  Topanga Alvelo is a 30 y.o. G3P1011 with IUP at 13w0dpresenting for scheduled cesarean section.  Reports good fetal movement, no bleeding, no contractions, no leaking of fluid.  No acute preoperative concerns.    Cesarean Section Indication: patient declines vag del attempt  Prenatal Course Source of Care: [facility]   Pregnancy complications or risks: Patient Active Problem List   Diagnosis Date Noted  . [redacted] weeks gestation of pregnancy 10/31/2020  . H/O: cesarean section 08/25/2020  . HSIL (high grade squamous intraepithelial lesion) on Pap smear of cervix 06/24/2020  . Supervision of high risk pregnancy, antepartum 04/20/2020  . Irregular heart beat   . History of sexually transmitted disease (CT and trich) 10/31/2016  . [place]/03[place]  . History of PCR DNA positive for HSV2 10/03/2016  . [place]le cell trait ([place] 10/03/2016  . Anemia 11/22/2015   She plans to breastfeed She desires IUD for postpartum contraception.   Prenatal labs and studies: ABO, Rh: --/--/O POS (11/04 0930) Antibody: NEG (11/04 0930) Rubella: 1.59 (04/26 1226) RPR: NON REACTIVE (11/04 1000)  HBsAg: Negative (04/26 1226)  HIV: Non Reactive (08/26 0836)  GBRA:XENMMHWK/- (10/18 1355) 2 hr Glucola  normal Genetic screening normal Anatomy UK[place]ormal  Prenatal Transfer Tool  Maternal Diabetes: No Genetic Screening: Normal Maternal Ultrasounds/Referrals: Normal Fetal Ultrasounds or other Referrals:  None Maternal Substance Abuse:  No Significant Maternal Medications:  None Significant Maternal Lab Results: None  Past Medical History:  Diagnosis Date  . Anemia   . Headache   . HSV-2 (herpes simplex virus 2) infection   . Irregular heart beat   . Keratoconus   . Ulcerative colitis ([facility]     Past Surgical History:  Procedure Laterality Date  . CESAREAN SECTION N/A 04/21/2017   Procedure: CESAREAN SECTION;  Surgeon: UOsborne Oman MD;   [place] [facility]  Service: Obstetrics;  Laterality: N/A;  . NO PAST SURGERIES      OB History  Gravida Para Term Preterm AB Living  3 1 1  0 1 1  SAB TAB Ectopic Multiple Live Births  0 0 0 0 1    # Outcome Date GA Lbr Len/2nd Weight Sex Delivery Anes PTL Lv  3 Current           2 AB 01/2020          1 Term 04/21/17 [id number] g M CS-LTranv EPI  LIV     Birth Comments: IOL postdates; [facility]  Social History   Socioeconomic History  . Marital status: Single    Spouse name: Not on file  . Number of children: Not on file  . Years of education: Not on file  . Highest education level: Not on file  Occupational History  . Not on file  Tobacco Use  . Smoking status: Never Smoker  . Smokeless tobacco: Never Used  Vaping Use  . Vaping Use: Never used  Substance and Sexual Activity  . Alcohol use: No  . Drug use: No  . Sexual activity: Yes    Birth control/protection: None  Other Topics Concern  . Not on file  Social History Narrative  . Not on file   Social Determinants of Health   Financial Resource Strain:   . Difficulty of Paying Living Expenses: Not on file  Food Insecurity: No Food Insecurity  . Worried About RuCharity fundraisern the Last Year: Never true  .  Ran Out of Food in the Last Year: Never true  Transportation Needs: No Transportation Needs  . Lack of Transportation (Medical): No  . Lack of Transportation (Non-Medical): No  Physical Activity:   . Days of Exercise per Week: Not on file  . Minutes of Exercise per Session: Not on file  Stress:   . Feeling of Stress : Not on file  Social Connections:   . Frequency of Communication with Friends and Family: Not on file  . Frequency of Social Gatherings with Friends and Family: Not on file  . Attends Religious Services: Not on file  . Active Member of Clubs or Organizations: Not on file  . Attends Archivist Meetings: Not on file  . Marital Status: Not on file     Family History  Problem Relation Age of Onset  . Heart disease Father   . Diabetes Father   . Peripheral Artery Disease Father   . Liver disease Mother     Medications Prior to Admission  Medication Sig Dispense Refill Last Dose  . prenatal vitamin w/FE, FA (PRENATAL 1 + 1) 27-1 MG TABS tablet Take 1 tablet by mouth daily at 12 noon. 30 tablet 11   . valACYclovir (VALTREX) 1000 MG tablet Take 0.5 tablets (500 mg total) by mouth 2 (two) times daily. (Patient taking differently: Take 250 mg by mouth 2 (two) times daily. ) 60 tablet 2   . Elastic Bandages & Supports (COMFORT FIT MATERNITY SUPP LG) MISC 1 Units by Does not apply route daily as needed. 1 each 0     No Known Allergies  Review of Systems: Pertinent items noted in HPI and remainder of comprehensive ROS otherwise negative.  Physical Exam: Temp 98.4 F (36.9 C) (Oral)   Resp 18   Ht 5' 4"  (1.626 m)   Wt 94.1 kg   LMP 02/06/2020   BMI 35.60 kg/m  FHR by Doppler: 140 bpm CONSTITUTIONAL: Well-developed, well-nourished female in no acute distress.  HENT:  Normocephalic, atraumatic, External right and left ear normal. Oropharynx is clear and moist EYES: Conjunctivae and EOM are normal. No scleral icterus.  NECK: Normal range of motion, supple, no masses SKIN: Skin is warm and dry. No rash noted. Not diaphoretic. No erythema. No pallor. [place]: Alert and oriented to person, place, and time. Normal reflexes, muscle tone coordination. No cranial nerve deficit noted. PSYCHIATRIC: Normal mood and affect. Normal behavior. Normal judgment and thought content. CARDIOVASCULAR: Normal heart rate noted RESPIRATORY: Effort and breath sounds normal, no problems with respiration noted ABDOMEN: Soft, nontender, nondistended, gravid. Well-healed Pfannenstiel incision. PELVIC: Deferred MUSCULOSKELETAL: Normal range of motion. No edema and no tenderness. 2+ distal pulses.   Pertinent Labs/Studies:   Results for orders placed or  performed during the hospital encounter of 11/03/20 (from the past 72 hour(s))  SARS CORONAVIRUS 2 (TAT 6-24 HRS) Nasopharyngeal Nasopharyngeal Swab     Status: None   Collection Time: 11/03/20 10:43 AM   Specimen: Nasopharyngeal Swab  Result Value Ref Range   SARS Coronavirus 2 NEGATIVE NEGATIVE    Comment: (NOTE) SARS-CoV-2 target nucleic acids are NOT DETECTED.  The SARS-CoV-2 RNA is generally detectable in upper and lower respiratory specimens during the acute phase of infection. Negative results do not preclude SARS-CoV-2 infection, do not rule out co-infections with other pathogens, and should not be used as the sole basis for treatment or other patient management decisions. Negative results must be combined with clinical observations, patient history, and epidemiological information. The expected  result is Negative.  Fact Sheet for Patients: SugarRoll.be  Fact Sheet for Healthcare Providers: https://www.woods-mathews.com/  This test is not yet approved or cleared by the [place] FDA and  has been authorized for detection and/or diagnosis of SARS-CoV-2 by FDA under an Emergency Use Authorization (EUA). This EUA will remain  in effect (meaning this test can be used) for the duration of the COVID-19 declaration under Se ction 564(b)(1) of the Act, 21 U.S.C. section 360bbb-3(b)(1), unless the authorization is terminated or revoked sooner.  Performed at [facility], [place] [address] [place], [place] [address]     Assessment and Plan: Maloni Musleh is a 30 y.o. G3P1011 at 47w0dbeing admitted for scheduled cesarean section. The risks of cesarean section discussed with the patient included but were not limited to: bleeding which may require transfusion or reoperation; infection which may require antibiotics; injury to bowel, bladder, ureters or other surrounding organs; injury to the fetus; need for additional procedures  including hysterectomy in the event of a life-threatening hemorrhage; placental abnormalities with subsequent pregnancies, incisional problems, thromboembolic phenomenon and other postoperative/anesthesia complications. The patient concurred with the proposed plan, giving informed written consent for the procedure. Patient has been NPO since last night she will remain NPO for procedure. Anesthesia and OR aware. Preoperative prophylactic antibiotics and SCDs ordered on call to the OR. To OR when ready.   Pregnancy Complications:   -hx of ulcerative colitis, not on meds   -sickle cell trait   -HSIL on pap, will need pap at pp visit   Contraception: post placental liletta (consented at bedside and ordered) Circumcision: Yes   JSharene Skeans MD [facility]il[facility] And Medical CentersFamily Medicine Fellow, [facility] Centerfor [facility] [place]

## 2020-11-06 LAB — CBC
HCT: 29.2 % — ABNORMAL LOW (ref 36.0–46.0)
Hemoglobin: 9.4 g/dL — ABNORMAL LOW (ref 12.0–15.0)
MCH: 28.4 pg (ref 26.0–34.0)
MCHC: 32.2 g/dL (ref 30.0–36.0)
MCV: 88.2 fL (ref 80.0–100.0)
Platelets: 170 10*3/uL (ref 150–400)
RBC: 3.31 MIL/uL — ABNORMAL LOW (ref 3.87–5.11)
RDW: 15.3 % (ref 11.5–15.5)
WBC: 13.2 10*3/uL — ABNORMAL HIGH (ref 4.0–10.5)
nRBC: 0 % (ref 0.0–0.2)

## 2020-11-06 MED ORDER — SODIUM CHLORIDE 0.9 % IV SOLN
510.0000 mg | Freq: Once | INTRAVENOUS | Status: AC
  Administered 2020-11-06: 510 mg via INTRAVENOUS
  Filled 2020-11-06: qty 17

## 2020-11-06 NOTE — Progress Notes (Signed)
POSTPARTUM PROGRESS NOTE  Subjective: Carolyn Blake is a 30 y.o. K3C3818 s/p rLTCS at [redacted]w[redacted]d  She reports she doing well. No acute events overnight. She denies any problems with ambulating, voiding or po intake. Denies nausea or vomiting. She has passed flatus. Pain is well controlled.  Lochia is decreasing and without large clots.  Objective: Blood pressure 127/64, pulse 69, temperature 98.9 F (37.2 C), temperature source Oral, resp. rate 16, height 5' 4"  (1.626 m), weight 94.1 kg, last menstrual period 02/06/2020, SpO2 99 %, unknown if currently breastfeeding.  Physical Exam:  General: alert, cooperative and no distress Chest: no respiratory distress Abdomen: soft, non-tender  Uterine Fundus: firm and at level of umbilicus Extremities: No calf swelling or tenderness  no edema  Recent Labs    11/05/20 1531 11/06/20 0426  HGB 11.1* 9.4*  HCT 34.5* 29.2*    Assessment/Plan: Carolyn Leachmanis a 30y.o. [id number]/p rLTCS at 356w0d Routine Postpartum Care: Doing well, pain well-controlled.  -- Continue routine care, lactation support  -- Contraception: PP Liletta, placed -- Feeding: breast  Dispo: Plan for discharge PPD#2 or PPD#3.  CaEzequiel EssexMD 11/06/2020 9:22 AM   Attestation of Supervision of Student:  I confirm that I have verified the information documented in the resident student's note and that I have also personally reperformed the history, physical exam and all medical decision making activities.  I have verified that all services and findings are accurately documented in this student's note; and I agree with management and plan as outlined in the documentation. I have also made any necessary editorial changes.  Will give IV feraheme since patient still has IV access at this time. She denies any dizziness or lightheadedness but could benefit due to significant drop from baseline. Patient verbalized understanding.   CaWende MottCNWaterfordor WoThe Mosaic CompanyCoNewburgh Heightsroup 11/06/2020 9:28 AM

## 2020-11-07 LAB — TYPE AND SCREEN
ABO/RH(D): O POS
Antibody Screen: NEGATIVE
Unit division: 0
Unit division: 0

## 2020-11-07 LAB — BPAM RBC
Blood Product Expiration Date: 202112062359
Blood Product Expiration Date: 202112062359
Unit Type and Rh: 5100
Unit Type and Rh: 5100

## 2020-11-07 MED ORDER — IBUPROFEN 800 MG PO TABS: 800.0000 mg | ORAL_TABLET | Freq: Four times a day (QID) | ORAL | 0 refills | Status: DC

## 2020-11-07 MED ORDER — OXYCODONE-ACETAMINOPHEN 2.5-325 MG PO TABS
1.0000 | ORAL_TABLET | ORAL | 0 refills | Status: DC | PRN
Start: 2020-11-07 — End: 2020-12-09

## 2020-11-07 MED ORDER — TETANUS-DIPHTH-ACELL PERTUSSIS 5-2.5-18.5 LF-MCG/0.5 IM SUSY: 0.5000 mL | PREFILLED_SYRINGE | Freq: Once | INTRAMUSCULAR | 0 refills | Status: AC

## 2020-11-07 MED ORDER — ACETAMINOPHEN 500 MG PO TABS: 1000.0000 mg | ORAL_TABLET | Freq: Three times a day (TID) | ORAL | 0 refills | Status: DC

## 2020-11-07 MED ORDER — SENNOSIDES-DOCUSATE SODIUM 8.6-50 MG PO TABS: 2.0000 | ORAL_TABLET | ORAL | 0 refills | Status: DC

## 2020-11-07 NOTE — Progress Notes (Addendum)
POSTPARTUM PROGRESS NOTE  Post Partum Day 2.  Subjective:  Carolyn Blake is a 30 y.o. N3Y0511 s/p rTLCS at [redacted]w[redacted]d  No acute events overnight.  Pt denies problems with ambulating, voiding or po intake.  She denies nausea or vomiting.  Pain is well controlled.  She has had flatus. She has not had bowel movement but is taking stool softner.  Lochia Minimal. Feraheme administered yesterday @ 1238.   Objective: Blood pressure 114/66, pulse 65, temperature 98.6 F (37 C), temperature source Oral, resp. rate 18, height 5' 4"  (1.626 m), weight 94.1 kg, last menstrual period 02/06/2020, SpO2 100 %, unknown if currently breastfeeding.  Physical Exam:  General: alert, cooperative and no distress Chest: no respiratory distress Heart:regular rate, distal pulses intact Abdomen: soft, nontender Uterine Fundus: firm, appropriately tender DVT Evaluation: No calf swelling or tenderness Extremities: trace edema Skin: warm, dry; incision dressing intact with minimal blood tinged fluid  Recent Labs    11/05/20 1531 11/06/20 0426  HGB 11.1* 9.4*  HCT 34.5* 29.2*    Assessment/Plan: Carolyn Blake a 30y.o. GM2T1173s/p rLTCS at 322w0d PPD#2 - Doing well and wanting to go home Contraception: ppLiletta placed Feeding: breast Dispo: Plan for discharge home today.   LOS: 2 days   BaLandry Corporal11/07/2020, 7:48 AM    CNM attestation:  I have seen and examined this patient and agree with above documentation in the PAs note.   Carolyn Cordials a [age].o. G3P2012 at 3920w0dporting no pain, eager to go home.    PE: Patient Vitals for the past 24 hrs:  BP Temp Temp src Pulse Resp SpO2  11/07/20 0518 114/66 98.6 F (37 C) Oral 65 18 --  11/06/20 1530 122/68 -- -- 70 16 100 %   Gen: calm comfortable, NAD Resp: normal effort, no distress Heart: Regular rate Abd: Soft, NT,dressing clean dry and intact   Assessment:  Post op day # 2, stable for discharge.  Plan: -  Discharge home in stable condition.  KooStarr LakeNM 11/07/2020 10:19 AM

## 2020-11-11 ENCOUNTER — Ambulatory Visit (INDEPENDENT_AMBULATORY_CARE_PROVIDER_SITE_OTHER): Payer: Medicaid Other | Admitting: *Deleted

## 2020-11-11 ENCOUNTER — Encounter: Payer: Self-pay | Admitting: *Deleted

## 2020-11-11 ENCOUNTER — Other Ambulatory Visit: Payer: Self-pay

## 2020-11-11 VITALS — BP 125/76 | HR 70 | Ht 64.0 in | Wt 185.0 lb

## 2020-11-11 DIAGNOSIS — Z4889 Encounter for other specified surgical aftercare: Secondary | ICD-10-CM

## 2020-11-11 NOTE — Progress Notes (Signed)
Chart reviewed for nurse visit. Agree with plan of care.   Clarnce Flock, MD 11/11/20 11:01 AM

## 2020-11-11 NOTE — Progress Notes (Signed)
Pt presents for incision check following C/S on 11/6. She reports mild incisional pain @ times which is relieved mostly by Tylenol and occasional Oxycodone. Pt was observed to be walking without difficulty. She was advised that she may use ibuprofen which may reduce need for oxycodone. Honeycomb dressing was removed as well as some steristrips which were wet. Incision was found to be healing well. No redness, swelling or drainage was observed. Proper daily cleansing of the surgical site was explained. She voiced understanding of all information and instructions given. PP appt is scheduled on 12/10.

## 2020-12-09 ENCOUNTER — Other Ambulatory Visit: Payer: Self-pay

## 2020-12-09 ENCOUNTER — Other Ambulatory Visit (HOSPITAL_COMMUNITY)
Admission: RE | Admit: 2020-12-09 | Discharge: 2020-12-09 | Disposition: A | Discharge status: Home / Self Care | Payer: BLUE CROSS/BLUE SHIELD | Source: Ambulatory Visit | Attending: Obstetrics & Gynecology | Admitting: Obstetrics & Gynecology

## 2020-12-09 ENCOUNTER — Ambulatory Visit (INDEPENDENT_AMBULATORY_CARE_PROVIDER_SITE_OTHER): Payer: BLUE CROSS/BLUE SHIELD | Admitting: Obstetrics & Gynecology

## 2020-12-09 ENCOUNTER — Encounter: Payer: Self-pay | Admitting: Obstetrics & Gynecology

## 2020-12-09 VITALS — BP 117/73 | HR 70 | Wt 172.7 lb

## 2020-12-09 DIAGNOSIS — R87810 Cervical high risk human papillomavirus (HPV) DNA test positive: Secondary | ICD-10-CM

## 2020-12-09 DIAGNOSIS — Z975 Presence of (intrauterine) contraceptive device: Secondary | ICD-10-CM

## 2020-12-09 DIAGNOSIS — R87613 High grade squamous intraepithelial lesion on cytologic smear of cervix (HGSIL): Secondary | ICD-10-CM | POA: Diagnosis not present

## 2020-12-09 DIAGNOSIS — Z98891 History of uterine scar from previous surgery: Secondary | ICD-10-CM

## 2020-12-09 DIAGNOSIS — L24A9 Irritant contact dermatitis due friction or contact with other specified body fluids: Secondary | ICD-10-CM | POA: Diagnosis not present

## 2020-12-09 DIAGNOSIS — K5190 Ulcerative colitis, unspecified, without complications: Secondary | ICD-10-CM

## 2020-12-09 MED ORDER — SULFAMETHOXAZOLE-TRIMETHOPRIM 800-160 MG PO TABS: 1.0000 | ORAL_TABLET | Freq: Two times a day (BID) | ORAL | 0 refills | Status: DC

## 2020-12-09 NOTE — Progress Notes (Signed)
    [place] Partum Visit Note  Carolyn Blake is a 30 y.o. G12P2012 female who presents for a postpartum visit. She is 4 weeks postpartum following a repeat cesarean section.  I have fully reviewed the prenatal and intrapartum course. The delivery was at 63 0/7 gestational weeks due to prior cesarean section.  Anesthesia: spinal. Postpartum course has been without abnormal findings. Baby is doing well. Baby is feeding by bottle - Carolyn Blake . Bleeding staining only. Bowel function is normal. Bladder function is normal. Patient is not sexually active. Contraception method is IUD. Postpartum depression screening: negative.  The pregnancy intention screening data noted above was reviewed.   The following portions of the patient's history were reviewed and updated as appropriate: allergies, current medications, past family history, past medical history, past social history, past surgical history and problem list.  Review of Systems burning along right side of her incision    Objective:  LMP 02/06/2020    General:  alert, cooperative and no distress   Breasts:  inspection negative, no nipple discharge or bleeding, no masses or nodularity palpable  Lungs: clear to auscultation bilaterally  Heart:  regular rate and rhythm, S1, S2 normal, no murmur, click, rub or gallop  Abdomen: soft, non-tender; bowel sounds normal; no masses,  no organomegaly and Inc with superficial drainage at four or five separate locations, sutures present in four locations that were excised, culture obtained   Vulva:  normal  Vagina: normal vagina  Cervix:  no lesions and IUD string noted in cervix  Corpus: enlarged, 8 weeks size  Adnexa:  no mass, fullness, tenderness  Rectal Exam: deferred        Assessment:    ~5 weeks postpartum exam. Pap smear done at today's visit.   Plan:   Essential components of care per ACOG recommendations:  1.  Mood and well being: Patient with negative depression screening today.  Reviewed local resources for support.  - Patient does not use tobacco.   2. Infant care and feeding:  -Patient currently breastmilk feeding? No  -Social determinants of health (SDOH) reviewed in EPIC. No concerns identified.  3. Sexuality, contraception and birth spacing - Patient does not want a pregnancy in the next year.   - Reviewed forms of contraception in tiered fashion. IUD placed with cesarean [place]) - Discussed birth spacing of 18 months  4. Sleep and fatigue -Encouraged family/partner/community support of 4 hrs of uninterrupted sleep to help with mood and fatigue  5. Physical Recovery  - Discussed patients delivery and complications including drainage from incision today - Patient is not safe to resume physical and sexual activity--will recheck 1 week.  6.  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  Colpo 06/2020 with lesions.  No biopsy.  Pap and HR HPV obtained today.  7. Ulcerative colitis - Dr. Carol Ada is her GI  M. Edwinna Areola, MD [facility] [place] [place]

## 2020-12-13 LAB — CYTOLOGY - PAP
Comment: NEGATIVE
Diagnosis: HIGH — AB
High risk HPV: POSITIVE — AB

## 2020-12-14 ENCOUNTER — Telehealth: Payer: Self-pay | Admitting: Lactation Services

## 2020-12-14 NOTE — Telephone Encounter (Signed)
Called patient with results. She was aware of results and need for Colposcopy. Questions answered about Colposcopy. Patient with no other questions or concerns at this time.

## 2020-12-14 NOTE — Telephone Encounter (Signed)
-----   Message from Hollice Gong, [place] sent at 12/13/2020  2:39 PM EST ----- Regarding: Pap results  ----- Message ----- From: Megan Salon, MD Sent: 12/13/2020   1:30 PM EST To: [facility] [place] [facility]  Please let pt know her pap still showed HGSIL cells and was positive for high risk HPV.  Needs colposcopy scheduled.  Thank you.

## 2020-12-16 ENCOUNTER — Other Ambulatory Visit: Payer: Self-pay

## 2020-12-16 ENCOUNTER — Ambulatory Visit (INDEPENDENT_AMBULATORY_CARE_PROVIDER_SITE_OTHER): Payer: Medicaid Other | Admitting: *Deleted

## 2020-12-16 ENCOUNTER — Encounter: Payer: Self-pay | Admitting: Family Medicine

## 2020-12-16 VITALS — BP 106/57 | HR 67 | Ht 64.0 in | Wt 170.6 lb

## 2020-12-16 DIAGNOSIS — Z5189 Encounter for other specified aftercare: Secondary | ICD-10-CM

## 2020-12-16 LAB — WOUND CULTURE

## 2020-12-16 NOTE — Progress Notes (Signed)
Here for wound check. Had postpartum visit 12/09/20 and instructed to come back for wound check today before decision to go back to work.  Wound clean, dry, intact without redness, drainage, edema. 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 She had a c/s 11/05/20. May go back to work with work restriction of 15-20 pounds until 12 weeks postpartum which would be 01/28/21.  Tanea Moga,RN

## 2021-01-02 ENCOUNTER — Other Ambulatory Visit: Payer: Self-pay

## 2021-01-02 ENCOUNTER — Encounter: Payer: Self-pay | Admitting: Obstetrics & Gynecology

## 2021-01-02 ENCOUNTER — Other Ambulatory Visit (HOSPITAL_COMMUNITY)
Admission: RE | Admit: 2021-01-02 | Discharge: 2021-01-02 | Disposition: A | Discharge status: Home / Self Care | Payer: BLUE CROSS/BLUE SHIELD | Source: Ambulatory Visit | Attending: Obstetrics & Gynecology | Admitting: Obstetrics & Gynecology

## 2021-01-02 ENCOUNTER — Ambulatory Visit (INDEPENDENT_AMBULATORY_CARE_PROVIDER_SITE_OTHER): Payer: BLUE CROSS/BLUE SHIELD | Admitting: Obstetrics & Gynecology

## 2021-01-02 VITALS — BP 119/67 | HR 56 | Wt 173.0 lb

## 2021-01-02 DIAGNOSIS — R87613 High grade squamous intraepithelial lesion on cytologic smear of cervix (HGSIL): Secondary | ICD-10-CM | POA: Diagnosis present

## 2021-01-02 DIAGNOSIS — Z3202 Encounter for pregnancy test, result negative: Secondary | ICD-10-CM

## 2021-01-02 LAB — POCT PREGNANCY, URINE: Preg Test, Ur: NEGATIVE

## 2021-01-02 NOTE — Progress Notes (Signed)
31 y.o. Single Not Hispanic or Latino female here for colposcopy with possible biopsies and/or ECC due to HGSIL Pap obtained at post partum appointment on 12/09/2020.   No LMP recorded. (Menstrual status: IUD).          Sexually active: Yes.    The current method of family planning is IUD.     Patient has been counseled about results and procedure.  Risks and benefits have bene reviewed including immediate and/or delayed bleeding, infection, cervical scaring from procedure, possibility of needing additional follow up as well as treatment.  rare risks of missing a lesion discussed as well.  All questions answered.  Pt ready to proceed.  BP 119/67   Pulse (!) 56   Wt 173 lb (78.5 kg)   BMI 29.70 kg/m   Physical Exam Exam conducted with a chaperone present.  Constitutional:      Appearance: Normal appearance.  Genitourinary:    General: Normal vulva.     Neurological:     General: No focal deficit present.     Mental Status: She is alert.  Psychiatric:        Mood and Affect: Mood normal.     Speculum placed.  3% acetic acid applied to cervix for >45 seconds.  Cervix visualized with both 7.5X and 15X magnification.  Green filter also used.  Lugols solution was used.  Findings:  AWE noted at 3 o'clock with mosacism and AWE at 11-1 o'clock.  Biopsy:  Obtained at 3 and 12 o'clock positions.  ECC:  was performed.  Monsel's was needed.  There was significant bleeding from the 3 o'clock position. Monsel 's solution was needed prior to [facility] so there is likely some monsels in the [facility] specimen.  This was noted on the pathology report.  Excellent hemostasis was present.  Pt tolerated procedure well and all instruments were removed.  Findings noted above on picture of cervix.  Chaperone, Blima Singer, RN, was present during procedure.  Assessment/Plan: 1. HGSIL on Pap smear of cervix - Surgical pathology( [facility]/ [facility]) - Pt is likely going to need a LEEP for treatment given findings  with pap and biopsy.  Will await pathology results.

## 2021-01-05 LAB — SURGICAL PATHOLOGY

## 2021-01-09 ENCOUNTER — Telehealth: Payer: Self-pay

## 2021-01-09 NOTE — Telephone Encounter (Signed)
Called Pt to advise of test results & that she will need a LEEP. Advised Pt that some one will be contacting her to get that scheduled. Pt verbalized understanding.

## 2021-01-09 NOTE — Telephone Encounter (Signed)
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  Needs LEEP for treatment.  Thank you.

## 2021-02-01 ENCOUNTER — Ambulatory Visit: Payer: Medicaid Other | Admitting: Obstetrics & Gynecology

## 2021-02-23 ENCOUNTER — Encounter: Payer: Self-pay | Admitting: Family Medicine

## 2021-02-23 ENCOUNTER — Other Ambulatory Visit: Payer: Self-pay

## 2021-02-23 ENCOUNTER — Ambulatory Visit (INDEPENDENT_AMBULATORY_CARE_PROVIDER_SITE_OTHER): Payer: Medicaid Other | Admitting: Family Medicine

## 2021-02-23 VITALS — BP 126/71 | HR 68 | Wt 162.6 lb

## 2021-02-23 DIAGNOSIS — R87613 High grade squamous intraepithelial lesion on cytologic smear of cervix (HGSIL): Secondary | ICD-10-CM | POA: Diagnosis not present

## 2021-02-23 NOTE — Progress Notes (Signed)
   GYNECOLOGY PROBLEM  VISIT ENCOUNTER NOTE  Subjective:   Carolyn Blake is a 31 y.o. G56P2012 female here for LEEP.   Current complaints: NONE.    Denies abnormal vaginal bleeding, discharge, pelvic pain, problems with intercourse or other gynecologic concerns.   We reviewed her past pap and colpo results   Gynecologic History No LMP recorded. (Menstrual status: IUD). Contraception: IUD  Health Maintenance Due  Topic Date Due  . Hepatitis C Screening  Never done  . COVID-19 Vaccine (1) Never done     The following portions of the patient's history were reviewed and updated as appropriate: allergies, current medications, past family history, past medical history, past social history, past surgical history and problem list.  Review of Systems Pertinent items are noted in HPI.   Objective:  BP 126/71   Pulse 68   Wt 162 lb 9.6 oz (73.8 kg)   BMI 27.91 kg/m  Gen: well appearing, NAD HEENT: no scleral icterus CV: RR Lung: Normal WOB Ext: warm well perfused   Assessment and Plan:   1. HSIL (high grade squamous intraepithelial lesion) on Pap smear of cervix See overview Discussed results with patient. She had HSIL with HPV NEGATIVE in December Colpo showed CIN 1 of most concerning lesions seen on colpo and ECC with scan fragments of p16 concern for higher grade.  I reviewed the case with Dr Sabra Heck and also the ASCCP guidelines I discussed with the patient that it is reasonable to have repeat pap in 1 year (Dec 2022 vs Jan 2023) and then management of results with possible repeat colpo vs LEEP.  Patient plans on one more child before 48yo and has IUD in place for contraception.  We discussed the LEEP procedure and that I unfortunately do not do LEEPs at this practice yet  Patient was given the opportunity to reschedule LEEP vs repeat pap in 1 year with HPV cotesting She opted for pap in 1 year and voiced understanding of importance of follow up  She was frustrated and  feels this visit would have been best as a virtual discussion. I apologized for the situation with my inability to perform the LEEP today as she thought would happen.   Message sent to front desk to add to 1 year reach out/follow up list to prompt appointment.   >50% of this 25 min visit was spent in the above discussion  Please refer to After Visit Summary for other counseling recommendations.   Return in about 1 year (around 02/23/2022) for pap.  Caren Macadam, MD, MPH, ABFM Attending [place] for [facility]

## 2023-01-24 ENCOUNTER — Encounter: Payer: Self-pay | Admitting: Internal Medicine

## 2023-02-22 ENCOUNTER — Ambulatory Visit: Payer: Medicaid Other | Admitting: Internal Medicine

## 2023-02-22 ENCOUNTER — Other Ambulatory Visit (INDEPENDENT_AMBULATORY_CARE_PROVIDER_SITE_OTHER): Payer: Medicaid Other

## 2023-02-22 ENCOUNTER — Encounter: Payer: Self-pay | Admitting: Internal Medicine

## 2023-02-22 DIAGNOSIS — K5900 Constipation, unspecified: Secondary | ICD-10-CM | POA: Diagnosis not present

## 2023-02-22 DIAGNOSIS — K51911 Ulcerative colitis, unspecified with rectal bleeding: Secondary | ICD-10-CM

## 2023-02-22 DIAGNOSIS — K625 Hemorrhage of anus and rectum: Secondary | ICD-10-CM | POA: Diagnosis not present

## 2023-02-22 LAB — COMPREHENSIVE METABOLIC PANEL
ALT: 13 U/L (ref 0–35)
AST: 22 U/L (ref 0–37)
Albumin: 3.8 g/dL (ref 3.5–5.2)
Alkaline Phosphatase: 79 U/L (ref 39–117)
BUN: 20 mg/dL (ref 6–23)
CO2: 28 mEq/L (ref 19–32)
Calcium: 9.1 mg/dL (ref 8.4–10.5)
Chloride: 105 mEq/L (ref 96–112)
Creatinine, Ser: 0.73 mg/dL (ref 0.40–1.20)
GFR: 110.26 mL/min (ref >60.00)
Glucose, Bld: 75 mg/dL (ref 70–99)
Potassium: 3.6 mEq/L (ref 3.5–5.1)
Sodium: 140 mEq/L (ref 135–145)
Total Bilirubin: 0.5 mg/dL (ref 0.2–1.2)
Total Protein: 7.1 g/dL (ref 6.0–8.3)

## 2023-02-22 LAB — B12 AND FOLATE PANEL
Folate: 8.6 ng/mL (ref >5.9)
Vitamin B-12: 779 pg/mL (ref 211–911)

## 2023-02-22 LAB — CBC WITH DIFFERENTIAL/PLATELET
Basophils Absolute: 0 10*3/uL (ref 0.0–0.1)
Basophils Relative: 0.7 % (ref 0.0–3.0)
Eosinophils Absolute: 0.3 10*3/uL (ref 0.0–0.7)
Eosinophils Relative: 5.2 % — ABNORMAL HIGH (ref 0.0–5.0)
HCT: 37.5 % (ref 36.0–46.0)
Hemoglobin: 12.3 g/dL (ref 12.0–15.0)
Lymphocytes Relative: 31.7 % (ref 12.0–46.0)
Lymphs Abs: 1.9 10*3/uL (ref 0.7–4.0)
MCHC: 32.7 g/dL (ref 30.0–36.0)
MCV: 85.3 fl (ref 78.0–100.0)
Monocytes Absolute: 0.5 10*3/uL (ref 0.1–1.0)
Monocytes Relative: 7.8 % (ref 3.0–12.0)
Neutro Abs: 3.3 10*3/uL (ref 1.4–7.7)
Neutrophils Relative %: 54.6 % (ref 43.0–77.0)
Platelets: 231 10*3/uL (ref 150.0–400.0)
RBC: 4.4 Mil/uL (ref 3.87–5.11)
RDW: 13 % (ref 11.5–15.5)
WBC: 6 10*3/uL (ref 4.0–10.5)

## 2023-02-22 LAB — IBC + FERRITIN
Ferritin: 54.8 ng/mL (ref 10.0–291.0)
Iron: 40 ug/dL — ABNORMAL LOW (ref 42–145)
Saturation Ratios: 13.4 % — ABNORMAL LOW (ref 20.0–50.0)
TIBC: 299.6 ug/dL (ref 250.0–450.0)
Transferrin: 214 mg/dL (ref 212.0–360.0)

## 2023-02-22 LAB — TSH: TSH: 2 u[IU]/mL (ref 0.35–5.50)

## 2023-02-22 LAB — C-REACTIVE PROTEIN: CRP: <1 mg/dL (ref 0.5–20.0)

## 2023-02-22 MED ORDER — NA SULFATE-K SULFATE-MG SULF 17.5-3.13-1.6 GM/177ML PO SOLN: 1.0000 | ORAL | 0 refills | Status: DC

## 2023-02-22 NOTE — Progress Notes (Addendum)
Chief Complaint: UC  HPI : 33 year old female with history of UC, anemia, headache presents with UC   She was diagnosed with UC at the age of 9. She was losing a lot weight at that time and appeared to be severely anemic. She was referred to Carol Ada and was diagnosed with mild UC based upon her colonoscopy procedure in 2016. She has not had another colonoscopy since then and has not been back to see Dr. Benson Norway since 2016. The last time she was on Linzess was about 8-9 years ago. Denies prior treatment for UC. After she had her two children, her UC symptoms became active again. She sometimes has fecal urgency but then doesn't produce a BM. For the last 3-4 months, she has on average 2 BMs per week. She passes BRBPR 4-6 times per week. If she eats a really big meal, it will come back up the same way that it came back down. Otherwise she is able to eat well. Denies dysphagia. Her stomach will cramp once a month. The ab pain is located in the RLQ quadrant. Her father has Crohn's disease. Aunt has UC. Weight has been stable. One of her children passed away due to SIDS.  Wt Readings from Last 3 Encounters:  02/22/23 160 lb (72.6 kg)  02/23/21 162 lb 9.6 oz (73.8 kg)  01/02/21 173 lb (78.5 kg)    Past Medical History:  Diagnosis Date   Anemia    Headache    HSV-2 (herpes simplex virus 2) infection    Irregular heart beat    Keratoconus    Ulcerative colitis ([place])    Past Surgical History:  Procedure Laterality Date   CESAREAN SECTION N/A 04/21/2017   Procedure: CESAREAN SECTION;  Surgeon: Osborne Oman, MD;  Location: [facility];  Service: Obstetrics;  Laterality: N/A;   CESAREAN SECTION N/A 11/05/2020   Procedure: CESAREAN SECTION;  Surgeon: Woodroe Mode, MD;  Location: [facility];  Service: Obstetrics;  Laterality: N/A;   NO PAST SURGERIES     Family History  Problem Relation Age of Onset   Heart disease Father    Diabetes Father    Peripheral Artery Disease Father     Liver disease Mother    Social History   Tobacco Use   Smoking status: Never   Smokeless tobacco: Never  Vaping Use   Vaping Use: Never used  Substance Use Topics   Alcohol use: No   Drug use: No   Current Outpatient Medications  Medication Sig Dispense Refill   triamcinolone (KENALOG) 0.1 % APPLY TO AFFECTED AREAS TWICE A DAY X2 WEEKS THEN AS NEEDED FLARES     DUPIXENT 300 MG/2ML SOPN Inject 1 pen subcutaneously every 2 weeks (Patient not taking: Reported on 02/22/2023)     No current facility-administered medications for this visit.   Allergies  Allergen Reactions   Morphine Itching   Review of Systems: All systems reviewed and negative except where noted in HPI.   Physical Exam: BP 110/80   Pulse 74   Ht '5\' 4"'$  (1.626 m)   Wt 160 lb (72.6 kg)   BMI 27.46 kg/m  Constitutional: Pleasant,well-developed, female in no acute distress. HEENT: Normocephalic and atraumatic. Conjunctivae are normal. No scleral icterus. Cardiovascular: Normal rate, regular rhythm.  Pulmonary/chest: Effort normal and breath sounds normal. No wheezing, rales or rhonchi. Abdominal: Soft, nondistended, nontender. Bowel sounds active throughout. There are no masses palpable. No hepatomegaly. Extremities: No edema Neurological: Alert and  oriented to person place and time. Skin: Skin is warm and dry. No rashes noted. Psychiatric: Normal mood and affect. Behavior is normal.  CT A/P w/contrast 11/26/15; IMPRESSION: Minimal ascites. Possible subtle gallbladder wall thickening likely secondary to the mild ascites. Mild nonspecific periportal edema.  RUQ U/S 11/26/15: IMPRESSION: No gallstones are noted within gallbladder. Normal CBD. No thickening of gallbladder wall. Small pericholecystic fluid.  ASSESSMENT AND PLAN: Ulcerative colitis Rectal bleeding Constipation Patient presents with UC diagnosed in 2016 by Dr. Benson Norway. She has not been on any treatment for her UC. Will plan for labs, CT  imaging, and colonoscopy to better determine her disease status.  - Check CBC, CMP, ferritin/IBC, folate, vitamin B12, vitamin D, CRP, TSH - Obtain CT A/P w/contrast - Colonoscopy [facility] - Will obtain records of her last EGD/colonoscopy and path reports from Dr. Corliss Blacker, MD  I spent 48 minutes of time, including in depth chart review, independent review of results as outlined above, communicating results with the patient directly, face-to-face time with the patient, coordinating care, ordering studies and medications as appropriate, and documentation.   ADDENDUM: Reviewed records from Dr. Benson Norway. Will have these records scanned into her chart.   Colonoscopy 11/29/15: Quality of bowel preparation was excellent. Diffuse area of granular and mildly friable mucosa was found in the rectum, sigmoid colon, descending colon, transverse colon, and mid-ascending colon. Cecum and proximal ascending colon were spared. There was also some discontinuity of inflammation in the rectum. Findings are suspicious for UC. Biopsies were taken with forceps. Path: Chronic active colitis. No dysplasia or carcinoma. Biopsies consists of fragments of chronic colitis. Ulcerative colitis is favored.  EGD 11/29/15: Normal esophagus, stomach, and duodenum.

## 2023-02-22 NOTE — Patient Instructions (Signed)
_______________________________________________________  If your blood pressure at your visit was 140/90 or greater, please contact your primary care physician to follow up on this.  If you are age 31 or younger, your body mass index should be between 19-25. Your Body mass index is 27.46 kg/m. If this is out of the aformentioned range listed, please consider follow up with your Primary Care Provider.  ________________________________________________________  The [facility] providers would like to encourage you to use [facility] to communicate with providers for non-urgent requests or questions.  Due to long hold times on the telephone, sending your provider a message by [facility] may be a faster and more efficient way to get a response.  Please allow 48 business hours for a response.  Please remember that this is for non-urgent requests.  _______________________________________________________  Your provider has requested that you go to the basement level for lab work before leaving today. Press "B" on the elevator. The lab is located at the first door on the left as you exit the elevator.  You have been scheduled for a CT scan of the abdomen and pelvis at [facility], 1st floor Radiology. You are scheduled on 03-11-23 at [place] should arrive 2 hours prior to your appointment time for registration and to drink 2 bottles of contrast.   Please follow the written instructions below on the day of your exam:   1) Do not eat anything after 4 hours prior to your test.  The purpose of you drinking the oral contrast is to aid in the visualization of your intestinal tract. The contrast solution may cause some diarrhea. Depending on your individual set of symptoms, you may also receive an intravenous injection of x-ray contrast/dye. Plan on being at [facility] for 45 minutes or longer, depending on the type of exam you are having performed.   If you have any questions regarding your exam or if you  need to reschedule, you may call [facility] at [phone] between the hours of 8:00 am and 5:00 pm, Monday-Friday.   Due to recent changes in healthcare laws, you may see the results of your imaging and laboratory studies on MyChart before your provider has had a chance to review them.  We understand that in some cases there may be results that are confusing or concerning to you. Not all laboratory results come back in the same time frame and the provider may be waiting for multiple results in order to interpret others.  Please give [place] 48 hours in order for your provider to thoroughly review all the results before contacting the office for clarification of your results.   You have been scheduled for a colonoscopy. Please follow written instructions given to you at your visit today.  Please pick up your prep supplies at the pharmacy within the next 1-3 days. If you use inhalers (even only as needed), please bring them with you on the day of your procedure.  We will attempt to obtain your EGD and Colonoscopy results from Dr Benson Norway.  Thank you for entrusting me with your care and choosing [facility].  Dr Lorenso Courier

## 2023-03-07 ENCOUNTER — Telehealth: Payer: Self-pay

## 2023-03-07 NOTE — Telephone Encounter (Signed)
Received fax from Dr Ulyses Amor office with 5 pages of colonoscopy and endoscopy reports for Dr Lorenso Courier to review.  Records left in Dr Libby Maw inbox for her review.

## 2023-03-11 ENCOUNTER — Ambulatory Visit (HOSPITAL_COMMUNITY)
Admission: RE | Admit: 2023-03-11 | Discharge: 2023-03-11 | Disposition: A | Discharge status: Home / Self Care | Payer: Medicaid Other | Source: Ambulatory Visit | Attending: Internal Medicine | Admitting: Internal Medicine

## 2023-03-11 DIAGNOSIS — K51911 Ulcerative colitis, unspecified with rectal bleeding: Secondary | ICD-10-CM | POA: Diagnosis present

## 2023-03-11 DIAGNOSIS — K625 Hemorrhage of anus and rectum: Secondary | ICD-10-CM | POA: Insufficient documentation

## 2023-03-11 DIAGNOSIS — K5900 Constipation, unspecified: Secondary | ICD-10-CM | POA: Insufficient documentation

## 2023-03-11 MED ORDER — IOHEXOL 300 MG/ML  SOLN
100.0000 mL | Freq: Once | INTRAMUSCULAR | Status: AC | PRN
  Administered 2023-03-11: 100 mL via INTRAVENOUS

## 2023-03-11 MED ORDER — SODIUM CHLORIDE (PF) 0.9 % IJ SOLN
INTRAMUSCULAR | Status: AC
  Filled 2023-03-11: qty 50

## 2023-03-11 MED ORDER — IOHEXOL 9 MG/ML PO SOLN
1000.0000 mL | ORAL | Status: AC
  Administered 2023-03-11: 1000 mL via ORAL

## 2023-04-18 ENCOUNTER — Encounter: Payer: Self-pay | Admitting: Internal Medicine

## 2023-04-18 ENCOUNTER — Ambulatory Visit (AMBULATORY_SURGERY_CENTER): Payer: Medicaid Other | Admitting: Internal Medicine

## 2023-04-18 VITALS — BP 114/61 | HR 72 | Temp 98.6°F | Resp 12 | Ht 64.0 in | Wt 160.0 lb

## 2023-04-18 DIAGNOSIS — K6289 Other specified diseases of anus and rectum: Secondary | ICD-10-CM | POA: Diagnosis not present

## 2023-04-18 DIAGNOSIS — D123 Benign neoplasm of transverse colon: Secondary | ICD-10-CM | POA: Diagnosis not present

## 2023-04-18 DIAGNOSIS — K51911 Ulcerative colitis, unspecified with rectal bleeding: Secondary | ICD-10-CM

## 2023-04-18 DIAGNOSIS — K5140 Inflammatory polyps of colon without complications: Secondary | ICD-10-CM | POA: Diagnosis not present

## 2023-04-18 MED ORDER — MESALAMINE 1000 MG RE SUPP
1000.0000 mg | Freq: Every day | RECTAL | 2 refills | Status: DC
Start: 2023-04-18 — End: 2023-08-19

## 2023-04-18 MED ORDER — SODIUM CHLORIDE 0.9 % IV SOLN: 500.0000 mL | Freq: Once | INTRAVENOUS | Status: DC

## 2023-04-18 NOTE — Progress Notes (Signed)
Called to room to assist during endoscopic procedure.  Patient ID and intended procedure confirmed with present staff. Received instructions for my participation in the procedure from the performing physician.  

## 2023-04-18 NOTE — Patient Instructions (Signed)
Thank you for coming in to see [place] today. Resume previous diet and medications today. Return to regular daily activities tomorrow.  New prescription:  Canasa 1000 mg suppository to rectum every night before bed.  Follow-up appointment with Dr Leonides Schanz Friday 07/26/23 @ 9:10 am.  Please arrive 10 minutes early.  Her office is on the 3rd floor.   YOU HAD AN ENDOSCOPIC PROCEDURE TODAY AT THE [facility]:   Refer to the procedure report that was given to you for any specific questions about what was found during the examination.  If the procedure report does not answer your questions, please call your gastroenterologist to clarify.  If you requested that your care partner not be given the details of your procedure findings, then the procedure report has been included in a sealed envelope for you to review at your convenience later.  YOU SHOULD EXPECT: Some feelings of bloating in the abdomen. Passage of more gas than usual.  Walking can help get rid of the air that was put into your GI tract during the procedure and reduce the bloating. If you had a lower endoscopy (such as a colonoscopy or flexible sigmoidoscopy) you may notice spotting of blood in your stool or on the toilet paper. If you underwent a bowel prep for your procedure, you may not have a normal bowel movement for a few days.  Please Note:  You might notice some irritation and congestion in your nose or some drainage.  This is from the oxygen used during your procedure.  There is no need for concern and it should clear up in a day or so.  SYMPTOMS TO REPORT IMMEDIATELY:  Following lower endoscopy (colonoscopy or flexible sigmoidoscopy):  Excessive amounts of blood in the stool  Significant tenderness or worsening of abdominal pains  Swelling of the abdomen that is new, acute  Fever of 100F or higher    For urgent or emergent issues, a gastroenterologist can be reached at any hour by calling [phone]. Do not use  MyChart messaging for urgent concerns.    DIET:  We do recommend a small meal at first, but then you may proceed to your regular diet.  Drink plenty of fluids but you should avoid alcoholic beverages for 24 hours.  ACTIVITY:  You should plan to take it easy for the rest of today and you should NOT DRIVE or use heavy machinery until tomorrow (because of the sedation medicines used during the test).    FOLLOW UP: Our staff will call the number listed on your records the next business day following your procedure.  We will call around 7:15- 8:00 am to check on you and address any questions or concerns that you may have regarding the information given to you following your procedure. If we do not reach you, we will leave a message.     If any biopsies were taken you will be contacted by phone or by letter within the next 1-3 weeks.  Please call us at [phone] if you have not heard about the biopsies in 3 weeks.    SIGNATURES/CONFIDENTIALITY: You and/or your care partner have signed paperwork which will be entered into your electronic medical record.  These signatures attest to the fact that that the information above on your After Visit Summary has been reviewed and is understood.  Full responsibility of the confidentiality of this discharge information lies with you and/or your care-partner.

## 2023-04-18 NOTE — Progress Notes (Signed)
GASTROENTEROLOGY PROCEDURE H&P NOTE   Primary Care Physician: Patient, No Pcp Per    Reason for Procedure:   Ulcerative colitis  Plan:    Colonoscopy  Patient is appropriate for endoscopic procedure(s) in the ambulatory ([facility]) setting.  The nature of the procedure, as well as the risks, benefits, and alternatives were carefully and thoroughly reviewed with the patient. Ample time for discussion and questions allowed. The patient understood, was satisfied, and agreed to proceed.     HPI: Carolyn Blake is a 33 y.o. female who presents for colonoscopy for evaluation of ulcerative colitis .  Patient was most recently seen in the Gastroenterology Clinic on 02/22/23.  No interval change in medical history since that appointment. Please refer to that note for full details regarding GI history and clinical presentation.   Past Medical History:  Diagnosis Date   Anemia    Blood transfusion without reported diagnosis    Headache    Heart murmur    HSV-2 (herpes simplex virus 2) infection    Irregular heart beat    Keratoconus    Ulcerative colitis     Past Surgical History:  Procedure Laterality Date   CESAREAN SECTION N/A 04/21/2017   Procedure: CESAREAN SECTION;  Surgeon: Tereso Newcomer, MD;  Location: [facility];  Service: Obstetrics;  Laterality: N/A;   CESAREAN SECTION N/A 11/05/2020   Procedure: CESAREAN SECTION;  Surgeon: Adam Phenix, MD;  Location: [facility] LD [facility];  Service: Obstetrics;  Laterality: N/A;   NO PAST SURGERIES      Prior to Admission medications   Medication Sig Start Date End Date Taking? Authorizing Provider  triamcinolone (KENALOG) 0.1 % APPLY TO AFFECTED AREAS TWICE A DAY X2 WEEKS THEN AS NEEDED FLARES 02/01/21   [provider]    Current Outpatient Medications  Medication Sig Dispense Refill   triamcinolone (KENALOG) 0.1 % APPLY TO AFFECTED AREAS TWICE A DAY X2 WEEKS THEN AS NEEDED FLARES     Current Facility-Administered  Medications  Medication Dose Route Frequency Provider Last Rate Last Admin   0.9 %  sodium chloride infusion  500 mL Intravenous Once Imogene Burn, MD        Allergies as of 04/18/2023 - Review Complete 04/18/2023  Allergen Reaction Noted   Morphine Itching 02/22/2023    Family History  Problem Relation Age of Onset   Liver disease Mother    Heart disease Father    Diabetes Father    Peripheral Artery Disease Father    Colon cancer Neg Hx    Esophageal cancer Neg Hx    Rectal cancer Neg Hx    Stomach cancer Neg Hx     Social History   Socioeconomic History   Marital status: Single    Spouse name: Not on file   Number of children: Not on file   Years of education: Not on file   Highest education level: Not on file  Occupational History   Not on file  Tobacco Use   Smoking status: Never   Smokeless tobacco: Never  Vaping Use   Vaping Use: Never used  Substance and Sexual Activity   Alcohol use: No   Drug use: No   Sexual activity: Yes    Birth control/protection: None  Other Topics Concern   Not on file  Social History Narrative   Not on file   Social Determinants of Health   Financial Resource Strain: Not on file  Food Insecurity: No Food Insecurity (01/02/2021)  Hunger Vital Sign    Worried About Running Out of Food in the Last Year: Never true    Ran Out of Food in the Last Year: Never true  Transportation Needs: No Transportation Needs (01/02/2021)   PRAPARE - Administrator, Civil Service (Medical): No    Lack of Transportation (Non-Medical): No  Physical Activity: Not on file  Stress: Not on file  Social Connections: Not on file  Intimate Partner Violence: Not on file    Physical Exam: Vital signs in last 24 hours: BP 123/69   Pulse 76   Temp 98.6 F (37 C)   Ht  (1.626 m)   Wt 160 lb (72.6 kg)   SpO2 100%   BMI 27.46 kg/m  GEN: NAD EYE: Sclerae anicteric ENT: MMM CV: Non-tachycardic Pulm: No increased WOB GI:  Soft NEURO:  Alert & Oriented   Eulah Pont, MD [facility]   04/18/2023 3:14 PM

## 2023-04-18 NOTE — Op Note (Signed)
[facility] Patient Name: Carolyn Blake Procedure Date: 04/18/2023 3:45 PM MRN: [id number] Endoscopist: Madelyn Brunner [place] , , [id number] Age: 33 Referring MD:  Date of Birth: 1990/05/06 Gender: Female Account #: [id number] Procedure:                Colonoscopy Indications:              Follow-up of ulcerative colitis Medicines:                Monitored Anesthesia Care Procedure:                Pre-Anesthesia Assessment:                           - Prior to the procedure, a History and Physical                            was performed, and patient medications and                            allergies were reviewed. The patient's tolerance of                            previous anesthesia was also reviewed. The risks                            and benefits of the procedure and the sedation                            options and risks were discussed with the patient.                            All questions were answered, and informed consent                            was obtained. Prior Anticoagulants: The patient has                            taken no anticoagulant or antiplatelet agents. ASA                            Grade Assessment: II - A patient with mild systemic                            disease. After reviewing the risks and benefits,                            the patient was deemed in satisfactory condition to                            undergo the procedure.                           After obtaining informed consent, the colonoscope  was passed under direct vision. Throughout the                            procedure, the patient's blood pressure, pulse, and                            oxygen saturations were monitored continuously. The                            Olympus SN [id number] was introduced through the anus                            and advanced to the the terminal ileum. The                            colonoscopy was  performed without difficulty. The                            patient tolerated the procedure well. The quality                            of the bowel preparation was excellent. The                            terminal ileum, ileocecal valve, appendiceal                            orifice, and rectum were photographed. Scope In: 4:02:04 PM Scope Out: 4:26:01 PM Scope Withdrawal Time: 0 hours 17 minutes 24 seconds  Total Procedure Duration: 0 hours 23 minutes 57 seconds  Findings:                 The terminal ileum appeared normal. Biopsies were                            taken with a cold forceps for histology.                           A 3 mm polyp was found in the transverse colon. The                            polyp was sessile. The polyp was removed with a                            cold snare. Resection and retrieval were complete.                           Localized inflammation characterized by congestion                            (edema), erosions and erythema was found in the                            rectum.  Four biopsies were taken every 10 cm with a cold                            forceps from the right colon, left colon,                            transverse colon and rectum for ulcerative colitis                            surveillance. These biopsy specimens from the right                            colon, left colon, transverse colon and rectum were                            sent to Pathology. Complications:            No immediate complications. Estimated Blood Loss:     Estimated blood loss was minimal. Impression:               - The examined portion of the ileum was normal.                            Biopsied.                           - One 3 mm polyp in the transverse colon, removed                            with a cold snare. Resected and retrieved.                           - Localized inflammation was found in the rectum.                            - Biopsies for surveillance were taken from the                            right colon, left colon, transverse colon and                            rectum. Recommendation:           - Discharge patient to home (with escort).                           - Await pathology results.                           - Use Canasa 1000 mg suppository 1 per rectum QHS.                           - Return to GI clinic in 2 months.                           -  The findings and recommendations were discussed                            with the patient. Dr Particia Lather "[name]" Leonides Schanz,  04/18/2023 4:39:33 PM

## 2023-04-18 NOTE — Progress Notes (Signed)
Pt resting comfortably. VSS. Airway intact. SBAR complete to RN. All questions answered.   

## 2023-04-19 ENCOUNTER — Telehealth: Payer: Self-pay | Admitting: *Deleted

## 2023-04-19 NOTE — Telephone Encounter (Signed)
  Follow up Call-     04/18/2023    2:58 PM  Call back number  Post procedure Call Back phone  # [phone]  Permission to leave phone message Yes     Patient questions:  Do you have a fever, pain , or abdominal swelling? No. Pain Score  0 *  Have you tolerated food without any problems? Yes.    Have you been able to return to your normal activities? Yes.    Do you have any questions about your discharge instructions: Diet   No. Medications  No. Follow up visit  No.  Do you have questions or concerns about your Care? No.  Actions: * If pain score is 4 or above: No action needed, pain <4.

## 2023-04-23 ENCOUNTER — Encounter: Payer: Self-pay | Admitting: Internal Medicine

## 2023-05-30 ENCOUNTER — Other Ambulatory Visit: Payer: Self-pay

## 2023-05-30 ENCOUNTER — Ambulatory Visit (INDEPENDENT_AMBULATORY_CARE_PROVIDER_SITE_OTHER): Payer: Medicaid Other | Admitting: Obstetrics and Gynecology

## 2023-05-30 ENCOUNTER — Other Ambulatory Visit (HOSPITAL_COMMUNITY)
Admission: RE | Admit: 2023-05-30 | Discharge: 2023-05-30 | Disposition: A | Discharge status: Home / Self Care | Payer: Medicaid Other | Source: Ambulatory Visit | Attending: Obstetrics and Gynecology | Admitting: Obstetrics and Gynecology

## 2023-05-30 ENCOUNTER — Encounter: Payer: Self-pay | Admitting: Obstetrics and Gynecology

## 2023-05-30 VITALS — BP 114/75 | HR 76 | Wt 162.0 lb

## 2023-05-30 DIAGNOSIS — Z8742 Personal history of other diseases of the female genital tract: Secondary | ICD-10-CM

## 2023-05-30 DIAGNOSIS — T8332XA Displacement of intrauterine contraceptive device, initial encounter: Secondary | ICD-10-CM | POA: Diagnosis not present

## 2023-05-30 NOTE — Progress Notes (Signed)
Obstetrics and Gynecology Visit Return Patient Evaluation  Appointment Date: 05/30/2023  Primary Care Provider: Patient, No Pcp Per  OBGYN Clinic: [facility]  Chief Complaint: repeat pap smear  History of Present Illness:  Farryn Brother is a 33 y.o. 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   Review of Systems: as noted in the History of Present Illness.   Patient Active Problem List   Diagnosis Date Noted   Contraception, device intrauterine    H/O: cesarean section 08/25/2020   HSIL (high grade squamous intraepithelial lesion) on Pap smear of cervix 06/24/2020   Irregular heart beat    History of sexually transmitted disease (CT and trich) 10/31/2016   Ulcerative colitis (HCC) 10/03/2016   History of PCR DNA positive for HSV2 10/03/2016   Sickle cell trait (HCC) 10/03/2016   Anemia 11/22/2015   Medications:  Eulalah Attebery had no medications administered during this visit. Current Outpatient Medications  Medication Sig Dispense Refill   mesalamine (CANASA) 1000 MG suppository Place 1 suppository (1,000 mg total) rectally at bedtime. 30 suppository 2   Multiple Vitamin (MULTIVITAMIN ADULT PO) Take by mouth.     triamcinolone (KENALOG) 0.1 % APPLY TO AFFECTED AREAS TWICE A DAY X2 WEEKS THEN AS NEEDED FLARES     No current facility-administered medications for this visit.    Allergies: is allergic to morphine.  Physical Exam:  BP 114/75   Pulse 76   Wt 162 lb (73.5 kg)   Breastfeeding No   BMI 27.81 kg/m  Body mass index is 27.81 kg/m. General appearance: Well nourished, well developed female in no acute distress.  Abdomen: diffusely non tender to palpation, non  distended, and no masses, hernias Neuro/Psych:  Normal mood and affect.    Pelvic exam:  Cervical exam performed in the presence of a chaperone EGBUS: normal Vaginal vault: normal. Scant brown/tan d/c in vault Cervix:  IUD strings not seen. Cervix wnl Bimanual: deferred   Assessment: pt stable. No IUD strings seen  Plan:  1. History of abnormal cervical Pap smear - Cytology - PAP( )  2. Intrauterine contraceptive device threads lost, initial encounter - US PELVIC COMPLETE WITH TRANSVAGINAL; Future   RTC: PRN   Future Appointments  Date Time Provider Department Center  06/06/2023  2:30 PM [facility]  07/26/2023  9:10 AM Imogene Burn, MD [facility]    Cornelia Copa MD Attending Center for [facility])

## 2023-06-05 ENCOUNTER — Encounter: Payer: Self-pay | Admitting: Obstetrics and Gynecology

## 2023-06-05 DIAGNOSIS — T8332XA Displacement of intrauterine contraceptive device, initial encounter: Secondary | ICD-10-CM | POA: Insufficient documentation

## 2023-06-05 LAB — CYTOLOGY - PAP
Chlamydia: NEGATIVE
Comment: NEGATIVE
Comment: NEGATIVE
Comment: NEGATIVE
Comment: NEGATIVE
Comment: NEGATIVE
Comment: NORMAL
Diagnosis: HIGH — AB
HPV 16: NEGATIVE
HPV 18 / 45: NEGATIVE
High risk HPV: POSITIVE — AB
Neisseria Gonorrhea: NEGATIVE
Trichomonas: NEGATIVE

## 2023-06-06 ENCOUNTER — Telehealth: Payer: Self-pay

## 2023-06-06 ENCOUNTER — Ambulatory Visit (HOSPITAL_COMMUNITY)
Admission: RE | Admit: 2023-06-06 | Discharge: 2023-06-06 | Disposition: A | Discharge status: Home / Self Care | Payer: Medicaid Other | Source: Ambulatory Visit | Attending: Obstetrics and Gynecology | Admitting: Obstetrics and Gynecology

## 2023-06-06 DIAGNOSIS — T8332XA Displacement of intrauterine contraceptive device, initial encounter: Secondary | ICD-10-CM | POA: Insufficient documentation

## 2023-06-06 NOTE — Telephone Encounter (Addendum)
-----   Message from [place] Bing, MD sent at 06/05/2023  9:03 PM EDT ----- Needs colpo with any provider. Thanks  Pt notified of results and need for colpo.  Pt scheduled for 07/25/23 at 1:35pm.  Pt agreed.  Leonette Nutting  06/06/23

## 2023-07-25 ENCOUNTER — Other Ambulatory Visit: Payer: Self-pay

## 2023-07-25 ENCOUNTER — Ambulatory Visit (INDEPENDENT_AMBULATORY_CARE_PROVIDER_SITE_OTHER): Payer: Medicaid Other | Admitting: Obstetrics and Gynecology

## 2023-07-25 ENCOUNTER — Encounter: Payer: Self-pay | Admitting: Obstetrics and Gynecology

## 2023-07-25 ENCOUNTER — Other Ambulatory Visit (HOSPITAL_COMMUNITY)
Admission: RE | Admit: 2023-07-25 | Discharge: 2023-07-25 | Disposition: A | Discharge status: Home / Self Care | Payer: Medicaid Other | Source: Ambulatory Visit | Attending: Obstetrics and Gynecology | Admitting: Obstetrics and Gynecology

## 2023-07-25 VITALS — BP 122/74 | HR 65 | Ht 64.0 in | Wt 161.7 lb

## 2023-07-25 DIAGNOSIS — R87613 High grade squamous intraepithelial lesion on cytologic smear of cervix (HGSIL): Secondary | ICD-10-CM

## 2023-07-25 DIAGNOSIS — Z01818 Encounter for other preprocedural examination: Secondary | ICD-10-CM | POA: Diagnosis not present

## 2023-07-25 LAB — POCT PREGNANCY, URINE: Preg Test, Ur: NEGATIVE

## 2023-07-25 NOTE — Procedures (Signed)
Colposcopy Procedure Note  Pre-operative Diagnosis:  05/30/2023 pap: HSIL/HPV+, negative 16, 18/45 01/2021: LEEP vs repeat pap in one year recommended and patient opted for latter.  12/2020 colpo (no comment on adequacy) : CIN 1 on biopsies, scant dysplastic cells on ECC 11/2020 postpartum pap: HSIL/HPV+, no subtyping done 06/2020 pregnancy colpo (adequate): only AWE changes seen, no biopsies taken.  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 Cervical exam performed in the presence of a chaperone The risks (including infection, bleeding, pain) and benefits of the procedure were explained to the patient and written informed consent was obtained.  The patient was placed in the dorsal lithotomy position. A Graves was speculum inserted in the vagina, and the cervix was visualized.  Acetic acid staining was done and the cervix was viewed with green filter; lugol's staining with green filter was also done .  Biopsy from 1 o'clock (heavy bleeding from this area) and from 8 and 4 o'clock and then single toothed tenaculum applied and endocervical curettage in all four quadrants done. There was no bleeding after procedure after holding pressure and application of monsels  Findings: from 12 to 2 o'clock there was a "tail" of cervix that was erythematous that went diagonally; mild awe changes at the os.   Adequate: No  Specimens: 1 o'clock biopsy (sent separate); 8 and 4 o'clock cervix (sent together). Endocervical curettage  Condition: Stable  Complications: None  Plan: The patient was advised to call for any fever or for prolonged or severe pain or bleeding. She was advised to use OTC analgesics as needed for mild to moderate pain. Pelvic rest x 1 week   [place] Bing, Montez Hageman MD Attending Center for [facility])

## 2023-07-26 ENCOUNTER — Ambulatory Visit: Payer: Medicaid Other | Admitting: Internal Medicine

## 2023-07-26 ENCOUNTER — Encounter: Payer: Self-pay | Admitting: Obstetrics and Gynecology

## 2023-07-26 HISTORY — PX: COLPOSCOPY W/ BIOPSY / CURETTAGE: SUR283

## 2023-08-13 ENCOUNTER — Telehealth: Payer: Self-pay

## 2023-08-13 NOTE — Telephone Encounter (Addendum)
-----   Message from [facility] sent at 08/13/2023  7:16 AM EDT ----- I don't see that she has viewed the message. Please call her and let her know that our original plan for a LEEP at her visit this month is still the plan given how high grade her biopsy results are. thanks  Called pt; she was able to view results in MyChart. Was not aware of new appt but after reviewing this she states this works with her schedule. Does recall conversation about LEEP and is planning for this. No concerns today.

## 2023-08-17 ENCOUNTER — Other Ambulatory Visit: Payer: Self-pay | Admitting: Internal Medicine

## 2023-08-17 DIAGNOSIS — K51911 Ulcerative colitis, unspecified with rectal bleeding: Secondary | ICD-10-CM

## 2023-08-22 ENCOUNTER — Ambulatory Visit: Payer: Medicaid Other | Admitting: Obstetrics and Gynecology

## 2023-08-22 ENCOUNTER — Other Ambulatory Visit (HOSPITAL_COMMUNITY)
Admission: RE | Admit: 2023-08-22 | Discharge: 2023-08-22 | Disposition: A | Discharge status: Home / Self Care | Payer: Medicaid Other | Source: Ambulatory Visit | Attending: Obstetrics and Gynecology | Admitting: Obstetrics and Gynecology

## 2023-08-22 ENCOUNTER — Encounter: Payer: Self-pay | Admitting: Obstetrics and Gynecology

## 2023-08-22 ENCOUNTER — Other Ambulatory Visit: Payer: Self-pay

## 2023-08-22 VITALS — Ht 64.0 in | Wt 154.5 lb

## 2023-08-22 DIAGNOSIS — D069 Carcinoma in situ of cervix, unspecified: Secondary | ICD-10-CM | POA: Diagnosis present

## 2023-08-22 HISTORY — PX: LEEP: PRO1013

## 2023-08-22 LAB — POCT PREGNANCY, URINE: Preg Test, Ur: NEGATIVE

## 2023-08-22 NOTE — Procedures (Signed)
Loop Electrosurgical Excisional Procedure Note  Pre-operative Diagnosis:  07/25/23 colpo (adequate): CIN 3 biopsies; ECC: ?positive  Post-operative Diagnosis: Same  Procedure Details  Urine pregnancy test: positive   The risks (including infection, bleeding, pain, preterm birth, cervical stenosis) and benefits of the procedure were explained to the patient and written informed consent was obtained.  The patient was placed in the dorsal lithotomy position. A coated Graves was speculum inserted in the vagina, and the cervix was visualized.  A colposcopy was performed with acetic acid and lugol's staining, with the below noted findings. The cervical stromal bed was injected with 10mL of 1% lidocaine with epinephrine. Entering at 12 o'clock on the cervix and using a large shallow Fischer Loop and a setting of 50/50 blend current, a LEEP was done, in one circumferential fashion but a small portion from 1130-12 was left; this was removed with the electrode and sent with the larger specimen. A top hat small leep portion was sent from the endocervical canal..  Next, an ECC was done and hemostasis achieved with the ball electrode on 50 coagulation current and application of Monsel's. On coagulation, the entire LEEP bed and surgical margins were thoroughly cauterized  Specimens: LEEP specimen (circumferential, 360 degrees), small 1130-12 o'clock leep specimen (sent together with larger specimen); top hat endocervical specimen; and post LEEP ECC   Condition: Stable  Complications: None  Plan: The patient was advised to call for any fever or for prolonged or severe pain or bleeding. She was advised to use OTC analgesics as needed for mild to moderate pain. Pelvic rest was advised until after her four week post operative visit.    Cornelia Copa MD Attending Center for [facility])

## 2023-08-22 NOTE — Progress Notes (Signed)
See leep procedure note

## 2023-08-23 LAB — SURGICAL PATHOLOGY

## 2023-09-23 ENCOUNTER — Ambulatory Visit: Payer: Medicaid Other | Admitting: Obstetrics and Gynecology

## 2023-09-23 ENCOUNTER — Other Ambulatory Visit: Payer: Self-pay

## 2023-09-23 ENCOUNTER — Encounter: Payer: Self-pay | Admitting: Obstetrics and Gynecology

## 2023-09-23 VITALS — BP 121/71 | HR 61 | Ht 64.0 in | Wt 152.1 lb

## 2023-09-23 DIAGNOSIS — Z9889 Other specified postprocedural states: Secondary | ICD-10-CM

## 2023-09-23 NOTE — Progress Notes (Signed)
Obstetrics and Gynecology Visit Return Patient Evaluation  Appointment Date: 09/23/2023  Primary Care Provider: Patient, No Pcp Per  OBGYN Clinic: [facility]  Chief Complaint: f/u 8/22 LEEP  History of Present Illness:  Carolyn Blake is a 33 y.o. G3P2 (LMP: none due to IUD) with above CC.  She had 8/22 LEEP with below pathology; prior colpo showed CIN 3 on biopsies and ?positive ECC  A. CERVIX, 1130-12 O'CLOCK REMNANT, LEEP:  High-grade squamous intraepithelial lesion, CIN-3 (high-grade dysplasia)  extending into endocervical glands.  Endocervical margin focally positive for dysplasia.  Ectocervical margin negative for dysplasia.   B. CERVIX, ENDOCERVICAL CANAL, LEEP:  Benign endocervical mucosa.  Negative for dysplasia.   C. ENDOCERVIX, CURETTAGE.  Benign endocervical mucosa.  Negative for dysplasia.   Interval History: Since that time, she states that she is doing well and no problems or issues.   Review of Systems: as noted in the History of Present Illness.  Patient Active Problem List   Diagnosis Date Noted   IUD strings lost 06/05/2023   HSIL (high grade squamous intraepithelial lesion) on Pap smear of cervix 06/24/2020   Irregular heart beat    Ulcerative colitis (HCC) 10/03/2016   Medications:  Earlie Counts had no medications administered during this visit. Current Outpatient Medications  Medication Sig Dispense Refill   levonorgestrel (LILETTA, 52 MG,) 20.1 MCG/DAY IUD IUD 1 each by Intrauterine route once.     mesalamine (CANASA) 1000 MG suppository PLACE 1 SUPPOSITORY (1,000 MG TOTAL) RECTALLY AT BEDTIME. 30 suppository 2   Multiple Vitamin (MULTIVITAMIN ADULT PO) Take by mouth.     triamcinolone (KENALOG) 0.1 % APPLY TO AFFECTED AREAS TWICE A DAY X2 WEEKS THEN AS NEEDED FLARES     No current facility-administered medications for this visit.    Allergies: is allergic to morphine.  Physical Exam:  BP 121/71    Pulse 61   Ht 5\' 4"  (1.626 m)   Wt 152 lb 1.6 oz (69 kg)   BMI 26.11 kg/m  Body mass index is 26.11 kg/m. General appearance: Well nourished, well developed female in no acute distress.  Neuro/Psych:  Normal mood and affect.    Pelvic exam:  Cervical exam performed in the presence of a chaperone EGBUS: wnl Vaginal vault: wnl Cervix:  wnl. Approximately 2-3cm off vaginal wall. Strings still not seen   Assessment: patient doing well  Plan:  1. History of loop electrical excision procedure (LEEP) I told her that I likely got all the abnormal cells but can't 100% say due to unable to interpret cautery. Recommend six month pap and hpv test which patient is amenable to   Return in about 6 months (around 03/22/2024) for in person.  Future Appointments  Date Time Provider Department Center  10/24/2023  2:10 PM Imogene Burn, MD [facility]    Cornelia Copa MD Attending Center for [facility])

## 2023-10-24 ENCOUNTER — Encounter: Payer: Self-pay | Admitting: Internal Medicine

## 2023-10-24 ENCOUNTER — Ambulatory Visit: Payer: Medicaid Other | Admitting: Internal Medicine

## 2023-10-24 VITALS — BP 110/70 | HR 75 | Ht 64.0 in | Wt 154.0 lb

## 2023-10-24 DIAGNOSIS — R152 Fecal urgency: Secondary | ICD-10-CM

## 2023-10-24 DIAGNOSIS — R109 Unspecified abdominal pain: Secondary | ICD-10-CM | POA: Diagnosis not present

## 2023-10-24 DIAGNOSIS — K5900 Constipation, unspecified: Secondary | ICD-10-CM

## 2023-10-24 DIAGNOSIS — K51911 Ulcerative colitis, unspecified with rectal bleeding: Secondary | ICD-10-CM | POA: Diagnosis not present

## 2023-10-24 DIAGNOSIS — E611 Iron deficiency: Secondary | ICD-10-CM

## 2023-10-24 MED ORDER — MESALAMINE 1000 MG RE SUPP: 1000.0000 mg | Freq: Every day | RECTAL | 1 refills | Status: DC

## 2023-10-24 MED ORDER — MESALAMINE 1.2 G PO TBEC: 4.8000 g | DELAYED_RELEASE_TABLET | Freq: Every day | ORAL | 5 refills | Status: DC

## 2023-10-24 NOTE — Progress Notes (Signed)
Chief Complaint: UC  HPI : 33 year old female with history of pan-UC, anemia, and headache presents for follow-up of UC   Interval History: She has 2-3 BMs per week currently. The suppositories help in the sense that her BMs are smoother in coming out.  Her rectal bleeding has now resolved.  She is doing her mesalamine suppositories every day Mon-Fri and sometimes on Sat and Sun. She sometimes has cramping ab pain if she eats a lot. The ab pain is mostly in the RUQ and lower abdomen. She used to be on Linzess but she didn't really think that this helped. She has a sense of fecal urgency after ingesting anything.   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   qtrimster ucx  [x]  12/28: neg   Ulcerative colitis [facility])    Past Surgical History:  Procedure Laterality Date   CESAREAN SECTION N/A 04/21/2017   Procedure: CESAREAN SECTION;  Surgeon: Tereso Newcomer, MD;  [facility];  Service: Obstetrics;  Laterality: N/A;   CESAREAN SECTION N/A 11/05/2020   Procedure: CESAREAN SECTION;  Surgeon: Adam Phenix, MD;  Location: [facility];  Service: Obstetrics;  Laterality: N/A;   COLPOSCOPY W/ BIOPSY /  CURETTAGE  07/26/2023   LEEP  08/22/2023   NO PAST SURGERIES     Family History  Problem Relation Age of Onset   Liver disease Mother    Heart disease Father    Diabetes Father    Peripheral Artery Disease Father    Colon cancer Neg Hx    Esophageal cancer Neg Hx    Rectal cancer Neg Hx    Stomach cancer Neg Hx    Social History   Tobacco Use   Smoking status: Never   Smokeless tobacco: Never  Vaping Use   Vaping status: Never Used  Substance Use Topics   Alcohol use: No   Drug use: No   Current Outpatient Medications  Medication Sig Dispense Refill   fluticasone (FLONASE) 50 MCG/ACT nasal spray Place into both nostrils daily. Pt taking Flonase 2 a day     levonorgestrel (LILETTA, 52 MG,) 20.1 MCG/DAY IUD IUD 1 each by Intrauterine route once.     mesalamine (CANASA) 1000 MG suppository PLACE 1 SUPPOSITORY (1,000 MG TOTAL) RECTALLY AT BEDTIME. 30 suppository 2   Multiple Vitamin (MULTIVITAMIN ADULT PO) Take by mouth.     triamcinolone (KENALOG) 0.1 % APPLY TO AFFECTED AREAS TWICE A DAY X2 WEEKS THEN AS NEEDED FLARES     No current facility-administered medications for this visit.   Allergies  Allergen Reactions   Morphine Itching   Review of  Systems: All systems reviewed and negative except where noted in HPI.   Physical Exam: BP 110/70   Pulse 75   Ht 5\' 4"  (1.626 m)   Wt 154 lb (69.9 kg)   BMI 26.43 kg/m  Constitutional: Pleasant,well-developed, female in no acute distress. HEENT: Normocephalic and atraumatic. Conjunctivae are normal. No scleral icterus. Cardiovascular: Normal rate, regular rhythm.  Pulmonary/chest: Effort normal and breath sounds normal. No wheezing, rales or rhonchi. Abdominal: Soft, nondistended, nontender. Bowel sounds active throughout.  Extremities: No edema Neurological: Alert and oriented to person place and time. Skin: Skin is warm and dry. No rashes noted. Psychiatric: Normal mood and affect. Behavior is normal.  Labs 01/2023: CBC  with mildly elevated relative eosinophils of 5.2%.  CMP normal.  TSH normal.  CRP normal.  Vitamin B12 and folate normal.  Iron saturation low at 13.4%.  Iron low at 40.  CT A/P w/contrast 11/26/15; IMPRESSION: Minimal ascites. Possible subtle gallbladder wall thickening likely secondary to the mild ascites. Mild nonspecific periportal edema.  RUQ U/S 11/26/15: IMPRESSION: No gallstones are noted within gallbladder. Normal CBD. No thickening of gallbladder wall. Small pericholecystic fluid.  CT A/P w/contrast 03/11/23: IMPRESSION: 1. No acute findings within the abdomen or pelvis. No evidence of inflammatory bowel disease. 2. Moderate colonic stool burden  Colonoscopy 11/29/15: Quality of bowel preparation was excellent. Diffuse area of granular and mildly friable mucosa was found in the rectum, sigmoid colon, descending colon, transverse colon, and mid-ascending colon. Cecum and proximal ascending colon were spared. There was also some discontinuity of inflammation in the rectum. Findings are suspicious for UC. Biopsies were taken with forceps. Path: Chronic active colitis. No dysplasia or carcinoma. Biopsies consists of fragments of chronic colitis. Ulcerative colitis is favored.  EGD 11/29/15: Normal esophagus, stomach, and duodenum.   Colonoscopy 04/18/23: - The examined portion of the ileum was normal. Biopsied. - One 3 mm polyp in the transverse colon, removed with a cold snare. Resected and retrieved. - Localized inflammation was found in the rectum. - Biopsies for surveillance were taken from the right colon, left colon, transverse colon and rectum. Path: 1. Surgical [P], small bowel, ileal bx . SMALL INTESTINAL MUCOSA WITH NO SIGNIFICANT PATHOLOGY. 2. Surgical [P], right colon bx - COLONIC MUCOSA WITH A PROMINENT LYMPHOID AGGREGATE AND MINIMAL ARCHITECTURAL DISARRAY. 3. Surgical [P], colon, transverse polyp x1, polyp (1) - INFLAMMATORY/GRANULATION TISSUE TYPE POLYP 4. Surgical  [P], transverse colon bx - COLONIC MUCOSA WITH A PROMINENT LYMPHOID AGGREGATE AND FOCAL MINIMAL ARCHITECTURAL DISARRAY, OTHERWISE NO SIGNIFICANT PATHOLOGY. 5. Surgical [P], left colon bx - COLONIC MUCOSA WITH MINIMAL ARCHITECTURAL DISARRAY AND A SMALL LYMPHOID AGGREGATE, OTHERWISE NO SIGNIFICANT PATHOLOGY. 6. Surgical [P], colon, rectal bx - COLONIC MUCOSA WITH FOCAL MILD ACTIVITY, DENSE LAMINA PROPRIA LYMPHOPLASMACYTOSIS AND ARCHITECTURAL DISTORTION. NOTE: THE FINDINGS IN THE RECTUM ARE CONSISTENT WITH CHRONIC MILDLY ACTIVE COLITIS AND THE CLINICAL HISTORY OF ULCERATIVE COLITIS. THE OTHER BIOPSIES SHOW MINIMAL ARCHITECTURAL DISARRAY SUGGESTIVE OF AREAS OF CHRONIC INACTIVE COLITIS. THERE IS NO EVIDENCE OF GRANULOMAS, MORPHOLOGIC EVIDENCE OF VIRAL CYTOPATHIC EFFECT AND DYSPLASIA.  ASSESSMENT AND PLAN: Ulcerative colitis Rectal bleeding - resolved Constipation Fecal urgency Abdominal discomfort Iron deficiency without anemia Patient has been doing slightly better on Canasa suppositories with resolution of her prior rectal bleeding and slight improvement in how easily her stools come out.  Her recent colonoscopy confirmed active ulcerative colitis in the rectal area.  The rest of her colon biopsies showed chronic inactive colitis.  However patient does still have some abdominal  discomfort and fecal urgency.  Thus we will start her on oral mesalamine to see if this helps to keep her symptoms under better control.  Patient is agreeable to starting on oral mesalamine. - Start oral mesalamine 4.8 g every day - Continue mesalamine suppositories daily - RTC 6 months.  Will plan to recheck labs at her next visit.  Eulah Pont, MD  I spent 40 minutes of time, including in depth chart review, independent review of results as outlined above, communicating results with the patient directly, face-to-face time with the patient, coordinating care, ordering studies and medications as appropriate, and  documentation.

## 2023-10-24 NOTE — Patient Instructions (Addendum)
We have sent the following medications to your pharmacy for you to pick up at your convenience: Lialda 1.2g: Take 4.8 g (4 tablets) once daily Canasa suppositories: Use daily at bedtime  Please follow up in 6 months.  Thank you for entrusting me with your care and for choosing [facility], Dr. Eulah Pont   If your blood pressure at your visit was 140/90 or greater, please contact your primary care physician to follow up on this. ______________________________________________________  If you are age 80 or older, your body mass index should be between 23-30. Your Body mass index is 26.43 kg/m. If this is out of the aforementioned range listed, please consider follow up with your Primary Care Provider.  If you are age 72 or younger, your body mass index should be between 19-25. Your Body mass index is 26.43 kg/m. If this is out of the aformentioned range listed, please consider follow up with your Primary Care Provider.  ________________________________________________________  The [place] [facility] providers would like to encourage you to use [facility] to communicate with providers for non-urgent requests or questions.  Due to long hold times on the telephone, sending your provider a message by [facility] may be a faster and more efficient way to get a response.  Please allow 48 business hours for a response.  Please remember that this is for non-urgent requests.  _______________________________________________________  Due to recent changes in healthcare laws, you may see the results of your imaging and laboratory studies on MyChart before your provider has had a chance to review them.  We understand that in some cases there may be results that are confusing or concerning to you. Not all laboratory results come back in the same time frame and the provider may be waiting for multiple results in order to interpret others.  Please give [place] 48 hours in order for your provider to thoroughly review all  the results before contacting the office for clarification of your results.

## 2024-01-31 ENCOUNTER — Encounter: Payer: Self-pay | Admitting: Internal Medicine

## 2024-02-26 ENCOUNTER — Ambulatory Visit: Payer: 59 | Admitting: Internal Medicine

## 2024-05-18 ENCOUNTER — Ambulatory Visit: Payer: 59 | Admitting: Internal Medicine

## 2024-05-18 ENCOUNTER — Other Ambulatory Visit (INDEPENDENT_AMBULATORY_CARE_PROVIDER_SITE_OTHER)

## 2024-05-18 ENCOUNTER — Encounter: Payer: Self-pay | Admitting: Internal Medicine

## 2024-05-18 VITALS — BP 96/68 | HR 68 | Ht 64.0 in | Wt 154.0 lb

## 2024-05-18 DIAGNOSIS — E611 Iron deficiency: Secondary | ICD-10-CM

## 2024-05-18 DIAGNOSIS — K51911 Ulcerative colitis, unspecified with rectal bleeding: Secondary | ICD-10-CM

## 2024-05-18 DIAGNOSIS — K219 Gastro-esophageal reflux disease without esophagitis: Secondary | ICD-10-CM | POA: Diagnosis not present

## 2024-05-18 DIAGNOSIS — K5190 Ulcerative colitis, unspecified, without complications: Secondary | ICD-10-CM

## 2024-05-18 LAB — COMPREHENSIVE METABOLIC PANEL WITH GFR
ALT: 15 U/L (ref 0–35)
AST: 19 U/L (ref 0–37)
Albumin: 4.3 g/dL (ref 3.5–5.2)
Alkaline Phosphatase: 76 U/L (ref 39–117)
BUN: 15 mg/dL (ref 6–23)
CO2: 29 meq/L (ref 19–32)
Calcium: 9.1 mg/dL (ref 8.4–10.5)
Chloride: 103 meq/L (ref 96–112)
Creatinine, Ser: 0.63 mg/dL (ref 0.40–1.20)
GFR: 117.91 mL/min (ref >60.00)
Glucose, Bld: 75 mg/dL (ref 70–99)
Potassium: 3.8 meq/L (ref 3.5–5.1)
Sodium: 138 meq/L (ref 135–145)
Total Bilirubin: 0.5 mg/dL (ref 0.2–1.2)
Total Protein: 7.4 g/dL (ref 6.0–8.3)

## 2024-05-18 LAB — HIGH SENSITIVITY CRP: CRP, High Sensitivity: 0.72 mg/L (ref 0.000–5.000)

## 2024-05-18 MED ORDER — MESALAMINE 1.2 G PO TBEC: 4.8000 g | DELAYED_RELEASE_TABLET | Freq: Every day | ORAL | 5 refills | Status: DC

## 2024-05-18 NOTE — Progress Notes (Signed)
 Chief Complaint: UC  HPI : 34 year old female with history of pan-UC, anemia, and headache presents for follow-up of UC   Interval History: Her UC has been doing well. She is having one BM once a day and sometimes every other day. Denies blood in the stools. Denies ab pain. When she takes the oral mesalamine , she feels like she responds well. She stopped the suppositories. Acid reflux is bothersome to her every day and particularly after certain foods such as cinnamon. Endorses regurgitation. Denies chest burning. She has not tried anything for her acid reflux. Denies fecal urgency.  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   qtrimster ucx  [x]  12/28: neg   Ulcerative colitis [facility])    Past Surgical History:  Procedure Laterality Date   CESAREAN SECTION N/A 04/21/2017   Procedure: CESAREAN SECTION;  Surgeon: Julianne Octave, MD;  [facility]: [facility];  Service: Obstetrics;  Laterality: N/A;   CESAREAN SECTION N/A 11/05/2020   Procedure: CESAREAN SECTION;  Surgeon: Tresia Fruit, MD;  Location: [facility];  Service: Obstetrics;  Laterality: N/A;   COLPOSCOPY W/  BIOPSY / CURETTAGE  07/26/2023   LEEP  08/22/2023   NO PAST SURGERIES     Family History  Problem Relation Age of Onset   Liver disease Mother    Heart disease Father    Diabetes Father    Peripheral Artery Disease Father    Colon cancer Neg Hx    Esophageal cancer Neg Hx    Rectal cancer Neg Hx    Stomach cancer Neg Hx    Social History   Tobacco Use   Smoking status: Never   Smokeless tobacco: Never  Vaping Use   Vaping status: Never Used  Substance Use Topics   Alcohol use: No   Drug use: No   Current Outpatient Medications  Medication Sig Dispense Refill   fluticasone (FLONASE) 50 MCG/ACT nasal spray Place into both nostrils daily. Pt taking Flonase 2 a day     levonorgestrel  (LILETTA , 52 MG,) 20.1 MCG/DAY IUD IUD 1 each by Intrauterine route once.     mesalamine  (LIALDA ) 1.2 g EC tablet Take 4 tablets (4.8 g total) by mouth daily with breakfast. 120 tablet 5   Multiple Vitamin (MULTIVITAMIN ADULT PO) Take by mouth.     triamcinolone (KENALOG) 0.1 % APPLY TO AFFECTED AREAS TWICE A DAY X2 WEEKS THEN AS NEEDED FLARES     mesalamine  (CANASA ) 1000 MG suppository Place 1 suppository (1,000 mg total) rectally at bedtime. (Patient not taking: Reported on 05/18/2024) 60 suppository 1   No current facility-administered  medications for this visit.   Allergies  Allergen Reactions   Morphine  Itching   Other     Fresh pineapple, insect bites   Oxycodone  Itching   Physical Exam: BP 96/68 (BP Location: Left Arm, Patient Position: Sitting, Cuff Size: Normal)   Pulse 68   Ht 5\' 4"  (1.626 m) Comment: height measured without shoes  Wt 154 lb (69.9 kg)   LMP 04/21/2024   BMI 26.43 kg/m  Constitutional: Pleasant,well-developed, female in no acute distress. HEENT: Normocephalic and atraumatic. Conjunctivae are normal. No scleral icterus. Cardiovascular: Normal rate, regular rhythm.  Pulmonary/chest: Effort normal and breath sounds normal. No wheezing, rales or rhonchi. Abdominal:  Soft, nondistended, nontender. Bowel sounds active throughout.  Extremities: No edema Neurological: Alert and oriented to person place and time. Skin: Skin is warm and dry. No rashes noted. Psychiatric: Normal mood and affect. Behavior is normal.  Labs 01/2023: CBC with mildly elevated relative eosinophils of 5.2%.  CMP normal.  TSH normal.  CRP normal.  Vitamin B12 and folate normal.  Iron saturation low at 13.4%.  Iron low at 40.  CT A/P w/contrast 11/26/15: IMPRESSION: Minimal ascites. Possible subtle gallbladder wall thickening likely secondary to the mild ascites. Mild nonspecific periportal edema.  RUQ U/S 11/26/15: IMPRESSION: No gallstones are noted within gallbladder. Normal CBD. No thickening of gallbladder wall. Small pericholecystic fluid.  CT A/P w/contrast 03/11/23: IMPRESSION: 1. No acute findings within the abdomen or pelvis. No evidence of inflammatory bowel disease. 2. Moderate colonic stool burden  Colonoscopy 11/29/15: Quality of bowel preparation was excellent. Diffuse area of granular and mildly friable mucosa was found in the rectum, sigmoid colon, descending colon, transverse colon, and mid-ascending colon. Cecum and proximal ascending colon were spared. There was also some discontinuity of inflammation in the rectum. Findings are suspicious for UC. Biopsies were taken with forceps. Path: Chronic active colitis. No dysplasia or carcinoma. Biopsies consists of fragments of chronic colitis. Ulcerative colitis is favored.  EGD 11/29/15: Normal esophagus, stomach, and duodenum.   Colonoscopy 04/18/23: - The examined portion of the ileum was normal. Biopsied. - One 3 mm polyp in the transverse colon, removed with a cold snare. Resected and retrieved. - Localized inflammation was found in the rectum. - Biopsies for surveillance were taken from the right colon, left colon, transverse colon and rectum. Path: 1. Surgical [P], small bowel, ileal bx . SMALL INTESTINAL  MUCOSA WITH NO SIGNIFICANT PATHOLOGY. 2. Surgical [P], right colon bx - COLONIC MUCOSA WITH A PROMINENT LYMPHOID AGGREGATE AND MINIMAL ARCHITECTURAL DISARRAY. 3. Surgical [P], colon, transverse polyp x1, polyp (1) - INFLAMMATORY/GRANULATION TISSUE TYPE POLYP 4. Surgical [P], transverse colon bx - COLONIC MUCOSA WITH A PROMINENT LYMPHOID AGGREGATE AND FOCAL MINIMAL ARCHITECTURAL DISARRAY, OTHERWISE NO SIGNIFICANT PATHOLOGY. 5. Surgical [P], left colon bx - COLONIC MUCOSA WITH MINIMAL ARCHITECTURAL DISARRAY AND A SMALL LYMPHOID AGGREGATE, OTHERWISE NO SIGNIFICANT PATHOLOGY. 6. Surgical [P], colon, rectal bx - COLONIC MUCOSA WITH FOCAL MILD ACTIVITY, DENSE LAMINA PROPRIA LYMPHOPLASMACYTOSIS AND ARCHITECTURAL DISTORTION. NOTE: THE FINDINGS IN THE RECTUM ARE CONSISTENT WITH CHRONIC MILDLY ACTIVE COLITIS AND THE CLINICAL HISTORY OF ULCERATIVE COLITIS. THE OTHER BIOPSIES SHOW MINIMAL ARCHITECTURAL DISARRAY SUGGESTIVE OF AREAS OF CHRONIC INACTIVE COLITIS. THERE IS NO EVIDENCE OF GRANULOMAS, MORPHOLOGIC EVIDENCE OF VIRAL CYTOPATHIC EFFECT AND DYSPLASIA.  ASSESSMENT AND PLAN: Ulcerative colitis Rectal bleeding - resolved Iron deficiency without anemia GERD Patient did not have full improvement in her ulcerative colitis symptoms on mesalamine  suppositories.  She appears to be doing well on oral mesalamine  alone.  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  Patient does describe some acid reflux today so we will have her start Pepcid 20 mg as needed since her symptoms seem relatively mild.  Will update her labs today as well.  As long as patient continues to do well symptomatically, we will plan for repeat colonoscopy in 6 months on oral mesalamine . - Check CBC, CMP, CRP, vitamin B12, vitamin D, folate, ferritin/IBC - Cont oral mesalamine  4.8 g every day - Okay to hold off on mesalamine  suppositories - Start Pepcid 20 mg BID PRN for acid reflux - RTC 6  months.  Will plan for colonoscopy in 6 months  Regino Caprio, MD  I spent 32 minutes of time, including in depth chart review, independent review of results as outlined above, communicating results with the patient directly, face-to-face time with the patient, coordinating care, ordering studies and medications as appropriate, and documentation.

## 2024-05-18 NOTE — Patient Instructions (Addendum)
 Your provider has requested that you go to the basement level for lab work before leaving today. Press "B" on the elevator. The lab is located at the first door on the left as you exit the elevator.  Please purchase the following medications over the counter and take as directed: Pepcid 20 mg times daily  Follow up in 6 months   If your blood pressure at your visit was 140/90 or greater, please contact your primary care physician to follow up on this.  _______________________________________________________  If you are age 26 or older, your body mass index should be between 23-30. Your Body mass index is 26.43 kg/m. If this is out of the aforementioned range listed, please consider follow up with your Primary Care Provider.  If you are age 72 or younger, your body mass index should be between 19-25. Your Body mass index is 26.43 kg/m. If this is out of the aformentioned range listed, please consider follow up with your Primary Care Provider.   ________________________________________________________  The [place] [facility] providers would like to encourage you to use MYCHART to communicate with providers for non-urgent requests or questions.  Due to long hold times on the telephone, sending your provider a message by [facility] may be a faster and more efficient way to get a response.  Please allow 48 business hours for a response.  Please remember that this is for non-urgent requests.  _______________________________________________________  Due to recent changes in healthcare laws, you may see the results of your imaging and laboratory studies on MyChart before your provider has had a chance to review them.  We understand that in some cases there may be results that are confusing or concerning to you. Not all laboratory results come back in the same time frame and the provider may be waiting for multiple results in order to interpret others.  Please give us  48 hours in order for your provider to  thoroughly review all the results before contacting the office for clarification of your results.    Thank you for entrusting me with your care and for choosing [facility], Dr. Regino Caprio

## 2024-05-19 ENCOUNTER — Ambulatory Visit: Payer: Self-pay | Admitting: Internal Medicine

## 2024-05-19 ENCOUNTER — Other Ambulatory Visit: Payer: Self-pay | Admitting: Internal Medicine

## 2024-05-19 ENCOUNTER — Other Ambulatory Visit: Payer: Self-pay

## 2024-05-19 DIAGNOSIS — E559 Vitamin D deficiency, unspecified: Secondary | ICD-10-CM

## 2024-05-19 LAB — IBC + FERRITIN
Ferritin: 93.6 ng/mL (ref 10.0–291.0)
Iron: 70 ug/dL (ref 42–145)
Saturation Ratios: 22.6 % (ref 20.0–50.0)
TIBC: 309.4 ug/dL (ref 250.0–450.0)
Transferrin: 221 mg/dL (ref 212.0–360.0)

## 2024-05-19 LAB — VITAMIN D 25 HYDROXY (VIT D DEFICIENCY, FRACTURES): VITD: 25.48 ng/mL — ABNORMAL LOW (ref 30.00–100.00)

## 2024-05-19 LAB — FOLATE: Folate: 19.7 ng/mL (ref >5.9)

## 2024-05-19 LAB — VITAMIN B12: Vitamin B-12: 1202 pg/mL — ABNORMAL HIGH (ref 211–911)

## 2024-05-19 MED ORDER — VITAMIN D (ERGOCALCIFEROL) 1.25 MG (50000 UNIT) PO CAPS: 50000.0000 [IU] | ORAL_CAPSULE | ORAL | 2 refills | Status: DC

## 2024-07-01 ENCOUNTER — Other Ambulatory Visit (INDEPENDENT_AMBULATORY_CARE_PROVIDER_SITE_OTHER)

## 2024-07-01 ENCOUNTER — Ambulatory Visit: Payer: Self-pay | Admitting: Internal Medicine

## 2024-07-01 DIAGNOSIS — E559 Vitamin D deficiency, unspecified: Secondary | ICD-10-CM

## 2024-07-01 LAB — VITAMIN D 25 HYDROXY (VIT D DEFICIENCY, FRACTURES): VITD: 37.94 ng/mL (ref 30.00–100.00)

## 2024-07-14 ENCOUNTER — Other Ambulatory Visit: Payer: Self-pay

## 2024-07-14 ENCOUNTER — Telehealth: Payer: Self-pay | Admitting: Internal Medicine

## 2024-07-14 DIAGNOSIS — R197 Diarrhea, unspecified: Secondary | ICD-10-CM

## 2024-07-14 DIAGNOSIS — R109 Unspecified abdominal pain: Secondary | ICD-10-CM

## 2024-07-14 DIAGNOSIS — K51911 Ulcerative colitis, unspecified with rectal bleeding: Secondary | ICD-10-CM

## 2024-07-14 DIAGNOSIS — K625 Hemorrhage of anus and rectum: Secondary | ICD-10-CM

## 2024-07-14 NOTE — Telephone Encounter (Signed)
 Pt made aware of Dr Federico recommendations.  Orders for labs placed in Epic. Pt made aware.  Pt made aware to pick up stool specimen kit on the 2nd floor and in the basement.  Pt scheduled to see Dr. Federico on 07/30/2024 at 9:30. Pt made aware.  Pt verbalized understanding with all questions answered.

## 2024-07-14 NOTE — Telephone Encounter (Signed)
 Inbound call from patient states she is having a flare up, requesting to speak with a nurse. Please advise.   Thank you

## 2024-07-14 NOTE — Telephone Encounter (Signed)
 Pt stated that she has been having symptoms for a bout 2 and a half weeks of abdominal cramping, sense of urgency with BM, not emptying completely, having BM with blood , having 3 or 4 loose stools a day. Pt stated that she is taking the 4.8 of the mesalamine  daily.  Please review and advise

## 2024-07-14 NOTE — Telephone Encounter (Signed)
 Dr Federico pt will send to [facility]

## 2024-07-15 ENCOUNTER — Other Ambulatory Visit (INDEPENDENT_AMBULATORY_CARE_PROVIDER_SITE_OTHER)

## 2024-07-15 ENCOUNTER — Ambulatory Visit: Payer: Self-pay | Admitting: Internal Medicine

## 2024-07-15 DIAGNOSIS — K625 Hemorrhage of anus and rectum: Secondary | ICD-10-CM

## 2024-07-15 DIAGNOSIS — K51911 Ulcerative colitis, unspecified with rectal bleeding: Secondary | ICD-10-CM | POA: Diagnosis not present

## 2024-07-15 DIAGNOSIS — R109 Unspecified abdominal pain: Secondary | ICD-10-CM | POA: Diagnosis not present

## 2024-07-15 DIAGNOSIS — R197 Diarrhea, unspecified: Secondary | ICD-10-CM

## 2024-07-15 LAB — CBC WITH DIFFERENTIAL/PLATELET
Basophils Absolute: 0 K/uL (ref 0.0–0.1)
Basophils Relative: 0.6 % (ref 0.0–3.0)
Eosinophils Absolute: 0.7 K/uL (ref 0.0–0.7)
Eosinophils Relative: 9.6 % — ABNORMAL HIGH (ref 0.0–5.0)
HCT: 39.1 % (ref 36.0–46.0)
Hemoglobin: 12.8 g/dL (ref 12.0–15.0)
Lymphocytes Relative: 21.7 % (ref 12.0–46.0)
Lymphs Abs: 1.5 K/uL (ref 0.7–4.0)
MCHC: 32.7 g/dL (ref 30.0–36.0)
MCV: 86.7 fl (ref 78.0–100.0)
Monocytes Absolute: 0.8 K/uL (ref 0.1–1.0)
Monocytes Relative: 11.7 % (ref 3.0–12.0)
Neutro Abs: 3.9 K/uL (ref 1.4–7.7)
Neutrophils Relative %: 56.4 % (ref 43.0–77.0)
Platelets: 252 K/uL (ref 150.0–400.0)
RBC: 4.51 Mil/uL (ref 3.87–5.11)
RDW: 13.4 % (ref 11.5–15.5)
WBC: 7 K/uL (ref 4.0–10.5)

## 2024-07-15 LAB — BASIC METABOLIC PANEL WITH GFR
BUN: 15 mg/dL (ref 6–23)
CO2: 29 meq/L (ref 19–32)
Calcium: 9.7 mg/dL (ref 8.4–10.5)
Chloride: 101 meq/L (ref 96–112)
Creatinine, Ser: 0.77 mg/dL (ref 0.40–1.20)
GFR: 102.41 mL/min (ref >60.00)
Glucose, Bld: 69 mg/dL — ABNORMAL LOW (ref 70–99)
Potassium: 4.1 meq/L (ref 3.5–5.1)
Sodium: 138 meq/L (ref 135–145)

## 2024-07-15 LAB — C-REACTIVE PROTEIN: CRP: <1 mg/dL (ref 0.5–20.0)

## 2024-07-16 ENCOUNTER — Other Ambulatory Visit

## 2024-07-16 DIAGNOSIS — K625 Hemorrhage of anus and rectum: Secondary | ICD-10-CM

## 2024-07-16 DIAGNOSIS — R197 Diarrhea, unspecified: Secondary | ICD-10-CM

## 2024-07-16 DIAGNOSIS — R109 Unspecified abdominal pain: Secondary | ICD-10-CM

## 2024-07-16 DIAGNOSIS — K51911 Ulcerative colitis, unspecified with rectal bleeding: Secondary | ICD-10-CM

## 2024-07-19 LAB — CALPROTECTIN, FECAL: Calprotectin, Fecal: 1920 ug/g — ABNORMAL HIGH (ref 0–120)

## 2024-07-21 ENCOUNTER — Other Ambulatory Visit: Payer: Self-pay

## 2024-07-21 ENCOUNTER — Ambulatory Visit (INDEPENDENT_AMBULATORY_CARE_PROVIDER_SITE_OTHER): Admitting: Obstetrics and Gynecology

## 2024-07-21 ENCOUNTER — Other Ambulatory Visit (HOSPITAL_COMMUNITY)
Admission: RE | Admit: 2024-07-21 | Discharge: 2024-07-21 | Disposition: A | Discharge status: Home / Self Care | Source: Ambulatory Visit | Attending: Obstetrics and Gynecology | Admitting: Obstetrics and Gynecology

## 2024-07-21 ENCOUNTER — Encounter: Payer: Self-pay | Admitting: Obstetrics and Gynecology

## 2024-07-21 VITALS — BP 117/75 | HR 69 | Wt 150.5 lb

## 2024-07-21 DIAGNOSIS — Z1331 Encounter for screening for depression: Secondary | ICD-10-CM | POA: Diagnosis not present

## 2024-07-21 DIAGNOSIS — Z113 Encounter for screening for infections with a predominantly sexual mode of transmission: Secondary | ICD-10-CM | POA: Diagnosis not present

## 2024-07-21 DIAGNOSIS — Z9889 Other specified postprocedural states: Secondary | ICD-10-CM | POA: Diagnosis not present

## 2024-07-21 DIAGNOSIS — D069 Carcinoma in situ of cervix, unspecified: Secondary | ICD-10-CM

## 2024-07-21 DIAGNOSIS — Z124 Encounter for screening for malignant neoplasm of cervix: Secondary | ICD-10-CM

## 2024-07-21 NOTE — Progress Notes (Signed)
 GYNECOLOGY OFFICE NOTE  History:  34 y.o. [id number] here today for pap smear follow up.  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 No issues today.   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   qtrimster ucx  [x]  12/28: neg   Ulcerative colitis [facility])     Past Surgical History:  Procedure Laterality Date   CESAREAN SECTION N/A 04/21/2017   Procedure: CESAREAN SECTION;  Surgeon: Gloris DELENA Hugger, MD;  [facility]: [facility];  Service: Obstetrics;  Laterality: N/A;   CESAREAN SECTION N/A 11/05/2020   Procedure: CESAREAN SECTION;  Surgeon: Eveline Lynwood MATSU, MD;  Location: [facility];  Service: Obstetrics;  Laterality: N/A;   COLPOSCOPY W/ BIOPSY / CURETTAGE  07/26/2023   LEEP  08/22/2023   NO PAST SURGERIES       Current Outpatient Medications:    fluticasone (FLONASE) 50 MCG/ACT nasal spray, Place into both nostrils daily. Pt taking Flonase 2 a day, Disp: , Rfl:    levonorgestrel  (LILETTA , 52 MG,) 20.1 MCG/DAY IUD IUD, 1 each by Intrauterine route once., Disp: , Rfl:    mesalamine  (LIALDA ) 1.2 g EC tablet, Take 4 tablets (4.8 g total) by mouth daily with breakfast., Disp: 120 tablet, Rfl: 5   Multiple Vitamin (MULTIVITAMIN ADULT PO), Take by mouth., Disp: , Rfl:    triamcinolone (KENALOG) 0.1 %, APPLY TO AFFECTED AREAS TWICE A DAY X2 WEEKS THEN AS NEEDED FLARES, Disp: , Rfl:    Vitamin D , Ergocalciferol , (DRISDOL ) 1.25 MG (50000 UNIT) CAPS capsule, Take 1 capsule (50,000 Units total) by mouth every 7 (seven) days., Disp: 5 capsule, Rfl: 2  The  following portions of the patient's history were reviewed and updated as appropriate: allergies, current medications, past family history, past medical history, past social history, past surgical history and problem list.   Review of Systems:  Pertinent items noted in HPI and remainder of comprehensive ROS otherwise negative.   Objective:  Physical Exam BP 117/75   Pulse 69   Wt 150 lb 8 oz (68.3 kg)   LMP 06/24/2024 (Exact Date)   BMI 25.83 kg/m  CONSTITUTIONAL: Well-developed, well-nourished female in no acute distress.  HENT:  Normocephalic, atraumatic. External right and left ear normal. Oropharynx is clear and moist EYES: Conjunctivae and EOM are normal. Pupils are equal, round, and reactive to light. No scleral icterus.  NECK: Normal range of motion, supple, no masses SKIN: Skin is warm and dry. No rash noted. Not diaphoretic. No erythema. No pallor. NEUROLOGIC: Alert and oriented to person, place, and time. Normal reflexes, muscle tone coordination. No cranial nerve deficit noted. PSYCHIATRIC: Normal mood and affect. Normal behavior. Normal judgment and thought content. CARDIOVASCULAR: Normal heart rate noted RESPIRATORY: Effort normal, no problems with respiration noted ABDOMEN: Soft, no distention noted.   PELVIC: Normal appearing external genitalia; normal appearing vaginal mucosa and cervix.  Cervix with appearance of prior LEEP, endocervix noted. No abnormal discharge noted.  Pap smear obtained.  pelvic cultures obtained.  MUSCULOSKELETAL: Normal range of motion. No edema noted.  Exam done with chaperone present.  Labs and Imaging No results found.  Assessment & Plan:   1. Papanicolaou smear for cervical cancer screening (Primary) H/o LEEP - pap today, reviewed need for ongoing follow up even with normal pap  2. H/O LEEP  3. High grade squamous intraepithelial lesion (HGSIL), grade 3 CIN, on biopsy of cervix  4. Routine screening for STI (sexually transmitted  infection) GC/CT/trich on pap   Routine preventative health maintenance measures emphasized. Please refer to After Visit Summary for other counseling recommendations.   Return in about 1 year (around 07/21/2025) for pap.   LOIS Yolanda Moats, MD, [facility] Attending Center for [facility])

## 2024-07-24 LAB — CYTOLOGY - PAP
Chlamydia: NEGATIVE
Comment: NEGATIVE
Comment: NEGATIVE
Comment: NEGATIVE
Comment: NORMAL
Diagnosis: NEGATIVE
Diagnosis: REACTIVE
High risk HPV: NEGATIVE
Neisseria Gonorrhea: NEGATIVE
Trichomonas: NEGATIVE

## 2024-07-25 ENCOUNTER — Ambulatory Visit: Payer: Self-pay | Admitting: Family Medicine

## 2024-07-30 ENCOUNTER — Encounter: Payer: Self-pay | Admitting: Internal Medicine

## 2024-07-30 ENCOUNTER — Ambulatory Visit (INDEPENDENT_AMBULATORY_CARE_PROVIDER_SITE_OTHER): Admitting: Internal Medicine

## 2024-07-30 VITALS — BP 100/62 | HR 80 | Ht 64.0 in | Wt 155.5 lb

## 2024-07-30 DIAGNOSIS — K219 Gastro-esophageal reflux disease without esophagitis: Secondary | ICD-10-CM

## 2024-07-30 DIAGNOSIS — K625 Hemorrhage of anus and rectum: Secondary | ICD-10-CM

## 2024-07-30 DIAGNOSIS — K51911 Ulcerative colitis, unspecified with rectal bleeding: Secondary | ICD-10-CM | POA: Diagnosis not present

## 2024-07-30 DIAGNOSIS — E611 Iron deficiency: Secondary | ICD-10-CM | POA: Diagnosis not present

## 2024-07-30 DIAGNOSIS — R197 Diarrhea, unspecified: Secondary | ICD-10-CM

## 2024-07-30 MED ORDER — PREDNISONE 10 MG PO TABS: ORAL_TABLET | ORAL | 0 refills | Status: AC

## 2024-07-30 MED ORDER — MESALAMINE 1.2 G PO TBEC: 4.8000 g | DELAYED_RELEASE_TABLET | Freq: Every day | ORAL | 5 refills | Status: AC

## 2024-07-30 NOTE — Progress Notes (Signed)
 Chief Complaint: UC  HPI : 34 year old female with history of pan-UC, anemia, and headache presents for follow-up of UC   Interval History: She is currently in a UC flare. Endorses 2 BMs per day on average. She is seeing blood associated with her stools. The blood is bright in color. Endorses fecal urgency. Endorses nocturnal stools. She was taking her oral mesalamine  as prescribed prior to this episode. Endorses stomach cramping.  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   qtrimster ucx  [x]  12/28: neg   Ulcerative colitis [facility])    Past Surgical History:  Procedure Laterality Date   CESAREAN SECTION N/A 04/21/2017   Procedure: CESAREAN SECTION;  Surgeon: Gloris DELENA Hugger, MD;  [facility];  Service: Obstetrics;  Laterality: N/A;   CESAREAN SECTION N/A 11/05/2020   Procedure: CESAREAN SECTION;  Surgeon: Eveline Lynwood MATSU, MD;  Location: [facility];  Service: Obstetrics;  Laterality: N/A;   COLPOSCOPY W/ BIOPSY / CURETTAGE  07/26/2023   CROSS LINKING Bilateral    eyes   LEEP  08/22/2023   Family History  Problem Relation Age of Onset   Liver  disease Mother    Heart disease Father    Diabetes Father    Peripheral Artery Disease Father    Colon cancer Neg Hx    Esophageal cancer Neg Hx    Rectal cancer Neg Hx    Stomach cancer Neg Hx    Social History   Tobacco Use   Smoking status: Never   Smokeless tobacco: Never  Vaping Use   Vaping status: Never Used  Substance Use Topics   Alcohol use: No   Drug use: No   Current Outpatient Medications  Medication Sig Dispense Refill   Cholecalciferol (VITAMIN D3) 1000 units CAPS Take 1 capsule by mouth daily.     fluticasone (FLONASE) 50 MCG/ACT nasal spray Place into both nostrils daily. Pt taking Flonase 2 a day     levonorgestrel  (LILETTA , 52 MG,) 20.1 MCG/DAY IUD IUD 1 each by Intrauterine route once.     mesalamine  (LIALDA ) 1.2 g EC tablet Take 4 tablets (4.8 g total) by mouth daily with breakfast. 120 tablet 5   Multiple Vitamin (MULTIVITAMIN ADULT PO) Take by mouth.     triamcinolone (KENALOG) 0.1 % APPLY TO AFFECTED AREAS TWICE A DAY X2 WEEKS THEN AS NEEDED FLARES     No current facility-administered medications for this visit.   Allergies  Allergen Reactions   Morphine  Itching   Other     Fresh pineapple, insect bites   Oxycodone  Itching   Physical  Exam: BP 100/62 (BP Location: Left Arm, Patient Position: Sitting, Cuff Size: Normal)   Pulse 80   Ht 5' 4 (1.626 m)   Wt 155 lb 8 oz (70.5 kg)   LMP 06/24/2024 (Exact Date)   BMI 26.69 kg/m  Constitutional: Pleasant,well-developed, female in no acute distress. HEENT: Normocephalic and atraumatic. Conjunctivae are normal. No scleral icterus. Cardiovascular: Normal rate, regular rhythm.  Pulmonary/chest: Effort normal and breath sounds normal. No wheezing, rales or rhonchi. Abdominal: Soft, nondistended, nontender. Bowel sounds active throughout.  Extremities: No edema Neurological: Alert and oriented to person place and time. Skin: Skin is warm and dry. No rashes noted. Psychiatric: Normal mood and affect.  Behavior is normal.  Labs 01/2023: CBC with mildly elevated relative eosinophils of 5.2%.  CMP normal.  TSH normal.  CRP normal.  Vitamin B12 and folate normal.  Iron saturation low at 13.4%.  Iron low at 40.  Labs 04/2024: Vit D low at 25.48.  Vit B12 nml. Folate nml. Ferritin/IBC nml. CRP nml.   Labs 06/2024: CBC with elevated relative eosinophils of 9.6. BMP unremarkable. CRP nml. [facility] GI pathogen panel was negative. Fecal calprotectin elevated at 1920.   CT A/P w/contrast 11/26/15: IMPRESSION: Minimal ascites. Possible subtle gallbladder wall thickening likely secondary to the mild ascites. Mild nonspecific periportal edema.  RUQ U/S 11/26/15: IMPRESSION: No gallstones are noted within gallbladder. Normal CBD. No thickening of gallbladder wall. Small pericholecystic fluid.  CT A/P w/contrast 03/11/23: IMPRESSION: 1. No acute findings within the abdomen or pelvis. No evidence of inflammatory bowel disease. 2. Moderate colonic stool burden  Colonoscopy 11/29/15: Quality of bowel preparation was excellent. Diffuse area of granular and mildly friable mucosa was found in the rectum, sigmoid colon, descending colon, transverse colon, and mid-ascending colon. Cecum and proximal ascending colon were spared. There was also some discontinuity of inflammation in the rectum. Findings are suspicious for UC. Biopsies were taken with forceps. Path: Chronic active colitis. No dysplasia or carcinoma. Biopsies consists of fragments of chronic colitis. Ulcerative colitis is favored.  EGD 11/29/15: Normal esophagus, stomach, and duodenum.   Colonoscopy 04/18/23: - The examined portion of the ileum was normal. Biopsied. - One 3 mm polyp in the transverse colon, removed with a cold snare. Resected and retrieved. - Localized inflammation was found in the rectum. - Biopsies for surveillance were taken from the right colon, left colon, transverse colon and rectum. Path: 1. Surgical [P], small bowel,  ileal bx . SMALL INTESTINAL MUCOSA WITH NO SIGNIFICANT PATHOLOGY. 2. Surgical [P], right colon bx - COLONIC MUCOSA WITH A PROMINENT LYMPHOID AGGREGATE AND MINIMAL ARCHITECTURAL DISARRAY. 3. Surgical [P], colon, transverse polyp x1, polyp (1) - INFLAMMATORY/GRANULATION TISSUE TYPE POLYP 4. Surgical [P], transverse colon bx - COLONIC MUCOSA WITH A PROMINENT LYMPHOID AGGREGATE AND FOCAL MINIMAL ARCHITECTURAL DISARRAY, OTHERWISE NO SIGNIFICANT PATHOLOGY. 5. Surgical [P], left colon bx - COLONIC MUCOSA WITH MINIMAL ARCHITECTURAL DISARRAY AND A SMALL LYMPHOID AGGREGATE, OTHERWISE NO SIGNIFICANT PATHOLOGY. 6. Surgical [P], colon, rectal bx - COLONIC MUCOSA WITH FOCAL MILD ACTIVITY, DENSE LAMINA PROPRIA LYMPHOPLASMACYTOSIS AND ARCHITECTURAL DISTORTION. NOTE: THE FINDINGS IN THE RECTUM ARE CONSISTENT WITH CHRONIC MILDLY ACTIVE COLITIS AND THE CLINICAL HISTORY OF ULCERATIVE COLITIS. THE OTHER BIOPSIES SHOW MINIMAL ARCHITECTURAL DISARRAY SUGGESTIVE OF AREAS OF CHRONIC INACTIVE COLITIS. THERE IS NO EVIDENCE OF GRANULOMAS, MORPHOLOGIC EVIDENCE OF VIRAL CYTOPATHIC EFFECT AND DYSPLASIA.  ASSESSMENT AND PLAN: Ulcerative colitis Rectal bleeding Iron deficiency without anemia GERD Patient is currently in a flare of her UC with more frequent BMs and  rectal bleeding. Her fecal calprotectin was elevated, confirming a flare. GI pathogen panel was negative for infection. Will start on prednisone  therapy. I did discuss the idea that she may need more advanced therapies in the future, and patient would prefer to remain on oral mesalamine  for now if possible. Thus will see if the mesalamine  remains adequate to keep her under remission. - Start prednisone  40 mg daily. Taper down by 5 mg weekly until off - Cont oral mesalamine  4.8 g every day. Patient can restart her mesalamine  once she is at 20 mg daily of prednisone  - Continue Pepcid 20 mg BID PRN for acid reflux - RTC 2 months  Estefana Kidney, MD  I spent 36  minutes of time, including in depth chart review, independent review of results as outlined above, communicating results with the patient directly, face-to-face time with the patient, coordinating care, ordering studies and medications as appropriate, and documentation.

## 2024-07-30 NOTE — Patient Instructions (Addendum)
 We have sent the following medications to your pharmacy for you to pick up at your convenience:Prednisone   Once you are at 20 mg  of Prednisone  restart Mesalamine    _______________________________________________________  If your blood pressure at your visit was 140/90 or greater, please contact your primary care physician to follow up on this.  _______________________________________________________  If you are age 32 or older, your body mass index should be between 23-30. Your Body mass index is 26.69 kg/m. If this is out of the aforementioned range listed, please consider follow up with your Primary Care Provider.  If you are age 80 or younger, your body mass index should be between 19-25. Your Body mass index is 26.69 kg/m. If this is out of the aformentioned range listed, please consider follow up with your Primary Care Provider.   ________________________________________________________  The [facility] providers would like to encourage you to use MYCHART to communicate with providers for non-urgent requests or questions.  Due to long hold times on the telephone, sending your provider a message by [facility] may be a faster and more efficient way to get a response.  Please allow 48 business hours for a response.  Please remember that this is for non-urgent requests.  _______________________________________________________  [facility] is using a team-based approach to care.  Your team is made up of your doctor and two to three APPS. Our APPS (Nurse Practitioners and Physician Assistants) work with your physician to ensure care continuity for you. They are fully qualified to address your health concerns and develop a treatment plan. They communicate directly with your gastroenterologist to care for you. Seeing the Advanced Practice Practitioners on your physician's team can help you by facilitating care more promptly, often allowing for earlier appointments, access to diagnostic  testing, procedures, and other specialty referrals.   Thank you for entrusting me with your care and for choosing [facility], Dr. Estefana Kidney

## 2024-08-07 ENCOUNTER — Encounter: Payer: Self-pay | Admitting: Internal Medicine

## 2024-09-14 ENCOUNTER — Encounter: Payer: Self-pay | Admitting: Internal Medicine

## 2024-09-21 ENCOUNTER — Other Ambulatory Visit: Payer: Self-pay | Admitting: Internal Medicine

## 2024-09-29 ENCOUNTER — Ambulatory Visit: Admitting: Gastroenterology

## 2024-09-29 ENCOUNTER — Encounter: Payer: Self-pay | Admitting: Gastroenterology

## 2024-09-29 VITALS — BP 120/70 | HR 76 | Ht 64.0 in | Wt 161.4 lb

## 2024-09-29 DIAGNOSIS — K51911 Ulcerative colitis, unspecified with rectal bleeding: Secondary | ICD-10-CM

## 2024-09-29 DIAGNOSIS — K5900 Constipation, unspecified: Secondary | ICD-10-CM

## 2024-09-29 DIAGNOSIS — R110 Nausea: Secondary | ICD-10-CM | POA: Diagnosis not present

## 2024-09-29 MED ORDER — NA SULFATE-K SULFATE-MG SULF 17.5-3.13-1.6 GM/177ML PO SOLN
1.0000 | Freq: Once | ORAL | 0 refills | Status: AC
Start: 2024-09-29 — End: 2024-09-29

## 2024-09-29 NOTE — Progress Notes (Addendum)
 Chief Complaint:follow-up pan UC Primary GI Doctor: Dr. Federico  HPI:34 year old female with history of pan-UC, anemia, and headache presents for follow-up of UC. Last seen by Dr. Federico on 07/30/24 for UC flare. 2 Bms/day. Blood in stools. Fecal urgency. Started on prednisone  taper.  IBD Characteristics: Type: Ulcerative colitis Perianal involvement: No Location: Pan-colitis Diagnosed: 2016 Prior surgeries: None Extraintestinal manifestations: None Previous therapies: Canasa  suppositories (stopped after she was started on oral therapy) Current therapy: Lialda   Interval History      Patient presents for follow-up on Ulcerative colitis. Patient recently completed  prednisone  taper. She reports she is currently having one BM every 1-2 days. She takes OTC Miralax po daily. She reports the blood has resolved. The urgency has improved. She had one episode of urgency last night with mild nausea. She reports the nausea has been a chronic issue. No vomiting.  She denies any known triggers. She is currently on mesalamine  4.8 g every day. She tells me she takes in morning on empty stomach.     She reports she is not using the Pepcid for reflux, she controls it mostly with eating smaller meals.    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   qtrimster ucx  [x]  12/28: neg   Ulcerative colitis [facility])     Past Surgical History:  Procedure Laterality Date    CESAREAN SECTION N/A 04/21/2017   Procedure: CESAREAN SECTION;  Surgeon: Gloris DELENA Hugger, MD;  [facility];  Service: Obstetrics;  Laterality: N/A;   CESAREAN SECTION N/A 11/05/2020   Procedure: CESAREAN SECTION;  Surgeon: Eveline Lynwood MATSU, MD;  Location: [facility];  Service: Obstetrics;  Laterality: N/A;   COLPOSCOPY W/ BIOPSY / CURETTAGE  07/26/2023   CROSS LINKING Bilateral    eyes   LEEP  08/22/2023    Current Outpatient Medications  Medication Sig Dispense Refill   Cholecalciferol (VITAMIN D3) 1000 units CAPS Take 1 capsule by mouth daily.     fluticasone (FLONASE) 50 MCG/ACT nasal spray Place into both nostrils daily. Pt taking Flonase 2 a day     levonorgestrel  (LILETTA , 52 MG,) 20.1 MCG/DAY IUD IUD 1 each by Intrauterine route once.     mesalamine  (LIALDA ) 1.2 g EC tablet Take 4 tablets (4.8 g total) by mouth daily with breakfast. 120 tablet 5   Multiple Vitamin (MULTIVITAMIN ADULT PO) Take by mouth.     Na Sulfate-K Sulfate-Mg Sulfate concentrate (SUPREP) 17.5-3.13-1.6 GM/177ML SOLN Take 1 kit (354 mLs total) by mouth once for 1 dose. 354 mL 0   triamcinolone (KENALOG) 0.1 % APPLY TO AFFECTED AREAS TWICE A DAY X2 WEEKS THEN AS NEEDED FLARES     No current facility-administered medications for this visit.    Allergies as of 09/29/2024 - Review Complete 09/29/2024  Allergen Reaction Noted   Morphine  Itching 02/22/2023   Other  05/18/2024   Oxycodone  Itching 05/18/2024    Family History  Problem Relation Age of Onset   Liver disease Mother    Heart disease Father    Diabetes Father    Peripheral Artery Disease Father    Colon cancer Neg Hx    Esophageal cancer Neg Hx    Rectal cancer Neg Hx    Stomach cancer Neg Hx     Review of Systems:    Constitutional: No weight loss, fever, chills, weakness or fatigue HEENT: Eyes: No change in vision               Ears, Nose, Throat:  No change in hearing or congestion Skin: No rash or itching Cardiovascular:  No chest pain, chest pressure or palpitations   Respiratory: No SOB or cough Gastrointestinal: See HPI and otherwise negative Genitourinary: No dysuria or change in urinary frequency Neurological: No headache, dizziness or syncope Musculoskeletal: No new muscle or joint pain Hematologic: No bleeding or bruising Psychiatric: No history of depression or anxiety    Physical Exam:  Vital signs: BP 120/70 (BP Location: Left Arm, Patient Position: Sitting, Cuff Size: Normal)   Pulse 76   Ht 5' 4 (1.626 m)   Wt 161 lb 6 oz (73.2 kg)   LMP 09/25/2024   BMI 27.70 kg/m   Constitutional: Pleasant  female appears to be in NAD, Well developed, Well nourished, alert and cooperative Throat: Oral cavity and pharynx without inflammation, swelling or lesion.  Respiratory: Respirations even and unlabored. Lungs clear to auscultation bilaterally.   No wheezes, crackles, or rhonchi.  Cardiovascular: Normal S1, S2. Regular rate and rhythm. No peripheral edema, cyanosis or pallor.  Gastrointestinal:  Soft, nondistended, nontender. No rebound or guarding. Normal bowel sounds. No appreciable masses or hepatomegaly. Rectal:  Not performed.  Msk:  Symmetrical without gross deformities. Without edema, no deformity or joint abnormality.  Neurologic:  Alert and  oriented x4;  grossly normal neurologically.  Skin:   Dry and intact without significant lesions or rashes.  RELEVANT LABS AND IMAGING: CBC    Latest Ref Rng & Units 07/15/2024   11:07 AM 02/22/2023    3:18 PM 11/06/2020    4:26 AM  CBC  WBC 4.0 - 10.5 K/uL 7.0  6.0  13.2   Hemoglobin 12.0 - 15.0 g/dL 87.1  87.6  9.4   Hematocrit 36.0 - 46.0 % 39.1  37.5  29.2   Platelets 150.0 - 400.0 K/uL 252.0  231.0  170      CMP     Latest Ref Rng & Units 07/15/2024   11:07 AM 05/18/2024    3:29 PM 02/22/2023    3:18 PM  CMP  Glucose 70 - 99 mg/dL 69  75  75   BUN 6 - 23 mg/dL 15  15  20    Creatinine 0.40 - 1.20 mg/dL 9.22  9.36  9.26   Sodium 135 -  145 mEq/L 138  138  140   Potassium 3.5 - 5.1 mEq/L 4.1  3.8  3.6   Chloride 96 - 112 mEq/L 101  103  105   CO2 19 - 32 mEq/L 29  29  28    Calcium 8.4 - 10.5 mg/dL 9.7  9.1  9.1   Total Protein 6.0 - 8.3 g/dL  7.4  7.1   Total Bilirubin 0.2 - 1.2 mg/dL  0.5  0.5   Alkaline Phos 39 - 117  U/L  76  79   AST 0 - 37 U/L  19  22   ALT 0 - 35 U/L  15  13      Lab Results  Component Value Date   TSH 2.00 02/22/2023   Labs 01/2023: CBC with mildly elevated relative eosinophils of 5.2%.  CMP normal.  TSH normal.  CRP normal.  Vitamin B12 and folate normal.  Iron saturation low at 13.4%.  Iron low at 40.   Labs 04/2024: Vit D low at 25.48.  Vit B12 nml. Folate nml. Ferritin/IBC nml. CRP nml.    Labs 06/2024: CBC with elevated relative eosinophils of 9.6. BMP unremarkable. CRP nml. [facility] GI pathogen panel was negative. Fecal calprotectin elevated at 1920.    CT A/P w/contrast 11/26/15: IMPRESSION: Minimal ascites. Possible subtle gallbladder wall thickening likely secondary to the mild ascites. Mild nonspecific periportal edema.   RUQ U/S 11/26/15: IMPRESSION: No gallstones are noted within gallbladder. Normal CBD. No thickening of gallbladder wall. Small pericholecystic fluid.   CT A/P w/contrast 03/11/23: IMPRESSION: 1. No acute findings within the abdomen or pelvis. No evidence of inflammatory bowel disease. 2. Moderate colonic stool burden   Colonoscopy 11/29/15: Quality of bowel preparation was excellent. Diffuse area of granular and mildly friable mucosa was found in the rectum, sigmoid colon, descending colon, transverse colon, and mid-ascending colon. Cecum and proximal ascending colon were spared. There was also some discontinuity of inflammation in the rectum. Findings are suspicious for UC. Biopsies were taken with forceps. Path: Chronic active colitis. No dysplasia or carcinoma. Biopsies consists of fragments of chronic colitis. Ulcerative colitis is favored.   EGD 11/29/15:  Normal esophagus, stomach, and duodenum.    Colonoscopy 04/18/23: - The examined portion of the ileum was normal. Biopsied. - One 3 mm polyp in the transverse colon, removed with a cold snare. Resected and retrieved. - Localized inflammation was found in the rectum. - Biopsies for surveillance were taken from the right colon, left colon, transverse colon and rectum. Path: 1. Surgical [P], small bowel, ileal bx . SMALL INTESTINAL MUCOSA WITH NO SIGNIFICANT PATHOLOGY. 2. Surgical [P], right colon bx - COLONIC MUCOSA WITH A PROMINENT LYMPHOID AGGREGATE AND MINIMAL ARCHITECTURAL DISARRAY. 3. Surgical [P], colon, transverse polyp x1, polyp (1) - INFLAMMATORY/GRANULATION TISSUE TYPE POLYP 4. Surgical [P], transverse colon bx - COLONIC MUCOSA WITH A PROMINENT LYMPHOID AGGREGATE AND FOCAL MINIMAL ARCHITECTURAL DISARRAY, OTHERWISE NO SIGNIFICANT PATHOLOGY. 5. Surgical [P], left colon bx - COLONIC MUCOSA WITH MINIMAL ARCHITECTURAL DISARRAY AND A SMALL LYMPHOID AGGREGATE, OTHERWISE NO SIGNIFICANT PATHOLOGY. 6. Surgical [P], colon, rectal bx - COLONIC MUCOSA WITH FOCAL MILD ACTIVITY, DENSE LAMINA PROPRIA LYMPHOPLASMACYTOSIS AND ARCHITECTURAL DISTORTION. NOTE: THE FINDINGS IN THE RECTUM ARE CONSISTENT WITH CHRONIC MILDLY ACTIVE COLITIS AND THE CLINICAL HISTORY OF ULCERATIVE COLITIS. THE OTHER BIOPSIES SHOW MINIMAL ARCHITECTURAL DISARRAY SUGGESTIVE OF AREAS OF CHRONIC INACTIVE COLITIS. THERE IS NO EVIDENCE OF GRANULOMAS, MORPHOLOGIC EVIDENCE OF VIRAL CYTOPATHIC EFFECT AND DYSPLASIA.  Assessment: Encounter Diagnoses  Name Primary?   Ulcerative colitis with rectal bleeding, unspecified location [facility]) Yes   Chronic nausea    Constipation, unspecified constipation type     34 year old female patient with history of pan-UC, has been on mesalamine  4.8 grams since October 2024. Recently had flare of symptoms with rectal bleeding and urgency. Fecal calprotectin elevated at 1920. Given steroid taper and  symptoms have improved. Dr. Federico wanted follow-up colonoscopy 6 mths post mesalamine  treatment, will schedule with Dr. Suzann for November to evaluate for  mucosal healing.    Patient has chronic constipation that is managed with daily Miralax.   Patient complains of chronic nausea that seems to be worse with UC flares. Suggested she take the Lialda  with food to see if this helps. We discussed adding EGD to her colonoscopy, declined for now. Suggested OTC ginger chews for nausea, reports she doesn't like taste of ginger.   Plan: - continue mesalamine  4.8 grams po daily , take with food -offered endoscopy, declined - set up colonoscopy in [facility] with Dr. Suzann in November.The risks and benefits of colonoscopy with possible polypectomy / biopsies were discussed and the patient agrees to proceed.    Thank you for the courtesy of this consult. Please call me with any questions or concerns.   Danialle Dement, FNP-C [facility] 09/29/2024, 11:33 AM  Cc: No ref. provider found  I have reviewed the clinic note as outlined by Cathryne Beal, NP and agree with the assessment, plan and medical decision making.  Ms. Schechter has a history of pan ulcerative colitis diagnosed in 2016 on maintenance Lialda  4.8 g daily status post recent flare in summer 2025 treated with prednisone  taper.  Symptoms now appear to be in clinical remission.  She is followed by Dr. Federico who had requested a follow-up colonoscopy after initiation of mesalamine  therapy.  This is now scheduled for November 2025 for confirmation of mucosal healing.  Inocente Suzann, MD

## 2024-09-29 NOTE — Patient Instructions (Addendum)
 Continue mesalamine  4.8 grams po daily , take with food to see if this helps with nausea  If symptoms increase or return please call the office.  You have been scheduled for a colonoscopy. Please follow written instructions given to you at your visit today.   If you use inhalers (even only as needed), please bring them with you on the day of your procedure.  DO NOT TAKE 7 DAYS PRIOR TO TEST- Trulicity (dulaglutide) Ozempic, Wegovy (semaglutide) Mounjaro (tirzepatide) Bydureon Bcise (exanatide extended release)  DO NOT TAKE 1 DAY PRIOR TO YOUR TEST Rybelsus (semaglutide) Adlyxin (lixisenatide) Victoza (liraglutide) Byetta (exanatide) ___________________________________________________________________________  Due to recent changes in healthcare laws, you may see the results of your imaging and laboratory studies on MyChart before your provider has had a chance to review them.  We understand that in some cases there may be results that are confusing or concerning to you. Not all laboratory results come back in the same time frame and the provider may be waiting for multiple results in order to interpret others.  Please give us  48 hours in order for your provider to thoroughly review all the results before contacting the office for clarification of your results.   _______________________________________________________  If your blood pressure at your visit was 140/90 or greater, please contact your primary care physician to follow up on this.  _______________________________________________________  If you are age 75 or older, your body mass index should be between 23-30. Your Body mass index is 27.7 kg/m. If this is out of the aforementioned range listed, please consider follow up with your Primary Care Provider.  If you are age 67 or younger, your body mass index should be between 19-25. Your Body mass index is 27.7 kg/m. If this is out of the aformentioned range listed, please  consider follow up with your Primary Care Provider.   ________________________________________________________  The [facility] providers would like to encourage you to use MYCHART to communicate with providers for non-urgent requests or questions.  Due to long hold times on the telephone, sending your provider a message by [facility] may be a faster and more efficient way to get a response.  Please allow 48 business hours for a response.  Please remember that this is for non-urgent requests.  _______________________________________________________  [facility] is using a team-based approach to care.  Your team is made up of your doctor and two to three APPS. Our APPS (Nurse Practitioners and Physician Assistants) work with your physician to ensure care continuity for you. They are fully qualified to address your health concerns and develop a treatment plan. They communicate directly with your gastroenterologist to care for you. Seeing the Advanced Practice Practitioners on your physician's team can help you by facilitating care more promptly, often allowing for earlier appointments, access to diagnostic testing, procedures, and other specialty referrals.   Thank you for trusting me with your gastrointestinal care. Deanna May, FNP-C

## 2024-10-27 ENCOUNTER — Encounter: Payer: Self-pay | Admitting: Pediatrics

## 2024-11-01 NOTE — Progress Notes (Unsigned)
 [facility] History and Physical   Primary Care Physician:  Patient, No Pcp Per   Reason for Procedure:  Disease activity reassessment of pan ulcerative colitis.  Plan:    Colonoscopy     HPI: Carolyn Blake is a 34 y.o. female undergoing colonoscopy for disease activity assessment of pan ulcerative colitis.  Patient was diagnosed with pan ulcerative colitis in 2016.  She has been managed with mesalamine  4.8 g daily since October 2024.  Recently had a flare of her IBD summer 2025 with fecal calprotectin 1920.  Completed steroid taper with resolution of symptoms.  She is followed by Dr. Federico and Dr. Federico requested a follow-up colonoscopy 6 months status post treatment to evaluate for mucosal healing.  Last colonoscopy was performed in April 2024.  Full surveillance biopsies were performed at that time and did not show any evidence of dysplasia.   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   qtrimster ucx  [x]  12/28: neg   Ulcerative colitis [facility])     Past Surgical History:  Procedure Laterality Date   CESAREAN SECTION N/A 04/21/2017   Procedure: CESAREAN SECTION;  Surgeon: Gloris DELENA Hugger, MD;  [facility];  Service: Obstetrics;  Laterality: N/A;   CESAREAN SECTION N/A 11/05/2020   Procedure: CESAREAN SECTION;  Surgeon: Eveline Lynwood MATSU, MD;  Location: [facility];  Service: Obstetrics;  Laterality: N/A;   COLPOSCOPY W/ BIOPSY / CURETTAGE  07/26/2023   CROSS LINKING Bilateral    eyes   LEEP  08/22/2023    Prior to Admission medications   Medication  Sig Start Date End Date Taking? Authorizing Provider  Cholecalciferol (VITAMIN D3) 1000 units CAPS Take 1 capsule by mouth daily.    [provider]  fluticasone (FLONASE) 50 MCG/ACT nasal spray Place into both nostrils daily. Pt taking Flonase 2 a day    [provider]  levonorgestrel  (LILETTA , 52 MG,) 20.1 MCG/DAY IUD IUD 1 each by Intrauterine route once. 11/05/20   [provider]  mesalamine  (LIALDA ) 1.2 g EC tablet Take 4 tablets (4.8 g total) by mouth daily with breakfast. 07/30/24   Federico Rosario BROCKS, MD  Multiple Vitamin (MULTIVITAMIN ADULT PO) Take by mouth.    [provider]  triamcinolone (KENALOG) 0.1 % APPLY TO AFFECTED AREAS TWICE A DAY X2 WEEKS THEN AS NEEDED FLARES 02/01/21   [provider]    Current Outpatient Medications  Medication Sig Dispense Refill   fluticasone (FLONASE) 50 MCG/ACT nasal spray Place into both nostrils daily. Pt taking Flonase 2 a day     Cholecalciferol (VITAMIN D3) 1000 units CAPS Take 1 capsule by mouth daily.     levonorgestrel  (LILETTA , 52 MG,) 20.1 MCG/DAY IUD IUD 1 each by Intrauterine route once.     mesalamine  (LIALDA ) 1.2 g EC tablet Take 4 tablets (4.8 g total) by mouth daily with breakfast. 120 tablet 5   Multiple Vitamin (MULTIVITAMIN ADULT PO) Take by mouth.     triamcinolone (KENALOG)  0.1 % APPLY TO AFFECTED AREAS TWICE A DAY X2 WEEKS THEN AS NEEDED FLARES     Current Facility-Administered Medications  Medication Dose Route Frequency Provider Last Rate Last Admin   0.9 %  sodium chloride  infusion  500 mL Intravenous Continuous Bernedette Auston, Inocente HERO, MD        Allergies as of 11/04/2024 - Review Complete 11/04/2024  Allergen Reaction Noted   Morphine  Itching 02/22/2023   Other  05/18/2024   Oxycodone  Itching 05/18/2024    Family History  Problem Relation Age of Onset   Liver disease Mother    Heart disease Father    Diabetes Father    Peripheral Artery Disease Father    Colon cancer Neg Hx     Esophageal cancer Neg Hx    Rectal cancer Neg Hx    Stomach cancer Neg Hx     Social History   Socioeconomic History   Marital status: Single    Spouse name: Not on file   Number of children: Not on file   Years of education: Not on file   Highest education level: Not on file  Occupational History   Not on file  Tobacco Use   Smoking status: Never   Smokeless tobacco: Never  Vaping Use   Vaping status: Never Used  Substance and Sexual Activity   Alcohol use: No   Drug use: No   Sexual activity: Yes    Birth control/protection: None  Other Topics Concern   Not on file  Social History Narrative   Not on file   Social Drivers of Health   Financial Resource Strain: Not on file  Food Insecurity: No Food Insecurity (07/21/2024)   Hunger Vital Sign    Worried About Running Out of Food in the Last Year: Never true    Ran Out of Food in the Last Year: Never true  Transportation Needs: No Transportation Needs (07/21/2024)   PRAPARE - Administrator, Civil Service (Medical): No    Lack of Transportation (Non-Medical): No  Physical Activity: Not on file  Stress: Not on file  Social Connections: Not on file  Intimate Partner Violence: Not on file    Review of Systems:  All other review of systems negative except as mentioned in the HPI.  Physical Exam: Vital signs BP 126/85   Pulse 83   Temp 97.8 F (36.6 C) (Temporal)   Resp 16   Ht 5' 4 (1.626 m)   Wt 161 lb (73 kg)   SpO2 99%   BMI 27.64 kg/m   General:   Alert,  Well-developed, well-nourished, pleasant and cooperative in NAD Airway:  Mallampati 2 Lungs:  Clear throughout to auscultation.   Heart:  Regular rate and rhythm; no murmurs, clicks, rubs,  or gallops. Abdomen:  Soft, nontender and nondistended. Normal bowel sounds.   Neuro/Psych:  Normal mood and affect. A and O x 3  Inocente Hausen, MD [facility]

## 2024-11-04 ENCOUNTER — Ambulatory Visit (AMBULATORY_SURGERY_CENTER): Admitting: Pediatrics

## 2024-11-04 ENCOUNTER — Encounter: Payer: Self-pay | Admitting: Pediatrics

## 2024-11-04 VITALS — BP 118/79 | HR 80 | Temp 97.8°F | Resp 14 | Ht 64.0 in | Wt 161.0 lb

## 2024-11-04 DIAGNOSIS — K635 Polyp of colon: Secondary | ICD-10-CM

## 2024-11-04 DIAGNOSIS — K5190 Ulcerative colitis, unspecified, without complications: Secondary | ICD-10-CM | POA: Diagnosis not present

## 2024-11-04 DIAGNOSIS — D124 Benign neoplasm of descending colon: Secondary | ICD-10-CM

## 2024-11-04 DIAGNOSIS — K51911 Ulcerative colitis, unspecified with rectal bleeding: Secondary | ICD-10-CM

## 2024-11-04 DIAGNOSIS — K648 Other hemorrhoids: Secondary | ICD-10-CM | POA: Diagnosis not present

## 2024-11-04 MED ORDER — SODIUM CHLORIDE 0.9 % IV SOLN: 500.0000 mL | INTRAVENOUS | Status: DC

## 2024-11-04 NOTE — Progress Notes (Signed)
 Report given to PACU, vss

## 2024-11-04 NOTE — Progress Notes (Signed)
 1415 Data will not transfer from monitor, vs being posted manually.

## 2024-11-04 NOTE — Op Note (Addendum)
 [facility] Patient Name: Carolyn Blake Procedure Date: 11/04/2024 2:12 PM MRN: [id number] Endoscopist: Inocente Hausen , MD, [id number] Age: 34 Referring MD:  Date of Birth: 12-Dec-1990 Gender: Female Account #: [id number] Procedure:                Colonoscopy Indications:              Last colonoscopy: April 2024, Disease activity                            assessment of chronic ulcerative pancolitis, Assess                            therapeutic response to therapy of chronic                            ulcerative pancolitis; pan ulcerative colitis                            diagnosed 2016. Patient is on maintenance Lialda                             4.8 g daily. Incurred recent flare summer 2025                            status post course of prednisone . Colonoscopy is                            performed today to assess for mucosal healing. Medicines:                Monitored Anesthesia Care Procedure:                Pre-Anesthesia Assessment:                           - Prior to the procedure, a History and Physical                            was performed, and patient medications and                            allergies were reviewed. The patient's tolerance of                            previous anesthesia was also reviewed. The risks                            and benefits of the procedure and the sedation                            options and risks were discussed with the patient.                            All questions were answered, and informed consent  was obtained. Prior Anticoagulants: The patient has                            taken no anticoagulant or antiplatelet agents. ASA                            Grade Assessment: II - A patient with mild systemic                            disease. After reviewing the risks and benefits,                            the patient was deemed in satisfactory condition to                             undergo the procedure.                           After obtaining informed consent, the colonoscope                            was passed under direct vision. Throughout the                            procedure, the patient's blood pressure, pulse, and                            oxygen saturations were monitored continuously. The                            Olympus Scope [id number] was introduced through the                            anus and advanced to the terminal ileum. The                            colonoscopy was performed without difficulty. The                            patient tolerated the procedure well. The quality                            of the bowel preparation was good. The terminal                            ileum, ileocecal valve, appendiceal orifice, and                            rectum were photographed. Scope In: 2:22:37 PM Scope Out: 2:40:10 PM Scope Withdrawal Time: 0 hours 13 minutes 50 seconds  Total Procedure Duration: 0 hours 17 minutes 33 seconds  Findings:                 The perianal and digital rectal examinations were  normal. Pertinent negatives include normal                            sphincter tone and no palpable rectal lesions.                           Inflammation was not found based on the endoscopic                            appearance of the mucosa in the colon. This was                            graded as Mayo Score 0 (normal or inactive                            disease), and when compared to the previous                            examination, the findings are improved. Biopsies                            were taken with a cold forceps for histology.                           A 9 mm polyp was found in the descending colon. The                            polyp was flat. The polyp was removed with a cold                            snare. Resection and retrieval were complete.                           The  terminal ileum contained a single localized                            erosion. Biopsies were taken with a cold forceps                            for histology.                           Internal hemorrhoids were found during retroflexion. Complications:            No immediate complications. Estimated blood loss:                            Minimal. Estimated Blood Loss:     Estimated blood loss was minimal. Impression:               - Inactive (Mayo Score 0) ulcerative colitis, in                            remission, improved since the last examination.  Biopsied.                           - One 9 mm polyp in the descending colon, removed                            with a cold snare. Resected and retrieved.                           - A single erosion in the terminal ileum. Biopsied.                           - Internal hemorrhoids. Recommendation:           - Discharge patient to home (ambulatory).                           - Await pathology results.                           - Continue present medications.                           - The findings and recommendations were discussed                            with the patient's family.                           - Patient has a contact number available for                            emergencies. The signs and symptoms of potential                            delayed complications were discussed with the                            patient. Return to normal activities tomorrow.                            Written discharge instructions were provided to the                            patient. Inocente Hausen, MD 11/04/2024 2:48:00 PM This report has been signed electronically.

## 2024-11-04 NOTE — Patient Instructions (Signed)

## 2024-11-04 NOTE — Progress Notes (Signed)
 Called to room to assist during endoscopic procedure.  Patient ID and intended procedure confirmed with present staff. Received instructions for my participation in the procedure from the performing physician.

## 2024-11-04 NOTE — Progress Notes (Signed)
 Pt's states no medical or surgical changes since previsit or office visit.

## 2024-11-05 ENCOUNTER — Telehealth: Payer: Self-pay

## 2024-11-05 NOTE — Telephone Encounter (Signed)
 Follow up call to pt, no answer.

## 2024-11-09 ENCOUNTER — Ambulatory Visit: Payer: Self-pay | Admitting: Pediatrics

## 2024-11-09 LAB — SURGICAL PATHOLOGY

## 2025-01-15 ENCOUNTER — Ambulatory Visit: Admitting: Internal Medicine

## 2025-01-15 ENCOUNTER — Other Ambulatory Visit

## 2025-01-15 ENCOUNTER — Ambulatory Visit
Admission: RE | Admit: 2025-01-15 | Discharge: 2025-01-15 | Disposition: A | Discharge status: Home / Self Care | Source: Ambulatory Visit | Attending: Internal Medicine | Admitting: Internal Medicine

## 2025-01-15 ENCOUNTER — Encounter: Payer: Self-pay | Admitting: Internal Medicine

## 2025-01-15 VITALS — BP 116/74 | HR 92 | Ht 64.0 in | Wt 156.5 lb

## 2025-01-15 DIAGNOSIS — R152 Fecal urgency: Secondary | ICD-10-CM

## 2025-01-15 DIAGNOSIS — K5900 Constipation, unspecified: Secondary | ICD-10-CM | POA: Diagnosis not present

## 2025-01-15 DIAGNOSIS — K625 Hemorrhage of anus and rectum: Secondary | ICD-10-CM | POA: Diagnosis not present

## 2025-01-15 DIAGNOSIS — K51911 Ulcerative colitis, unspecified with rectal bleeding: Secondary | ICD-10-CM | POA: Diagnosis not present

## 2025-01-15 DIAGNOSIS — K5100 Ulcerative (chronic) pancolitis without complications: Secondary | ICD-10-CM

## 2025-01-15 NOTE — Patient Instructions (Signed)
 _______________________________________________________  If your blood pressure at your visit was 140/90 or greater, please contact your primary care physician to follow up on this.  _______________________________________________________  If you are age 33 or older, your body mass index should be between 23-30. Your Body mass index is 26.86 kg/m. If this is out of the aforementioned range listed, please consider follow up with your Primary Care Provider.  If you are age 74 or younger, your body mass index should be between 19-25. Your Body mass index is 26.86 kg/m. If this is out of the aformentioned range listed, please consider follow up with your Primary Care Provider.   ________________________________________________________  The [facility] providers would like to encourage you to use MYCHART to communicate with providers for non-urgent requests or questions.  Due to long hold times on the telephone, sending your provider a message by [facility] may be a faster and more efficient way to get a response.  Please allow 48 business hours for a response.  Please remember that this is for non-urgent requests.  _______________________________________________________  [facility] is using a team-based approach to care.  Your team is made up of your doctor and two to three APPS. Our APPS (Nurse Practitioners and Physician Assistants) work with your physician to ensure care continuity for you. They are fully qualified to address your health concerns and develop a treatment plan. They communicate directly with your gastroenterologist to care for you. Seeing the Advanced Practice Practitioners on your physician's team can help you by facilitating care more promptly, often allowing for earlier appointments, access to diagnostic testing, procedures, and other specialty referrals.   Your provider has requested that you have an abdominal x ray before leaving today. Please go to the basement floor  to our Radiology department for the test.  Your provider has requested that you go to the basement level for lab work before leaving today. Press B on the elevator. The lab is located at the first door on the left as you exit the elevator.  Your provider has ordered [facility] stool testing for you. You have received a kit from our office today containing all necessary supplies to complete this test. Please carefully read the stool collection instructions provided in the kit before opening the accompanying materials. In addition, be sure there is a label providing your full name and date of birth on the puritan opti-swab tube that is supplied in the kit (if you do not see a label with this information on your test tube, please make us  aware before test collection!). After completing the test, you should secure the purtian tube into the specimen biohazard bag. The [facility] E-Req sheet (including date and time of specimen collection) should be placed into the outside pocket of the specimen biohazard bag and returned to the [place] lab (basement floor of [facility]) within 2 days of collection. Please make sure to give the specimen to a staff member at the lab. DO NOT leave the specimen on the counter.   If the specimen date and time (can be found in the upper right boxed portion of the sheet) are not filled out on the E-Req sheet, the test will NOT be performed.   Please follow up in 6 months. Give us  a call at [phone] to schedule an appointment. Call in May for a July appointment  It was a pleasure to see you today!  Thank you for trusting me with your gastrointestinal care!

## 2025-01-15 NOTE — Progress Notes (Signed)
 "  Chief Complaint: follow-up pan UC  HPI: 35 year old female with history of pan-UC, anemia, and headache presents for follow-up of UC.  IBD Characteristics: Type: Ulcerative colitis Perianal involvement: No Location: Pan-colitis Diagnosed: 2016 Prior surgeries: None Extraintestinal manifestations: None Previous therapies: Canasa  suppositories (stopped after she was started on oral therapy), prednisone  (last prescribed in 06/2024 for a flare) Current therapy: Lialda   Interval History: Between the hours of 9 PM and 5 AM, every hour she feels the urge to poop. When she goes, there is nothing that comes out. Stomach seems more audible at night time as well. There is some blood in the BM, but not every time. Symptoms just restarted 2 weeks ago. Denies fevers. She used to have a stool 1-2 times day. She now has the urge to go 30 times per day but then only poops 1 time per day. Denies ab pain. This doesn't feel like a flare to her. Denies N&V. Denies joint pains. Her heartburn is now gone. She still takes Miralax once a day. She has been compliant with her Lialda  therapy.   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   qtrimster ucx  [x]  12/28: neg   Ulcerative colitis [facility])     Past Surgical History:  Procedure Laterality Date   CESAREAN SECTION N/A 04/21/2017   Procedure: CESAREAN SECTION;  Surgeon: Gloris DELENA Hugger, MD;  Location: [facility];  Service:  Obstetrics;  Laterality: N/A;   CESAREAN SECTION N/A 11/05/2020   Procedure: CESAREAN SECTION;  Surgeon: Eveline Lynwood MATSU, MD;  Location: [facility] LD ORS;  Service: Obstetrics;  Laterality: N/A;   COLPOSCOPY W/ BIOPSY / CURETTAGE  07/26/2023   CROSS LINKING Bilateral    eyes   LEEP  08/22/2023    Current Outpatient Medications  Medication Sig Dispense Refill   Cholecalciferol (VITAMIN D3) 1000 units CAPS Take 1 capsule by mouth daily.     fluticasone (FLONASE) 50 MCG/ACT nasal spray Place into both nostrils daily. Pt taking Flonase 2 a day     levonorgestrel  (LILETTA , 52 MG,) 20.1 MCG/DAY IUD IUD 1 each by Intrauterine route once.     mesalamine  (LIALDA ) 1.2 g EC tablet Take 4 tablets (4.8 g total) by mouth daily with breakfast. 120 tablet 5   Multiple Vitamin (MULTIVITAMIN ADULT PO) Take by mouth.     triamcinolone (KENALOG) 0.1 % APPLY TO AFFECTED AREAS TWICE A DAY X2 WEEKS THEN AS NEEDED FLARES     No current facility-administered medications for this visit.    Allergies as of 01/15/2025 - Review Complete 01/15/2025  Allergen Reaction Noted   Morphine  Itching 02/22/2023   Other  05/18/2024   Oxycodone  Itching 05/18/2024    Family History  Problem Relation Age of Onset   Liver disease Mother    Heart disease  Father    Diabetes Father    Peripheral Artery Disease Father    Breast cancer Paternal Grandmother    Colon cancer Neg Hx    Esophageal cancer Neg Hx    Rectal cancer Neg Hx    Stomach cancer Neg Hx     Review of Systems:    Constitutional: No weight loss, fever, chills, weakness or fatigue HEENT: Eyes: No change in vision               Ears, Nose, Throat:  No change in hearing or congestion Skin: No rash or itching Cardiovascular: No chest pain, chest pressure or palpitations   Respiratory: No SOB or cough Gastrointestinal: See HPI and otherwise negative Genitourinary: No dysuria or change in urinary frequency Neurological: No headache, dizziness or  syncope Musculoskeletal: No new muscle or joint pain Hematologic: No bleeding or bruising Psychiatric: No history of depression or anxiety    Physical Exam:  Vital signs: BP 116/74   Pulse 92   Ht 5' 4 (1.626 m)   Wt 156 lb 8 oz (71 kg)   BMI 26.86 kg/m   Constitutional: Pleasant  female appears to be in NAD, Well developed, Well nourished, alert and cooperative Throat: Oral cavity and pharynx without inflammation, swelling or lesion.  Respiratory: Respirations even and unlabored. Lungs clear to auscultation bilaterally.   No wheezes, crackles, or rhonchi.  Cardiovascular: Normal S1, S2. Regular rate and rhythm. No peripheral edema, cyanosis or pallor.  Gastrointestinal:  Soft, nondistended, nontender. No rebound or guarding. Normal bowel sounds. No appreciable masses or hepatomegaly. Rectal:  Not performed.  Msk:  Symmetrical without gross deformities. Without edema, no deformity or joint abnormality.  Neurologic:  Alert and  oriented x4;  grossly normal neurologically.  Skin:   Dry and intact without significant lesions or rashes.  RELEVANT LABS AND IMAGING: CBC    Latest Ref Rng & Units 07/15/2024   11:07 AM 02/22/2023    3:18 PM 11/06/2020    4:26 AM  CBC  WBC 4.0 - 10.5 K/uL 7.0  6.0  13.2   Hemoglobin 12.0 - 15.0 g/dL 87.1  87.6  9.4   Hematocrit 36.0 - 46.0 % 39.1  37.5  29.2   Platelets 150.0 - 400.0 K/uL 252.0  231.0  170      CMP     Latest Ref Rng & Units 07/15/2024   11:07 AM 05/18/2024    3:29 PM 02/22/2023    3:18 PM  CMP  Glucose 70 - 99 mg/dL 69  75  75   BUN 6 - 23 mg/dL 15  15  20    Creatinine 0.40 - 1.20 mg/dL 9.22  9.36  9.26   Sodium 135 - 145 mEq/L 138  138  140   Potassium 3.5 - 5.1 mEq/L 4.1  3.8  3.6   Chloride 96 - 112 mEq/L 101  103  105   CO2 19 - 32 mEq/L 29  29  28    Calcium 8.4 - 10.5 mg/dL 9.7  9.1  9.1   Total Protein 6.0 - 8.3 g/dL  7.4  7.1   Total Bilirubin 0.2 - 1.2 mg/dL  0.5  0.5   Alkaline Phos 39 - 117 U/L  76  79   AST 0 -  37 U/L  19  22   ALT 0 - 35 U/L  15  13      Lab Results  Component Value Date   TSH 2.00 02/22/2023   Labs  01/2023: CBC with mildly elevated relative eosinophils of 5.2%.  CMP normal.  TSH normal.  CRP normal.  Vitamin B12 and folate normal.  Iron saturation low at 13.4%.  Iron low at 40.   Labs 04/2024: Vit D low at 25.48.  Vit B12 nml. Folate nml. Ferritin/IBC nml. CRP nml.    Labs 06/2024: CBC with elevated relative eosinophils of 9.6. BMP unremarkable. CRP nml. [facility] GI pathogen panel was negative. Fecal calprotectin elevated at 1920.    CT A/P w/contrast 11/26/15: IMPRESSION: Minimal ascites. Possible subtle gallbladder wall thickening likely secondary to the mild ascites. Mild nonspecific periportal edema.   RUQ U/S 11/26/15: IMPRESSION: No gallstones are noted within gallbladder. Normal CBD. No thickening of gallbladder wall. Small pericholecystic fluid.   CT A/P w/contrast 03/11/23: IMPRESSION: 1. No acute findings within the abdomen or pelvis. No evidence of inflammatory bowel disease. 2. Moderate colonic stool burden   Colonoscopy 11/29/15: Quality of bowel preparation was excellent. Diffuse area of granular and mildly friable mucosa was found in the rectum, sigmoid colon, descending colon, transverse colon, and mid-ascending colon. Cecum and proximal ascending colon were spared. There was also some discontinuity of inflammation in the rectum. Findings are suspicious for UC. Biopsies were taken with forceps. Path: Chronic active colitis. No dysplasia or carcinoma. Biopsies consists of fragments of chronic colitis. Ulcerative colitis is favored.   EGD 11/29/15: Normal esophagus, stomach, and duodenum.    Colonoscopy 04/18/23: - The examined portion of the ileum was normal. Biopsied. - One 3 mm polyp in the transverse colon, removed with a cold snare. Resected and retrieved. - Localized inflammation was found in the rectum. - Biopsies for surveillance were taken from the  right colon, left colon, transverse colon and rectum. Path: 1. Surgical [P], small bowel, ileal bx . SMALL INTESTINAL MUCOSA WITH NO SIGNIFICANT PATHOLOGY. 2. Surgical [P], right colon bx - COLONIC MUCOSA WITH A PROMINENT LYMPHOID AGGREGATE AND MINIMAL ARCHITECTURAL DISARRAY. 3. Surgical [P], colon, transverse polyp x1, polyp (1) - INFLAMMATORY/GRANULATION TISSUE TYPE POLYP 4. Surgical [P], transverse colon bx - COLONIC MUCOSA WITH A PROMINENT LYMPHOID AGGREGATE AND FOCAL MINIMAL ARCHITECTURAL DISARRAY, OTHERWISE NO SIGNIFICANT PATHOLOGY. 5. Surgical [P], left colon bx - COLONIC MUCOSA WITH MINIMAL ARCHITECTURAL DISARRAY AND A SMALL LYMPHOID AGGREGATE, OTHERWISE NO SIGNIFICANT PATHOLOGY. 6. Surgical [P], colon, rectal bx - COLONIC MUCOSA WITH FOCAL MILD ACTIVITY, DENSE LAMINA PROPRIA LYMPHOPLASMACYTOSIS AND ARCHITECTURAL DISTORTION. NOTE: THE FINDINGS IN THE RECTUM ARE CONSISTENT WITH CHRONIC MILDLY ACTIVE COLITIS AND THE CLINICAL HISTORY OF ULCERATIVE COLITIS. THE OTHER BIOPSIES SHOW MINIMAL ARCHITECTURAL DISARRAY SUGGESTIVE OF AREAS OF CHRONIC INACTIVE COLITIS. THERE IS NO EVIDENCE OF GRANULOMAS, MORPHOLOGIC EVIDENCE OF VIRAL CYTOPATHIC EFFECT AND DYSPLASIA.  Colonoscopy 11/04/24: - Inactive ( Mayo Score 0) ulcerative colitis, in remission, improved since the last examination. Biopsied. - One 9 mm polyp in the descending colon, removed with a cold snare. Resected and retrieved. - A single erosion in the terminal ileum. Biopsied. - Internal hemorrhoids. Path: 1. Surgical [P], small bowel, terminal ileum :      -  SMALL INTESTINAL/TERMINAL ILEAL TYPE MUCOSA WITH LYMPHOID AGGREGATE/PEYER'S      PATCHES, OTHERWISE NO SIGNIFICANT PATHOLOGY      2. Surgical [P], right colon :      -  COLONIC MUCOSA WITH PROMINENT LYMPHOID AGGREGATES, OTHERWISE NO SIGNIFICANT      PATHOLOGY (NEGATIVE FOR DEFINITIVE EVIDENCE OF CHRONICITY, ACTIVITY, GRANULOMAS,      VIRAL CYTOPATHIC EFFECT AND DYSPLASIA).       3.  Surgical [P], left colon :      -  COLONIC MUCOSA WITH MILD ARCHITECTURAL DISARRAY SUGGESTIVE OF A CHRONIC      INACTIVE/QUIESCENT COLITIS IN THE CLINICAL HISTORY OF ULCERATIVE COLITIS.      -  NEGATIVE FOR ACTIVITY, GRANULOMAS, VIRAL CYTOPATHIC EFFECT AND DYSPLASIA.      4. Surgical [P], colon, descending, polyp (1) :      -  HYPERPLASTIC POLYP.   Assessment: Pan-UC Rectal bleeding Fecal urgency Constipation  Patient was doing well until 2 weeks ago when she started developing fecal urgency and some rectal bleeding. She had a flare in 06/2024 that was successfully treated with a prednisone  taper. Then in 10/2024 she had a colonoscopy that confirmed endoscopic/histologic remission while on oral mesalamine . Not clear to me what is causing her fecal urgency/rectal bleeding at this time. Could be a flare, infection, or perhaps constipation leading to hemorrhoidal bleeding. Will plan for further evaluation with infectious stool studies, fecal calprotectin, and KUB. Pending this evaluation, can decide on the next best step. If she does have an elevated fecal calprotectin but negative GI pathogen panel, then she may need her mesalamine  suppositories added back on.  Plan: - Check fecal calprotectin, [facility] GI pathogen panel - Check KUB to assess stool burden - Cont Lialda  - Cont Miralax for now - Next colonoscopy due in 2-3 years for surveillance - RTC 6 months  Estefana Kidney, MD [facility] 01/15/2025, 10:13 AM  I spent 34 minutes of time, including in depth chart review, independent review of results as outlined above, communicating results with the patient directly, face-to-face time with the patient, coordinating care, and ordering studies and medications as appropriate, and documentation.  "

## 2025-01-18 ENCOUNTER — Other Ambulatory Visit

## 2025-01-18 DIAGNOSIS — K51911 Ulcerative colitis, unspecified with rectal bleeding: Secondary | ICD-10-CM

## 2025-01-18 DIAGNOSIS — R152 Fecal urgency: Secondary | ICD-10-CM

## 2025-01-18 DIAGNOSIS — K625 Hemorrhage of anus and rectum: Secondary | ICD-10-CM

## 2025-01-21 ENCOUNTER — Encounter: Payer: Self-pay | Admitting: Internal Medicine

## 2025-01-21 ENCOUNTER — Ambulatory Visit: Payer: Self-pay | Admitting: Internal Medicine

## 2025-01-21 LAB — CALPROTECTIN, FECAL: Calprotectin, Fecal: 72 ug/g (ref 0–120)

## 2025-01-21 NOTE — Progress Notes (Signed)
 Pod B triage, please let the patient know that based upon her stool tests and her X-ray, I think that she is suffering from constipation as the source of her fecal urgency and rectal bleeding. Her fecal calprotectin test was negative for inflammation due to inflammatory bowel disease, and her infectious stool tests were negative for infection. Her X-ray showed a moderate amount of stool burden. I know she is already on Miralax once daily. Treatment options include: (1) bowel purge, (2) increase her Miralax temporarily to 2-3 times per day, or (3) start Linzess 145 mcg every day. Please see what the patient would prefer to do. Thanks.

## 2025-01-21 NOTE — Progress Notes (Signed)
 Received results of [facility] GI pathogen panel (collected 01/18/25), was negative for all infections.

## 2025-02-04 ENCOUNTER — Encounter: Payer: Self-pay | Admitting: Internal Medicine

## 2025-02-04 ENCOUNTER — Other Ambulatory Visit: Payer: Self-pay

## 2025-02-04 MED ORDER — LINACLOTIDE 145 MCG PO CAPS: 145.0000 ug | ORAL_CAPSULE | Freq: Every day | ORAL | 0 refills | Status: AC
